# Patient Record
Sex: Female | Born: 1985
Health system: Southern US, Community
[De-identification: ages and names within clinical notes are randomized; demographics above are authoritative.]

## PROBLEM LIST (undated history)

## (undated) DIAGNOSIS — G4700 Insomnia, unspecified: Secondary | ICD-10-CM

## (undated) DIAGNOSIS — K219 Gastro-esophageal reflux disease without esophagitis: Secondary | ICD-10-CM

## (undated) DIAGNOSIS — F909 Attention-deficit hyperactivity disorder, unspecified type: Secondary | ICD-10-CM

## (undated) DIAGNOSIS — R519 Headache, unspecified: Secondary | ICD-10-CM

## (undated) DIAGNOSIS — F32A Depression, unspecified: Secondary | ICD-10-CM

## (undated) DIAGNOSIS — J45909 Unspecified asthma, uncomplicated: Secondary | ICD-10-CM

## (undated) DIAGNOSIS — F419 Anxiety disorder, unspecified: Secondary | ICD-10-CM

## (undated) HISTORY — DX: Attention-deficit hyperactivity disorder, unspecified type: F90.9

## (undated) HISTORY — DX: Anxiety disorder, unspecified: F41.9

## (undated) HISTORY — PX: WISDOM TOOTH EXTRACTION: SHX21

## (undated) HISTORY — DX: Depression, unspecified: F32.A

## (undated) HISTORY — DX: Insomnia, unspecified: G47.00

## (undated) HISTORY — DX: Unspecified asthma, uncomplicated: J45.909

## (undated) HISTORY — PX: TONSILLECTOMY AND ADENOIDECTOMY: SUR1326

## (undated) HISTORY — PX: APPENDECTOMY: SHX54

---

## 2011-10-23 ENCOUNTER — Encounter: Payer: Self-pay | Admitting: *Deleted

## 2011-11-04 ENCOUNTER — Encounter: Payer: Self-pay | Admitting: Obstetrics & Gynecology

## 2011-11-04 ENCOUNTER — Ambulatory Visit (INDEPENDENT_AMBULATORY_CARE_PROVIDER_SITE_OTHER): Payer: 59 | Admitting: Obstetrics & Gynecology

## 2011-11-04 VITALS — BP 107/66 | Temp 98.0°F | Ht 61.0 in | Wt 107.0 lb

## 2011-11-04 DIAGNOSIS — Z113 Encounter for screening for infections with a predominantly sexual mode of transmission: Secondary | ICD-10-CM

## 2011-11-04 DIAGNOSIS — Z3480 Encounter for supervision of other normal pregnancy, unspecified trimester: Secondary | ICD-10-CM

## 2011-11-04 DIAGNOSIS — Z124 Encounter for screening for malignant neoplasm of cervix: Secondary | ICD-10-CM

## 2011-11-04 NOTE — Progress Notes (Signed)
   Subjective:    Annette Cook is a G2P1001 [redacted]w[redacted]d being seen today for her first obstetrical visit.  Her obstetrical history is significant for nothing. Patient does intend to breast feed. Pregnancy history fully reviewed.  Patient reports insomnia which she treats with Unisom and benadryl.[facility]eas[facility] Mons Vitals:   11/04/11 1520 11/04/11 1526  BP: 107/66   Temp: 98 F (36.7 C)   Height:  5\' 1"  (1.549 m)  Weight: 107 lb (48.535 kg)     HISTORY: OB History    Grav Para Term Preterm Abortions TAB SAB Ect Mult Living   2 1 1       1      # Outc Date GA Lbr Len/2nd Wgt Sex Del Anes PTL Lv   1 TRM 5/09 [redacted]w[redacted]d  7lb12oz(3.515kg) M SVD EPI No Yes   2 CUR              Past Medical History  Diagnosis Date  . ADHD (attention deficit hyperactivity disorder)   . Anxiety   . Insomnia    Past Surgical History  Procedure Date  . Appendectomy   . Wisdom tooth extraction   . Tonsillectomy and adenoidectomy    Family History  Problem Relation Age of Onset  . Hypertension Mother      Exam    Uterus:     Pelvic Exam:    Perineum: No Hemorrhoids   Vulva: normal   Vagina:  normal mucosa   pH:    Cervix: anteverted   Adnexa: normal adnexa   Bony Pelvis: android  System: Breast:  normal appearance, no masses or tenderness   Skin: normal coloration and turgor, no rashes    Neurologic: normal   Extremities: normal strength, tone, and muscle mass   HEENT PERRLA   Mouth/Teeth mucous membranes moist, pharynx normal without lesions   Neck supple   Cardiovascular: regular rate and rhythm   Respiratory:  appears well, vitals normal, no respiratory distress, acyanotic, normal RR, ear and throat exam is normal, neck free of mass or lymphadenopathy, chest clear, no wheezing, crepitations, rhonchi, normal symmetric air entry   Abdomen: soft, non-tender; bowel sounds normal; no masses,  no organomegaly   Urinary: urethral meatus normal  Bedside u/s shows a single pregnancy with a FHR 150. CRL  [id number]    Assessment:    Pregnancy: G2P1001 There is no problem list on file for this patient.       Plan:     Initial labs drawn. Prenatal vitamins. Problem list reviewed and updated. Genetic Screening discussed First Screen and Quad Screen: undecided.  Ultrasound discussed; fetal survey: requested.  Follow up in 4 weeks.   Basilio Meadow C. 11/04/2011

## 2011-11-04 NOTE — Progress Notes (Signed)
p-99  This is a 26 yo who is here with her husband.  They are excited about this pregnancy.  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

## 2011-11-05 LAB — OBSTETRIC PANEL
Antibody Screen: NEGATIVE
Basophils Relative: 0 % (ref 0–1)
HCT: 39.4 % (ref 36.0–46.0)
Hemoglobin: 13.4 g/dL (ref 12.0–15.0)
Lymphs Abs: 2.5 10*3/uL (ref 0.7–4.0)
MCH: 31.7 pg (ref 26.0–34.0)
MCHC: 34 g/dL (ref 30.0–36.0)
Monocytes Absolute: 0.6 10*3/uL (ref 0.1–1.0)
Monocytes Relative: 5 % (ref 3–12)
Neutro Abs: 7.6 10*3/uL (ref 1.7–7.7)
Rh Type: NEGATIVE

## 2011-11-06 LAB — URINE CULTURE: Organism ID, Bacteria: NO GROWTH

## 2011-11-09 ENCOUNTER — Encounter: Payer: Self-pay | Admitting: Obstetrics & Gynecology

## 2011-11-09 DIAGNOSIS — Z3480 Encounter for supervision of other normal pregnancy, unspecified trimester: Secondary | ICD-10-CM | POA: Insufficient documentation

## 2011-11-13 ENCOUNTER — Telehealth: Payer: Self-pay | Admitting: *Deleted

## 2011-11-13 DIAGNOSIS — Z3480 Encounter for supervision of other normal pregnancy, unspecified trimester: Secondary | ICD-10-CM

## 2011-11-13 MED ORDER — PRENEXA 30-1.24-265 MG PO CAPS: 1.0000 | ORAL_CAPSULE | Freq: Every day | ORAL | Status: DC

## 2011-11-13 NOTE — Telephone Encounter (Signed)
Pt called requesting a prenatal vitamin that is on the [employer] insurance plan.  RX for [place] sent to [facility].

## 2011-12-02 ENCOUNTER — Encounter: Payer: Self-pay | Admitting: Obstetrics & Gynecology

## 2011-12-02 ENCOUNTER — Ambulatory Visit (INDEPENDENT_AMBULATORY_CARE_PROVIDER_SITE_OTHER): Payer: 59 | Admitting: Obstetrics & Gynecology

## 2011-12-02 ENCOUNTER — Encounter: Payer: 59 | Admitting: Obstetrics & Gynecology

## 2011-12-02 VITALS — BP 114/72 | Temp 98.0°F | Wt 113.0 lb

## 2011-12-02 DIAGNOSIS — B009 Herpesviral infection, unspecified: Secondary | ICD-10-CM | POA: Insufficient documentation

## 2011-12-02 DIAGNOSIS — Z3480 Encounter for supervision of other normal pregnancy, unspecified trimester: Secondary | ICD-10-CM

## 2011-12-02 NOTE — Progress Notes (Signed)
p=101 

## 2011-12-02 NOTE — Progress Notes (Signed)
Routine visit. No problems. We discussed First screen and she declines it. We discussed rec weight gain.

## 2012-01-01 ENCOUNTER — Encounter: Payer: 59 | Admitting: Obstetrics & Gynecology

## 2012-01-06 ENCOUNTER — Encounter: Payer: Self-pay | Admitting: Obstetrics & Gynecology

## 2012-01-06 ENCOUNTER — Ambulatory Visit (INDEPENDENT_AMBULATORY_CARE_PROVIDER_SITE_OTHER): Payer: 59 | Admitting: Obstetrics & Gynecology

## 2012-01-06 VITALS — BP 109/67 | Temp 98.4°F | Wt 119.0 lb

## 2012-01-06 DIAGNOSIS — Z3480 Encounter for supervision of other normal pregnancy, unspecified trimester: Secondary | ICD-10-CM

## 2012-01-06 DIAGNOSIS — Z23 Encounter for immunization: Secondary | ICD-10-CM

## 2012-01-06 DIAGNOSIS — B009 Herpesviral infection, unspecified: Secondary | ICD-10-CM

## 2012-01-06 NOTE — Addendum Note (Signed)
Addended by: Granville Lewis on: 01/06/2012 02:40 PM   Modules accepted: Orders

## 2012-01-06 NOTE — Progress Notes (Signed)
p-81 

## 2012-01-06 NOTE — Progress Notes (Signed)
Routine visit. No OB problems. Flu shot today. She will get her anatomy u/s in 3 weeks and her Quad screen at next visit.

## 2012-01-30 ENCOUNTER — Ambulatory Visit (HOSPITAL_COMMUNITY)
Admission: RE | Admit: 2012-01-30 | Discharge: 2012-01-30 | Disposition: A | Discharge status: Home / Self Care | Payer: 59 | Source: Ambulatory Visit | Attending: Obstetrics & Gynecology | Admitting: Obstetrics & Gynecology

## 2012-01-30 DIAGNOSIS — Z3480 Encounter for supervision of other normal pregnancy, unspecified trimester: Secondary | ICD-10-CM | POA: Insufficient documentation

## 2012-02-03 ENCOUNTER — Ambulatory Visit (INDEPENDENT_AMBULATORY_CARE_PROVIDER_SITE_OTHER): Payer: 59 | Admitting: Obstetrics & Gynecology

## 2012-02-03 ENCOUNTER — Encounter: Payer: Self-pay | Admitting: Obstetrics & Gynecology

## 2012-02-03 VITALS — BP 104/62 | Temp 97.1°F | Wt 124.0 lb

## 2012-02-03 DIAGNOSIS — O360990 Maternal care for other rhesus isoimmunization, unspecified trimester, not applicable or unspecified: Secondary | ICD-10-CM

## 2012-02-03 DIAGNOSIS — Z6791 Unspecified blood type, Rh negative: Secondary | ICD-10-CM | POA: Insufficient documentation

## 2012-02-03 DIAGNOSIS — Z3480 Encounter for supervision of other normal pregnancy, unspecified trimester: Secondary | ICD-10-CM

## 2012-02-03 DIAGNOSIS — O4420 Partial placenta previa NOS or without hemorrhage, unspecified trimester: Secondary | ICD-10-CM | POA: Insufficient documentation

## 2012-02-03 DIAGNOSIS — O4410 Placenta previa with hemorrhage, unspecified trimester: Secondary | ICD-10-CM

## 2012-02-03 DIAGNOSIS — O26899 Other specified pregnancy related conditions, unspecified trimester: Secondary | ICD-10-CM

## 2012-02-03 NOTE — Patient Instructions (Signed)
Placenta Previa Placenta previa is a condition in which the placenta has grown low in the womb (uterus). This is a condition in which the organ which connects the fetus to the mother's uterus (placenta) is low in the opening in the uterus (cervix). It can partially or completely cover the cervix. The cause of this is unknown. It is more common with multiple births or twins. SYMPTOMS  The main symptom or sign of placenta previa is vaginal bleeding. The bleeding can be mild to very heavy. This condition can be very serious for the mother and baby. Often there are no symptoms with placenta previa. Sometimes if the location of the placenta is very low it will become partially detached and cause bleeding. This may be simply a marginal sinus separation of the placenta. This is a separation of the vessels from the wall of the uterus. This may cause no further problems other than mild anxiety. There is an increase risk of intrauterine growth restriction (IUGR) with placenta previa because of the abnormal placement of the placenta. DIAGNOSIS  The diagnosis is usually made by ultrasound exam of the uterus. There may be a careful vaginal exam to see the cervix. The patient will be prepared for a Cesarean section immediately if necessary. TREATMENT  Treatment for placenta previa is usually bed rest in the hospital or at home. You may be given medication to stop contractions. Contractions can increase bleeding. Your doctor may take fluid from the baby's sac (amniocentesis) to see if the baby's lungs are mature enough for a C-section. A blood transfusion may be necessary if you have a low blood count. No further treatment may be needed when placenta previa is present in small degrees. Early placenta previa may resolve on it's own. The placenta moves higher in the birth canal as pregnancy progresses. In this case the placenta no longer is an obstruction to birth. The position of the placenta may need to be reconfirmed  during pregnancy. This can be done with an ultrasound exam of the belly(abdomen). Call your caregiver immediately if blood loss is severe. Immediate fluid or blood replacement may be necessary. With complete placenta previa, the only way to safely deliver the baby is by Cesarean section. HOME CARE INSTRUCTIONS   Follow your caregiver's advice about bed rest.  Take any iron pills or other medications your doctor gives to you.  No bending or lifting.  Do not have sexual intercourse.  Do not put anything in your vagina (tampons or vaginal creams). If you are bleeding, use sanitary pads.  Keep your doctors appointments as scheduled. Not keeping the appointment could result in a chronic or permanent injury, pain, disability and injury or death to you or your unborn baby. If there is any problem keeping the appointment, you must call back to this facility for assistance. SEEK IMMEDIATE MEDICAL CARE IF:   You have increased bleeding.  You have fainting episodes or feel lightheaded.  You develop abdominal pain.  You can no longer feel normal fetal or baby movements.  You develop uterine contractions. Document Released: 03/31/2005 Document Revised: 06/23/2011 Document Reviewed: 11/12/2007 ExitCare Patient Information 2013 [employer].  

## 2012-02-03 NOTE — Progress Notes (Signed)
p=82 

## 2012-02-13 ENCOUNTER — Encounter: Payer: Self-pay | Admitting: Obstetrics & Gynecology

## 2012-02-16 ENCOUNTER — Telehealth: Payer: Self-pay | Admitting: *Deleted

## 2012-02-16 DIAGNOSIS — O4420 Partial placenta previa NOS or without hemorrhage, unspecified trimester: Secondary | ICD-10-CM

## 2012-02-16 NOTE — Telephone Encounter (Signed)
Pt scheduled for f/u prenatal ultrasound to reevaluate marginal previa.  Scheduled appt 03/31/12 @ 2:45 pm

## 2012-03-02 ENCOUNTER — Encounter: Payer: 59 | Admitting: Obstetrics & Gynecology

## 2012-03-05 ENCOUNTER — Ambulatory Visit (INDEPENDENT_AMBULATORY_CARE_PROVIDER_SITE_OTHER): Payer: 59 | Admitting: Advanced Practice Midwife

## 2012-03-05 VITALS — BP 100/62 | Temp 98.0°F | Wt 128.0 lb

## 2012-03-05 DIAGNOSIS — Z3480 Encounter for supervision of other normal pregnancy, unspecified trimester: Secondary | ICD-10-CM

## 2012-03-05 NOTE — Progress Notes (Signed)
p-79 

## 2012-03-06 LAB — ANTIBODY SCREEN: Antibody Screen: NEGATIVE

## 2012-03-06 LAB — CBC
MCV: 94 fL (ref 78.0–100.0)
Platelets: 188 10*3/uL (ref 150–400)
RBC: 3.48 MIL/uL — ABNORMAL LOW (ref 3.87–5.11)
WBC: 7.9 10*3/uL (ref 4.0–10.5)

## 2012-03-22 NOTE — Progress Notes (Signed)
Quad screen neg

## 2012-03-31 ENCOUNTER — Ambulatory Visit (HOSPITAL_COMMUNITY)
Admission: RE | Admit: 2012-03-31 | Discharge: 2012-03-31 | Disposition: A | Discharge status: Home / Self Care | Payer: 59 | Source: Ambulatory Visit | Attending: Obstetrics & Gynecology | Admitting: Obstetrics & Gynecology

## 2012-03-31 DIAGNOSIS — O4400 Placenta previa specified as without hemorrhage, unspecified trimester: Secondary | ICD-10-CM | POA: Insufficient documentation

## 2012-03-31 DIAGNOSIS — O4420 Partial placenta previa NOS or without hemorrhage, unspecified trimester: Secondary | ICD-10-CM

## 2012-04-01 ENCOUNTER — Other Ambulatory Visit: Payer: Self-pay | Admitting: Obstetrics & Gynecology

## 2012-04-01 DIAGNOSIS — O4420 Partial placenta previa NOS or without hemorrhage, unspecified trimester: Secondary | ICD-10-CM

## 2012-04-02 ENCOUNTER — Encounter: Payer: Self-pay | Admitting: Obstetrics & Gynecology

## 2012-04-09 ENCOUNTER — Ambulatory Visit (INDEPENDENT_AMBULATORY_CARE_PROVIDER_SITE_OTHER): Payer: 59 | Admitting: Family

## 2012-04-09 VITALS — BP 117/67 | Wt 133.0 lb

## 2012-04-09 DIAGNOSIS — Z6791 Unspecified blood type, Rh negative: Secondary | ICD-10-CM

## 2012-04-09 DIAGNOSIS — O361190 Maternal care for Anti-A sensitization, unspecified trimester, not applicable or unspecified: Secondary | ICD-10-CM

## 2012-04-09 DIAGNOSIS — O26899 Other specified pregnancy related conditions, unspecified trimester: Secondary | ICD-10-CM

## 2012-04-09 DIAGNOSIS — Z3480 Encounter for supervision of other normal pregnancy, unspecified trimester: Secondary | ICD-10-CM

## 2012-04-09 DIAGNOSIS — O360990 Maternal care for other rhesus isoimmunization, unspecified trimester, not applicable or unspecified: Secondary | ICD-10-CM

## 2012-04-09 MED ORDER — CYCLOBENZAPRINE HCL 10 MG PO TABS: 10.0000 mg | ORAL_TABLET | Freq: Three times a day (TID) | ORAL | Status: DC | PRN

## 2012-04-09 MED ORDER — RHO D IMMUNE GLOBULIN 1500 UNIT/2ML IJ SOLN
300.0000 ug | Freq: Once | INTRAMUSCULAR | Status: AC
  Administered 2012-04-09: 300 ug via INTRAMUSCULAR

## 2012-04-09 MED ORDER — ZOLPIDEM TARTRATE 10 MG PO TABS: 5.0000 mg | ORAL_TABLET | Freq: Every evening | ORAL | Status: DC | PRN

## 2012-04-09 NOTE — Progress Notes (Signed)
p=90 

## 2012-04-09 NOTE — Progress Notes (Signed)
Reviewed ultrasound with pt, posterior low-lying placenta 1.8 cm from os; advised another ultrasound recommended towards end of pregnancy to check position, pt prefers not to due to deductible with insurance; consulted with Dr. Penne Lash, strongly advise follow-up ultrasound at 36 wk with no intercourse until reassessment.  Reports lower back pain > recommended physical therapy, pt requested pharmaceutical agent > flexeril.  Also requested med to assist in insomnia > Ambien 5 mg, do not take both medications at the same time > pt verbalized understanding.  Rhogam today.

## 2012-04-14 NOTE — L&D Delivery Note (Signed)
Delivery Note After a short second stage, at 5:41 PM a viable female was delivered via Vaginal, Spontaneous Delivery (Presentation:ROA;  ).  APGAR: 9, 9; weight pending.   Placenta status: Intact, Spontaneous.  Cord: 3 vessels with the following complications: None.   Anesthesia: Epidural  Episiotomy: None Lacerations: 2nd degree;Vaginal Suture Repair: 2.0 vicryl Est. Blood Loss (mL): 300  Mom to postpartum.  Baby to nursery-stable  Delivery and repair done by Leandro Reasoner, SN[facility], under my supervision.  Annette Cook,Annette Cook 06/20/2012, 5:55 PM

## 2012-05-12 ENCOUNTER — Encounter: Payer: 59 | Admitting: Obstetrics & Gynecology

## 2012-05-20 ENCOUNTER — Ambulatory Visit (INDEPENDENT_AMBULATORY_CARE_PROVIDER_SITE_OTHER): Payer: 59 | Admitting: Obstetrics & Gynecology

## 2012-05-20 ENCOUNTER — Encounter: Payer: Self-pay | Admitting: Obstetrics & Gynecology

## 2012-05-20 VITALS — BP 117/73 | Wt 139.0 lb

## 2012-05-20 DIAGNOSIS — Z3480 Encounter for supervision of other normal pregnancy, unspecified trimester: Secondary | ICD-10-CM

## 2012-05-20 MED ORDER — ZOLPIDEM TARTRATE 5 MG PO TABS: 5.0000 mg | ORAL_TABLET | Freq: Every evening | ORAL | Status: DC | PRN

## 2012-05-20 NOTE — Progress Notes (Signed)
Routine visit. Good FM. No OB problems. She would like a refill of her Ambien. She is already taking her Valtrex daily. She will keep her next visit because she knows we need to do cervical cultures. Back exercises demonstrated/taught.

## 2012-05-20 NOTE — Progress Notes (Signed)
P-95 - Pt states feels like baby has turned and would like a scan to confirm

## 2012-05-20 NOTE — Addendum Note (Signed)
Addended by: Allie Bossier on: 05/20/2012 04:01 PM   Modules accepted: Orders

## 2012-05-24 ENCOUNTER — Other Ambulatory Visit: Payer: Self-pay | Admitting: Family

## 2012-06-04 ENCOUNTER — Ambulatory Visit (INDEPENDENT_AMBULATORY_CARE_PROVIDER_SITE_OTHER): Payer: 59 | Admitting: Family

## 2012-06-04 VITALS — BP 110/71 | Wt 138.0 lb

## 2012-06-04 DIAGNOSIS — Z3480 Encounter for supervision of other normal pregnancy, unspecified trimester: Secondary | ICD-10-CM

## 2012-06-04 DIAGNOSIS — O4420 Partial placenta previa NOS or without hemorrhage, unspecified trimester: Secondary | ICD-10-CM

## 2012-06-04 DIAGNOSIS — O4410 Placenta previa with hemorrhage, unspecified trimester: Secondary | ICD-10-CM

## 2012-06-04 NOTE — Progress Notes (Signed)
P-109 

## 2012-06-04 NOTE — Progress Notes (Signed)
No problems are concerned; discussed low lying placenta (1.8 cm from os) in early pregnancy, did not repeat scan due to insurance.  Consulted with Anyanwu >ok to attempt vaginal delivery.  No report of vaginal bleeding.  GC/CT and GBS collected today.  Pt reports taking valtrex since last visit.

## 2012-06-06 LAB — GC/CHLAMYDIA PROBE AMP: GC Probe RNA: NEGATIVE

## 2012-06-07 LAB — CULTURE, BETA STREP (GROUP B ONLY)

## 2012-06-08 ENCOUNTER — Encounter: Payer: Self-pay | Admitting: Family

## 2012-06-14 ENCOUNTER — Ambulatory Visit (INDEPENDENT_AMBULATORY_CARE_PROVIDER_SITE_OTHER): Payer: 59 | Admitting: Obstetrics and Gynecology

## 2012-06-14 ENCOUNTER — Encounter: Payer: Self-pay | Admitting: Obstetrics and Gynecology

## 2012-06-14 VITALS — BP 122/76 | Wt 141.0 lb

## 2012-06-14 DIAGNOSIS — Z3480 Encounter for supervision of other normal pregnancy, unspecified trimester: Secondary | ICD-10-CM

## 2012-06-14 DIAGNOSIS — R3129 Other microscopic hematuria: Secondary | ICD-10-CM

## 2012-06-14 NOTE — Patient Instructions (Signed)
Pregnancy - Third Trimester  The third trimester of pregnancy (the last 3 months) is a period of the most rapid growth for you and your baby. The baby approaches a length of 20 inches and a weight of 6 to 10 pounds. The baby is adding on fat and getting ready for life outside your body. While inside, babies have periods of sleeping and waking, suck their thumbs, and hiccups. You can often feel small contractions of the uterus. This is false labor. It is also called Braxton-Hicks contractions. This is like a practice for labor. The usual problems in this stage of pregnancy include more difficulty breathing, swelling of the hands and feet from water retention, and having to urinate more often because of the uterus and baby pressing on your bladder.   PRENATAL EXAMS  · Blood work may continue to be done during prenatal exams. These tests are done to check on your health and the probable health of your baby. Blood work is used to follow your blood levels (hemoglobin). Anemia (low hemoglobin) is common during pregnancy. Iron and vitamins are given to help prevent this. You may also continue to be checked for diabetes. Some of the past blood tests may be done again.  · The size of the uterus is measured during each visit. This makes sure your baby is growing properly according to your pregnancy dates.  · Your blood pressure is checked every prenatal visit. This is to make sure you are not getting toxemia.  · Your urine is checked every prenatal visit for infection, diabetes and protein.  · Your weight is checked at each visit. This is done to make sure gains are happening at the suggested rate and that you and your baby are growing normally.  · Sometimes, an ultrasound is performed to confirm the position and the proper growth and development of the baby. This is a test done that bounces harmless sound waves off the baby so your caregiver can more accurately determine due dates.  · Discuss the type of pain medication and  anesthesia you will have during your labor and delivery.  · Discuss the possibility and anesthesia if a Cesarean Section might be necessary.  · Inform your caregiver if there is any mental or physical violence at home.  Sometimes, a specialized non-stress test, contraction stress test and biophysical profile are done to make sure the baby is not having a problem. Checking the amniotic fluid surrounding the baby is called an amniocentesis. The amniotic fluid is removed by sticking a needle into the belly (abdomen). This is sometimes done near the end of pregnancy if an early delivery is required. In this case, it is done to help make sure the baby's lungs are mature enough for the baby to live outside of the womb. If the lungs are not mature and it is unsafe to deliver the baby, an injection of cortisone medication is given to the mother 1 to 2 days before the delivery. This helps the baby's lungs mature and makes it safer to deliver the baby.  CHANGES OCCURING IN THE THIRD TRIMESTER OF PREGNANCY  Your body goes through many changes during pregnancy. They vary from person to person. Talk to your caregiver about changes you notice and are concerned about.  · During the last trimester, you have probably had an increase in your appetite. It is normal to have cravings for certain foods. This varies from person to person and pregnancy to pregnancy.  · You may begin to   get stretch marks on your hips, abdomen, and breasts. These are normal changes in the body during pregnancy. There are no exercises or medications to take which prevent this change.  · Constipation may be treated with a stool softener or adding bulk to your diet. Drinking lots of fluids, fiber in vegetables, fruits, and whole grains are helpful.  · Exercising is also helpful. If you have been very active up until your pregnancy, most of these activities can be continued during your pregnancy. If you have been less active, it is helpful to start an exercise  program such as walking. Consult your caregiver before starting exercise programs.  · Avoid all smoking, alcohol, un-prescribed drugs, herbs and "street drugs" during your pregnancy. These chemicals affect the formation and growth of the baby. Avoid chemicals throughout the pregnancy to ensure the delivery of a healthy infant.  · Backache, varicose veins and hemorrhoids may develop or get worse.  · You will tire more easily in the third trimester, which is normal.  · The baby's movements may be stronger and more often.  · You may become short of breath easily.  · Your belly button may stick out.  · A yellow discharge may leak from your breasts called colostrum.  · You may have a bloody mucus discharge. This usually occurs a few days to a week before labor begins.  HOME CARE INSTRUCTIONS   · Keep your caregiver's appointments. Follow your caregiver's instructions regarding medication use, exercise, and diet.  · During pregnancy, you are providing food for you and your baby. Continue to eat regular, well-balanced meals. Choose foods such as meat, fish, milk and other low fat dairy products, vegetables, fruits, and whole-grain breads and cereals. Your caregiver will tell you of the ideal weight gain.  · A physical sexual relationship may be continued throughout pregnancy if there are no other problems such as early (premature) leaking of amniotic fluid from the membranes, vaginal bleeding, or belly (abdominal) pain.  · Exercise regularly if there are no restrictions. Check with your caregiver if you are unsure of the safety of your exercises. Greater weight gain will occur in the last 2 trimesters of pregnancy. Exercising helps:  · Control your weight.  · Get you in shape for labor and delivery.  · You lose weight after you deliver.  · Rest a lot with legs elevated, or as needed for leg cramps or low back pain.  · Wear a good support or jogging bra for breast tenderness during pregnancy. This may help if worn during  sleep. Pads or tissues may be used in the bra if you are leaking colostrum.  · Do not use hot tubs, steam rooms, or saunas.  · Wear your seat belt when driving. This protects you and your baby if you are in an accident.  · Avoid raw meat, cat litter boxes and soil used by cats. These carry germs that can cause birth defects in the baby.  · It is easier to loose urine during pregnancy. Tightening up and strengthening the pelvic muscles will help with this problem. You can practice stopping your urination while you are going to the bathroom. These are the same muscles you need to strengthen. It is also the muscles you would use if you were trying to stop from passing gas. You can practice tightening these muscles up 10 times a set and repeating this about 3 times per day. Once you know what muscles to tighten up, do not perform these   exercises during urination. It is more likely to cause an infection by backing up the urine.  · Ask for help if you have financial, counseling or nutritional needs during pregnancy. Your caregiver will be able to offer counseling for these needs as well as refer you for other special needs.  · Make a list of emergency phone numbers and have them available.  · Plan on getting help from family or friends when you go home from the hospital.  · Make a trial run to the hospital.  · Take prenatal classes with the father to understand, practice and ask questions about the labor and delivery.  · Prepare the baby's room/nursery.  · Do not travel out of the city unless it is absolutely necessary and with the advice of your caregiver.  · Wear only low or no heal shoes to have better balance and prevent falling.  MEDICATIONS AND DRUG USE IN PREGNANCY  · Take prenatal vitamins as directed. The vitamin should contain 1 milligram of folic acid. Keep all vitamins out of reach of children. Only a couple vitamins or tablets containing iron may be fatal to a baby or young child when ingested.  · Avoid use  of all medications, including herbs, over-the-counter medications, not prescribed or suggested by your caregiver. Only take over-the-counter or prescription medicines for pain, discomfort, or fever as directed by your caregiver. Do not use aspirin, ibuprofen (Motrin®, Advil®, Nuprin®) or naproxen (Aleve®) unless OK'd by your caregiver.  · Let your caregiver also know about herbs you may be using.  · Alcohol is related to a number of birth defects. This includes fetal alcohol syndrome. All alcohol, in any form, should be avoided completely. Smoking will cause low birth rate and premature babies.  · Street/illegal drugs are very harmful to the baby. They are absolutely forbidden. A baby born to an addicted mother will be addicted at birth. The baby will go through the same withdrawal an adult does.  SEEK MEDICAL CARE IF:  You have any concerns or worries during your pregnancy. It is better to call with your questions if you feel they cannot wait, rather than worry about them.  DECISIONS ABOUT CIRCUMCISION  You may or may not know the sex of your baby. If you know your baby is a boy, it may be time to think about circumcision. Circumcision is the removal of the foreskin of the penis. This is the skin that covers the sensitive end of the penis. There is no proven medical need for this. Often this decision is made on what is popular at the time or based upon religious beliefs and social issues. You can discuss these issues with your caregiver or pediatrician.  SEEK IMMEDIATE MEDICAL CARE IF:   · An unexplained oral temperature above 102° F (38.9° C) develops, or as your caregiver suggests.  · You have leaking of fluid from the vagina (birth canal). If leaking membranes are suspected, take your temperature and tell your caregiver of this when you call.  · There is vaginal spotting, bleeding or passing clots. Tell your caregiver of the amount and how many pads are used.  · You develop a bad smelling vaginal discharge with  a change in the color from clear to white.  · You develop vomiting that lasts more than 24 hours.  · You develop chills or fever.  · You develop shortness of breath.  · You develop burning on urination.  · You loose more than 2 pounds of weight   or gain more than 2 pounds of weight or as suggested by your caregiver.  · You notice sudden swelling of your face, hands, and feet or legs.  · You develop belly (abdominal) pain. Round ligament discomfort is a common non-cancerous (benign) cause of abdominal pain in pregnancy. Your caregiver still must evaluate you.  · You develop a severe headache that does not go away.  · You develop visual problems, blurred or double vision.  · If you have not felt your baby move for more than 1 hour. If you think the baby is not moving as much as usual, eat something with sugar in it and lie down on your left side for an hour. The baby should move at least 4 to 5 times per hour. Call right away if your baby moves less than that.  · You fall, are in a car accident or any kind of trauma.  · There is mental or physical violence at home.  Document Released: 03/25/2001 Document Revised: 06/23/2011 Document Reviewed: 09/27/2008  Exit[employer]® Patient Information ©2013 [employer].

## 2012-06-14 NOTE — Progress Notes (Signed)
Doing wel. Cultures neg. Still working FT. No UTI sx. No VB. Urine C&S.

## 2012-06-14 NOTE — Progress Notes (Signed)
p-94  Blood -mod

## 2012-06-15 LAB — URINE CULTURE

## 2012-06-20 ENCOUNTER — Encounter (HOSPITAL_COMMUNITY): Payer: Self-pay | Admitting: *Deleted

## 2012-06-20 ENCOUNTER — Inpatient Hospital Stay (HOSPITAL_COMMUNITY)
Admission: AD | Admit: 2012-06-20 | Discharge: 2012-06-21 | DRG: 774 | Disposition: A | Discharge status: Home / Self Care | Payer: 59 | Source: Ambulatory Visit | Attending: Obstetrics & Gynecology | Admitting: Obstetrics & Gynecology

## 2012-06-20 ENCOUNTER — Encounter (HOSPITAL_COMMUNITY): Payer: Self-pay | Admitting: Anesthesiology

## 2012-06-20 ENCOUNTER — Inpatient Hospital Stay (HOSPITAL_COMMUNITY): Payer: 59 | Admitting: Anesthesiology

## 2012-06-20 DIAGNOSIS — O4290 Premature rupture of membranes, unspecified as to length of time between rupture and onset of labor, unspecified weeks of gestation: Secondary | ICD-10-CM

## 2012-06-20 DIAGNOSIS — O360131 Maternal care for anti-D [Rh] antibodies, third trimester, fetus 1: Secondary | ICD-10-CM

## 2012-06-20 DIAGNOSIS — A6000 Herpesviral infection of urogenital system, unspecified: Secondary | ICD-10-CM | POA: Diagnosis present

## 2012-06-20 DIAGNOSIS — O4410 Placenta previa with hemorrhage, unspecified trimester: Secondary | ICD-10-CM

## 2012-06-20 DIAGNOSIS — O98519 Other viral diseases complicating pregnancy, unspecified trimester: Secondary | ICD-10-CM

## 2012-06-20 LAB — CBC
HCT: 38.5 % (ref 36.0–46.0)
MCV: 95.8 fL (ref 78.0–100.0)
RDW: 13.8 % (ref 11.5–15.5)
WBC: 9.8 10*3/uL (ref 4.0–10.5)

## 2012-06-20 MED ORDER — TERBUTALINE SULFATE 1 MG/ML IJ SOLN: 0.2500 mg | Freq: Once | INTRAMUSCULAR | Status: DC | PRN

## 2012-06-20 MED ORDER — PRENATAL MULTIVITAMIN CH
1.0000 | ORAL_TABLET | Freq: Every day | ORAL | Status: DC
Start: 2012-06-21 — End: 2012-06-21
  Administered 2012-06-21: 1 via ORAL
  Filled 2012-06-20: qty 1

## 2012-06-20 MED ORDER — DIPHENHYDRAMINE HCL 25 MG PO CAPS: 25.0000 mg | ORAL_CAPSULE | Freq: Four times a day (QID) | ORAL | Status: DC | PRN

## 2012-06-20 MED ORDER — TETANUS-DIPHTH-ACELL PERTUSSIS 5-2.5-18.5 LF-MCG/0.5 IM SUSP: 0.5000 mL | Freq: Once | INTRAMUSCULAR | Status: DC

## 2012-06-20 MED ORDER — EPHEDRINE 5 MG/ML INJ
10.0000 mg | INTRAVENOUS | Status: DC | PRN
  Filled 2012-06-20: qty 4

## 2012-06-20 MED ORDER — FENTANYL 2.5 MCG/ML BUPIVACAINE 1/10 % EPIDURAL INFUSION (WH - ANES)
14.0000 mL/h | INTRAMUSCULAR | Status: DC
  Administered 2012-06-20: 14 mL/h via EPIDURAL
  Filled 2012-06-20: qty 125

## 2012-06-20 MED ORDER — OXYTOCIN 40 UNITS IN LACTATED RINGERS INFUSION - SIMPLE MED: 62.5000 mL/h | INTRAVENOUS | Status: DC

## 2012-06-20 MED ORDER — LACTATED RINGERS IV SOLN: 500.0000 mL | INTRAVENOUS | Status: DC | PRN

## 2012-06-20 MED ORDER — IBUPROFEN 600 MG PO TABS
600.0000 mg | ORAL_TABLET | Freq: Four times a day (QID) | ORAL | Status: DC
  Administered 2012-06-20 – 2012-06-21 (×4): 600 mg via ORAL
  Filled 2012-06-20 (×4): qty 1

## 2012-06-20 MED ORDER — IBUPROFEN 600 MG PO TABS: 600.0000 mg | ORAL_TABLET | Freq: Four times a day (QID) | ORAL | Status: DC | PRN

## 2012-06-20 MED ORDER — EPHEDRINE 5 MG/ML INJ: 10.0000 mg | INTRAVENOUS | Status: DC | PRN

## 2012-06-20 MED ORDER — CITRIC ACID-SODIUM CITRATE 334-500 MG/5ML PO SOLN: 30.0000 mL | ORAL | Status: DC | PRN

## 2012-06-20 MED ORDER — OXYCODONE-ACETAMINOPHEN 5-325 MG PO TABS: 1.0000 | ORAL_TABLET | ORAL | Status: DC | PRN

## 2012-06-20 MED ORDER — LACTATED RINGERS IV SOLN: 500.0000 mL | Freq: Once | INTRAVENOUS | Status: DC

## 2012-06-20 MED ORDER — LIDOCAINE HCL (PF) 1 % IJ SOLN
30.0000 mL | INTRAMUSCULAR | Status: DC | PRN
  Filled 2012-06-20: qty 30

## 2012-06-20 MED ORDER — DIBUCAINE 1 % RE OINT
1.0000 "application " | TOPICAL_OINTMENT | RECTAL | Status: DC | PRN
  Filled 2012-06-20: qty 28

## 2012-06-20 MED ORDER — BENZOCAINE-MENTHOL 20-0.5 % EX AERO
1.0000 "application " | INHALATION_SPRAY | CUTANEOUS | Status: DC | PRN
  Administered 2012-06-20: 1 via TOPICAL
  Filled 2012-06-20: qty 56

## 2012-06-20 MED ORDER — LIDOCAINE HCL (PF) 1 % IJ SOLN
INTRAMUSCULAR | Status: DC | PRN
  Administered 2012-06-20 (×2): 5 mL

## 2012-06-20 MED ORDER — SIMETHICONE 80 MG PO CHEW: 80.0000 mg | CHEWABLE_TABLET | ORAL | Status: DC | PRN

## 2012-06-20 MED ORDER — ACETAMINOPHEN 325 MG PO TABS: 650.0000 mg | ORAL_TABLET | ORAL | Status: DC | PRN

## 2012-06-20 MED ORDER — ONDANSETRON HCL 4 MG/2ML IJ SOLN: 4.0000 mg | INTRAMUSCULAR | Status: DC | PRN

## 2012-06-20 MED ORDER — SENNOSIDES-DOCUSATE SODIUM 8.6-50 MG PO TABS
2.0000 | ORAL_TABLET | Freq: Every day | ORAL | Status: DC
  Administered 2012-06-20: 2 via ORAL

## 2012-06-20 MED ORDER — DIPHENHYDRAMINE HCL 50 MG/ML IJ SOLN: 12.5000 mg | INTRAMUSCULAR | Status: DC | PRN

## 2012-06-20 MED ORDER — LANOLIN HYDROUS EX OINT: TOPICAL_OINTMENT | CUTANEOUS | Status: DC | PRN

## 2012-06-20 MED ORDER — PHENYLEPHRINE 40 MCG/ML (10ML) SYRINGE FOR IV PUSH (FOR BLOOD PRESSURE SUPPORT)
80.0000 ug | PREFILLED_SYRINGE | INTRAVENOUS | Status: DC | PRN
  Filled 2012-06-20: qty 5

## 2012-06-20 MED ORDER — PHENYLEPHRINE 40 MCG/ML (10ML) SYRINGE FOR IV PUSH (FOR BLOOD PRESSURE SUPPORT): 80.0000 ug | PREFILLED_SYRINGE | INTRAVENOUS | Status: DC | PRN

## 2012-06-20 MED ORDER — WITCH HAZEL-GLYCERIN EX PADS
1.0000 "application " | MEDICATED_PAD | CUTANEOUS | Status: DC | PRN
  Administered 2012-06-21: 1 via TOPICAL

## 2012-06-20 MED ORDER — ZOLPIDEM TARTRATE 5 MG PO TABS: 5.0000 mg | ORAL_TABLET | Freq: Every evening | ORAL | Status: DC | PRN

## 2012-06-20 MED ORDER — LACTATED RINGERS IV SOLN
INTRAVENOUS | Status: DC
  Administered 2012-06-20 (×3): via INTRAVENOUS

## 2012-06-20 MED ORDER — ONDANSETRON HCL 4 MG/2ML IJ SOLN: 4.0000 mg | Freq: Four times a day (QID) | INTRAMUSCULAR | Status: DC | PRN

## 2012-06-20 MED ORDER — OXYTOCIN 40 UNITS IN LACTATED RINGERS INFUSION - SIMPLE MED
1.0000 m[IU]/min | INTRAVENOUS | Status: DC
  Administered 2012-06-20: 2 m[IU]/min via INTRAVENOUS
  Filled 2012-06-20: qty 1000

## 2012-06-20 MED ORDER — ONDANSETRON HCL 4 MG PO TABS: 4.0000 mg | ORAL_TABLET | ORAL | Status: DC | PRN

## 2012-06-20 MED ORDER — OXYTOCIN BOLUS FROM INFUSION
500.0000 mL | INTRAVENOUS | Status: DC
  Administered 2012-06-20: 500 mL via INTRAVENOUS

## 2012-06-20 NOTE — MAU Note (Signed)
Pt reports water broke at 6 am  Fluid clear with some bloody shoe. Denies having contractions at this time and reports good fetal movement

## 2012-06-20 NOTE — Progress Notes (Signed)
   Javia Dillow is a 27 y.o. G2P1001 at [redacted]w[redacted]d  admitted for PROM  Subjective:  Comfortable with epidural Objective: BP 116/67  Pulse 82  Temp(Src) 98.3 F (36.8 C) (Oral)  Resp 20  Ht 5\' 1"  (1.549 m)  Wt 64.411 kg (142 lb)  BMI 26.84 kg/m2  SpO2 100%  LMP 09/17/2011    FHT:  FHR: 130 bpm, variability: moderate,  accelerations:  Present,  decelerations:  Absent UC:   regular, every 2-3 minutes SVE:   Dilation: 3 Effacement (%): 60 Station: -2 Exam by:: Clearence Cheek RN Pitocin @ 2 mu/min  Labs: Lab Results  Component Value Date   WBC 9.8 06/20/2012   HGB 13.1 06/20/2012   HCT 38.5 06/20/2012   MCV 95.8 06/20/2012   PLT 139* 06/20/2012    Assessment / Plan: Augmentation of labor, progressing well  Labor: Progressing normally Fetal Wellbeing:  Category I Pain Control:  Epidural Anticipated MOD:  NSVD  CRESENZO-DISHMAN,FRANCES 06/20/2012, 12:53 PM

## 2012-06-20 NOTE — Anesthesia Procedure Notes (Signed)
Epidural Patient location during procedure: OB  Staffing Anesthesiologist: CARIGNAN, PETER Performed by: anesthesiologist   Preanesthetic Checklist Completed: patient identified, site marked, surgical consent, pre-op evaluation, timeout performed, IV checked, risks and benefits discussed and monitors and equipment checked  Epidural Patient position: sitting Prep: ChloraPrep Patient monitoring: heart rate, continuous pulse ox and blood pressure Approach: right paramedian Injection technique: LOR saline  Needle:  Needle type: Tuohy  Needle gauge: 17 G Needle length: 9 cm and 9 Catheter type: closed end flexible Catheter size: 20 Guage Test dose: negative  Assessment Events: blood not aspirated, injection not painful, no injection resistance, negative IV test and no paresthesia  Additional Notes   Patient tolerated the insertion well without complications.   

## 2012-06-20 NOTE — MAU Note (Signed)
Pt reports leaking of clear fluid @ 0600 this morning. Contractions started about an hour later.

## 2012-06-20 NOTE — Progress Notes (Signed)
   Annette Cook is a 27 y.o. G2P1001 at [redacted]w[redacted]d  admitted for rupture of membranes  Subjective:  Comfortable; feels some pressure Objective: BP 99/61  Pulse 81  Temp(Src) 97.5 F (36.4 C) (Oral)  Resp 20  Ht 5\' 1"  (1.549 m)  Wt 64.411 kg (142 lb)  BMI 26.84 kg/m2  SpO2 100%  LMP 09/17/2011    FHT:  FHR: 140 bpm, variability: moderate,  accelerations:  Present,  decelerations:  Absent UC:   regular, every 2-3 minutes SVE:   Dilation: 5.5 Effacement (%): 90 Station: -1 Exam by:: Clearence Cheek RN Pitocin @ 2 mu/min  Labs: Lab Results  Component Value Date   WBC 9.8 06/20/2012   HGB 13.1 06/20/2012   HCT 38.5 06/20/2012   MCV 95.8 06/20/2012   PLT 139* 06/20/2012    Assessment / Plan: Augmentation of labor, progressing well  Labor: Progressing normally Fetal Wellbeing:  Category I Pain Control:  Epidural Anticipated MOD:  NSVD  Cook,Annette Hogue 06/20/2012, 2:07 PM

## 2012-06-20 NOTE — H&P (Signed)
Annette Cook is a 27 y.o. female G2P1001 at [redacted]w[redacted]d presenting for leaking fluid since 0600 and mild, irregular contractions. Large amount clear fluid visualized gushing from vagina, swabbed and was fern positive.  History of low lying placenta at 28 weeks, was cleared by Dr Macon Large to attempt vaginal delivery. Is having no vaginal bleeding today.  History of HSV 2, taking Valtrex, no lesions or prodromal symptoms   Maternal Medical History:  Reason for admission: Rupture of membranes and contractions.  Nausea.  Contractions: Onset was 3-5 hours ago.   Frequency: irregular.   Duration is approximately 1 minute.   Perceived severity is mild.    Fetal activity: Perceived fetal activity is normal.   Last perceived fetal movement was within the past hour.    Prenatal complications: Placental abnormality.   No bleeding or pre-eclampsia.   Low lying placenta  Prenatal Complications - Diabetes: none.    OB History   Grav Para Term Preterm Abortions TAB SAB Ect Mult Living   2 1 1       1      Past Medical History  Diagnosis Date  . ADHD (attention deficit hyperactivity disorder)   . Anxiety   . Insomnia    Past Surgical History  Procedure Laterality Date  . Appendectomy    . Wisdom tooth extraction    . Tonsillectomy and adenoidectomy     Family History: family history includes Hypertension in her mother. Social History:  reports that she quit smoking about 7 years ago. Her smoking use included Cigarettes. She has a 7 pack-year smoking history. She does not have any smokeless tobacco history on file. She reports that  drinks alcohol. She reports that she does not use illicit drugs.   Prenatal Transfer Tool  Maternal Diabetes: No Genetic Screening: Normal Maternal Ultrasounds/Referrals: Abnormal:  Findings:   Other: Fetal Ultrasounds or other Referrals:  None Maternal Substance Abuse:  No Significant Maternal Medications:  Meds include: Other:  Significant Maternal Lab  Results:  None Other Comments:  Low lying placenta at 28 weeks. On Valtrex for hx HSV  Review of Systems  Constitutional: Negative for fever and chills.  Eyes: Negative for blurred vision.  Respiratory: Negative for cough.   Gastrointestinal: Negative for nausea and vomiting.  Neurological: Negative for headaches.    Dilation: 1.5 Effacement (%): 70 Station: -2 Exam by:: Rose Heide Blood pressure 117/70, pulse 88, temperature 97.6 F (36.4 C), temperature source Oral, resp. rate 18, height 5\' 1"  (1.549 m), weight 64.683 kg (142 lb 9.6 oz), last menstrual period 09/17/2011. Maternal Exam:  Uterine Assessment: Contraction strength is mild.  Contraction duration is 1 minute. Contraction frequency is irregular.   Abdomen: Fetal presentation: vertex  Introitus: Vulva is negative for lesion.  Cervix: Cervix evaluated by digital exam.     Fetal Exam Fetal Monitor Review: Mode: ultrasound.   Baseline rate: 140.  Variability: moderate (6-25 bpm).   Pattern: accelerations present.    Fetal State Assessment: Category I - tracings are normal.     Physical Exam  Constitutional: She is oriented to person, place, and time. She appears well-developed and well-nourished. No distress.  Cardiovascular: Normal rate and regular rhythm.   Respiratory: Effort normal and breath sounds normal. No respiratory distress.  GI: Soft. There is no tenderness.  Genitourinary: Vagina normal. Vulva exhibits no lesion.  1-2/75/-2  Musculoskeletal: She exhibits no edema.  Neurological: She is alert and oriented to person, place, and time.  Skin: Skin is warm and dry.  Psychiatric: She has a normal mood and affect. Her behavior is normal.    Prenatal labs: ABO, Rh: A/NEG/-- (07/23 1611) Antibody: NEG (11/22 1140) Rubella: 128.3 (07/23 1611) RPR: NON REAC (11/22 1140)  HBsAg: NEGATIVE (07/23 1611)  HIV: NON REACTIVE (11/22 1140)  GBS:     Assessment/Plan: 1. G2P1001 at 39.4 with ruptured  membranes and latent labor 2. GBS negative 3. History of HSV 2 4. Low lying placenta at 28 weeks  -admit to labor and delivery, start Pitocin for labor augmentation. Epidural as desired -taking Valtrex, no lesions or prodromal symptoms present -cleared by Dr Macon Large prenatally to attempt vaginal delivery. Dr Despina Hidden here today and aware of history, and agrees with plan to attempt vaginal delivery   [name] 06/20/2012, 10:17 AM

## 2012-06-21 NOTE — Anesthesia Postprocedure Evaluation (Signed)
  Anesthesia Post-op Note  Patient: Annette Cook  Procedure(s) Performed: * No procedures listed *  Patient Location: PACU and Mother/Baby  Anesthesia Type:Epidural  Level of Consciousness: awake, alert  and oriented  Airway and Oxygen Therapy: Patient Spontanous Breathing  Post-op Pain: mild  Post-op Assessment: Patient's Cardiovascular Status Stable, Respiratory Function Stable, No signs of Nausea or vomiting, Adequate PO intake, Pain level controlled, No headache, No backache, No residual numbness and No residual motor weakness  Post-op Vital Signs: stable  Complications: No apparent anesthesia complications

## 2012-06-21 NOTE — Progress Notes (Signed)
Patient was referred for history of depression/anxiety.  * Referral screened out by Clinical Social Worker because none of the following criteria appear to apply:  ~ History of anxiety/depression during this pregnancy, or of post-partum depression.  ~ Diagnosis of anxiety and/or depression within last 3 years  ~ History of depression due to pregnancy loss/loss of child  OR  * Patient's symptoms currently being treated with medication and/or therapy.  Please contact the Clinical Social Worker if needs arise, or by the patient's request.  Pt anxiety symptoms are situational (work related). She is appropriate & doing well with the infant, as per RN.       

## 2012-06-21 NOTE — Discharge Summary (Signed)
Annette Cook is a 27 year old G2P2002 s/p SVD on 06/20/12 at 1741 who desires discharge today on PPD#1 Obstetric Discharge Summary Reason for Admission: onset of labor and rupture of membranes Prenatal Procedures: ultrasound Intrapartum Procedures: spontaneous vaginal delivery Postpartum Procedures: Rho(D) Ig Complications-Operative and Postpartum: 2nd degree perineal laceration Hemoglobin  Date Value Range Status  06/20/2012 13.1  12.0 - 15.0 g/dL Final     HCT  Date Value Range Status  06/20/2012 38.5  36.0 - 46.0 % Final    Physical Exam:  General: alert, cooperative and no distress Lochia: appropriate Uterine Fundus: firm DVT Evaluation: No evidence of DVT seen on physical exam. Negative Homan's sign. No cords or calf tenderness. No significant calf/ankle edema.  Discharge Diagnoses: Term Pregnancy-delivered  Discharge Information: Date: 06/21/2012 Activity: pelvic rest Diet: routine Medications: Ibuprofen Condition: stable Instructions: refer to practice specific booklet Discharge to: home   Newborn Data: Live born female  Birth Weight: 7 lb 7.2 oz (3380 g) APGAR: 9, 9  Home with husband.  [name] 06/21/2012, 7:17 AM  I have seen and examined this patient and agree the above assessment. CRESENZO-DISHMAN,Zeinab Rodwell 06/21/2012 7:47 AM

## 2012-06-22 ENCOUNTER — Encounter: Payer: 59 | Admitting: Obstetrics & Gynecology

## 2012-06-23 NOTE — Anesthesia Preprocedure Evaluation (Signed)

## 2012-06-30 ENCOUNTER — Telehealth (HOSPITAL_COMMUNITY): Payer: Self-pay | Admitting: *Deleted

## 2012-06-30 NOTE — Telephone Encounter (Signed)
Resolve episode 

## 2012-07-27 ENCOUNTER — Telehealth: Payer: Self-pay | Admitting: *Deleted

## 2012-07-27 NOTE — Telephone Encounter (Signed)
Called pt to adv FMLA papers are ready to be picked up at front desk - [facility]

## 2012-08-02 ENCOUNTER — Ambulatory Visit: Payer: 59 | Admitting: Advanced Practice Midwife

## 2012-08-11 ENCOUNTER — Ambulatory Visit (INDEPENDENT_AMBULATORY_CARE_PROVIDER_SITE_OTHER): Payer: 59 | Admitting: Advanced Practice Midwife

## 2012-08-11 ENCOUNTER — Encounter: Payer: Self-pay | Admitting: Advanced Practice Midwife

## 2012-08-11 DIAGNOSIS — G4700 Insomnia, unspecified: Secondary | ICD-10-CM

## 2012-08-11 MED ORDER — ZOLPIDEM TARTRATE 10 MG PO TABS: 10.0000 mg | ORAL_TABLET | Freq: Every evening | ORAL | Status: DC | PRN

## 2012-08-11 NOTE — Progress Notes (Signed)
Patient ID: Annette Cook, female   DOB: 23-Apr-1985, 27 y.o.   MRN: [id number] Subjective:     Annette Cook is a 27 y.o. female who presents for a postpartum visit. She is 7 weeks postpartum following a spontaneous vaginal delivery. I have fully reviewed the prenatal and intrapartum course. The delivery was at term. Outcome: spontaneous vaginal delivery.  Postpartum course has been uncomplicated. Baby's course has been normal. Baby is feeding by breast. Bleeding no bleeding. Bowel function is normal. Bladder function is normal. Patient is sexually active. Contraception method is coitus interruptus. Postpartum depression screening: negative.  She reports some ongoing difficulty sleeping, using benadryl and melatonin.   The following portions of the patient's history were reviewed and updated as appropriate: allergies, current medications, past family history, past medical history, past social history, past surgical history and problem list.  Review of Systems A comprehensive review of systems was negative.   Objective:    BP 111/71  Resp 14  Ht 5\' 1"  (1.549 m)  Wt 121 lb (54.885 kg)  BMI 22.87 kg/m2  Breastfeeding? Yes  General:  alert and no distress     Lungs: normal rate and effort  Heart:  normal rate  Abdomen: soft, non-tender; bowel sounds normal; no masses,  no organomegaly   Vulva:  normal  Vagina: normal vagina and 2nd degree lac well healed     Corpus: normal              Assessment:     7 weekpostpartum exam. Pap smear not done at today's visit.   Plan:    1. Contraception: coitus interruptus, discussed other methods safe with breastfeeding, pt declines at this time 2. Insomnia - rx ambien - rev'd on lactmed, safe for breastfeeding 3. Follow up in: 1 year for annual or as needed. Return if another form of contraceptive is desired.

## 2012-08-11 NOTE — Patient Instructions (Signed)
Contraception Choices  Contraception (birth control) is the use of any methods or devices to prevent pregnancy. Below are some methods to help avoid pregnancy.  HORMONAL METHODS   · Contraceptive implant. This is a thin, plastic tube containing progesterone hormone. It does not contain estrogen hormone. Your caregiver inserts the tube in the inner part of the upper arm. The tube can remain in place for up to 3 years. After 3 years, the implant must be removed. The implant prevents the ovaries from releasing an egg (ovulation), thickens the cervical mucus which prevents sperm from entering the uterus, and thins the lining of the inside of the uterus.  · Progesterone-only injections. These injections are given every 3 months by your caregiver to prevent pregnancy. This synthetic progesterone hormone stops the ovaries from releasing eggs. It also thickens cervical mucus and changes the uterine lining. This makes it harder for sperm to survive in the uterus.  · Birth control pills. These pills contain estrogen and progesterone hormone. They work by stopping the egg from forming in the ovary (ovulation). Birth control pills are prescribed by a caregiver. Birth control pills can also be used to treat heavy periods.  · Minipill. This type of birth control pill contains only the progesterone hormone. They are taken every day of each month and must be prescribed by your caregiver.  · Birth control patch. The patch contains hormones similar to those in birth control pills. It must be changed once a week and is prescribed by a caregiver.  · Vaginal ring. The ring contains hormones similar to those in birth control pills. It is left in the vagina for 3 weeks, removed for 1 week, and then a new one is put back in place. The patient must be comfortable inserting and removing the ring from the vagina. A caregiver's prescription is necessary.  · Emergency contraception. Emergency contraceptives prevent pregnancy after unprotected  sexual intercourse. This pill can be taken right after sex or up to 5 days after unprotected sex. It is most effective the sooner you take the pills after having sexual intercourse. Emergency contraceptive pills are available without a prescription. Check with your pharmacist. Do not use emergency contraception as your only form of birth control.  BARRIER METHODS   · Female condom. This is a thin sheath (latex or rubber) that is worn over the penis during sexual intercourse. It can be used with spermicide to increase effectiveness.  · Female condom. This is a soft, loose-fitting sheath that is put into the vagina before sexual intercourse.  · Diaphragm. This is a soft, latex, dome-shaped barrier that must be fitted by a caregiver. It is inserted into the vagina, along with a spermicidal jelly. It is inserted before intercourse. The diaphragm should be left in the vagina for 6 to 8 hours after intercourse.  · Cervical cap. This is a round, soft, latex or plastic cup that fits over the cervix and must be fitted by a caregiver. The cap can be left in place for up to 48 hours after intercourse.  · Sponge. This is a soft, circular piece of polyurethane foam. The sponge has spermicide in it. It is inserted into the vagina after wetting it and before sexual intercourse.  · Spermicides. These are chemicals that kill or block sperm from entering the cervix and uterus. They come in the form of creams, jellies, suppositories, foam, or tablets. They do not require a prescription. They are inserted into the vagina with an applicator before having sexual intercourse.   The process must be repeated every time you have sexual intercourse.  INTRAUTERINE CONTRACEPTION  · Intrauterine device (IUD). This is a T-shaped device that is put in a woman's uterus during a menstrual period to prevent pregnancy. There are 2 types:  · Copper IUD. This type of IUD is wrapped in copper wire and is placed inside the uterus. Copper makes the uterus and  fallopian tubes produce a fluid that kills sperm. It can stay in place for 10 years.  · Hormone IUD. This type of IUD contains the hormone progestin (synthetic progesterone). The hormone thickens the cervical mucus and prevents sperm from entering the uterus, and it also thins the uterine lining to prevent implantation of a fertilized egg. The hormone can weaken or kill the sperm that get into the uterus. It can stay in place for 5 years.  PERMANENT METHODS OF CONTRACEPTION  · Female tubal ligation. This is when the woman's fallopian tubes are surgically sealed, tied, or blocked to prevent the egg from traveling to the uterus.  · Female sterilization. This is when the female has the tubes that carry sperm tied off (vasectomy). This blocks sperm from entering the vagina during sexual intercourse. After the procedure, the man can still ejaculate fluid (semen).  NATURAL PLANNING METHODS  · Natural family planning. This is not having sexual intercourse or using a barrier method (condom, diaphragm, cervical cap) on days the woman could become pregnant.  · Calendar method. This is keeping track of the length of each menstrual cycle and identifying when you are fertile.  · Ovulation method. This is avoiding sexual intercourse during ovulation.  · Symptothermal method. This is avoiding sexual intercourse during ovulation, using a thermometer and ovulation symptoms.  · Post-ovulation method. This is timing sexual intercourse after you have ovulated.  Regardless of which type or method of contraception you choose, it is important that you use condoms to protect against the transmission of sexually transmitted diseases (STDs). Talk with your caregiver about which form of contraception is most appropriate for you.  Document Released: 03/31/2005 Document Revised: 06/23/2011 Document Reviewed: 08/07/2010  [employer]® Patient Information ©2013 [employer].

## 2012-10-08 ENCOUNTER — Ambulatory Visit: Payer: 59 | Admitting: Family Medicine

## 2012-10-20 ENCOUNTER — Ambulatory Visit (INDEPENDENT_AMBULATORY_CARE_PROVIDER_SITE_OTHER): Payer: 59 | Admitting: Physician Assistant

## 2012-10-20 ENCOUNTER — Encounter: Payer: Self-pay | Admitting: Physician Assistant

## 2012-10-20 VITALS — BP 121/74 | HR 97 | Wt 123.0 lb

## 2012-10-20 DIAGNOSIS — F909 Attention-deficit hyperactivity disorder, unspecified type: Secondary | ICD-10-CM

## 2012-10-20 DIAGNOSIS — G4700 Insomnia, unspecified: Secondary | ICD-10-CM

## 2012-10-20 DIAGNOSIS — F411 Generalized anxiety disorder: Secondary | ICD-10-CM

## 2012-10-20 DIAGNOSIS — F410 Panic disorder [episodic paroxysmal anxiety] without agoraphobia: Secondary | ICD-10-CM

## 2012-10-20 MED ORDER — CLONAZEPAM 0.5 MG PO TABS: 0.5000 mg | ORAL_TABLET | Freq: Two times a day (BID) | ORAL | Status: DC | PRN

## 2012-10-20 MED ORDER — ZOLPIDEM TARTRATE 10 MG PO TABS: 10.0000 mg | ORAL_TABLET | Freq: Every day | ORAL | Status: DC

## 2012-10-20 NOTE — Patient Instructions (Addendum)
Call with Celexa or Wellbutrin choice.

## 2012-10-22 ENCOUNTER — Encounter: Payer: Self-pay | Admitting: Physician Assistant

## 2012-10-22 DIAGNOSIS — G4700 Insomnia, unspecified: Secondary | ICD-10-CM | POA: Insufficient documentation

## 2012-10-22 DIAGNOSIS — F410 Panic disorder [episodic paroxysmal anxiety] without agoraphobia: Secondary | ICD-10-CM | POA: Insufficient documentation

## 2012-10-22 DIAGNOSIS — F411 Generalized anxiety disorder: Secondary | ICD-10-CM | POA: Insufficient documentation

## 2012-10-22 DIAGNOSIS — F909 Attention-deficit hyperactivity disorder, unspecified type: Secondary | ICD-10-CM | POA: Insufficient documentation

## 2012-10-22 NOTE — Progress Notes (Signed)
  Subjective:    Patient ID: Annette Cook, female    DOB: 05-05-1985, 27 y.o.   MRN: [id number]  HPI Patient is a 27 year old female who presents to the clinic to establish care. She does have a past medical history for herpes, insomnia, anxiety. She's currently breast-feeding. vaccines are up to date and she works for [employer] in [place]. Last complete physical was 2012 were fasting labs were done. She has had an appendectomy in 1994. She does have a family history of depression, high blood pressure. She is a former smoker.  Patient is under a lot of stress at work and having a lot of difficulty sleeping at night. She has been taking Ambien since being pregnant and has worked great but recently even Ambien has not completely helped her get to sleep. She admits that her brain keeps going and going about the days events when she tries to go to sleep. She would like a refill today on Ambien. Patient is under a lot of constant stress at work. In the past she is taken klonapin and has helped her get through the morning. She feels like she is having mild panic at work where she gets really sweaty and almost feels SOB. She denies any depression.   Review of Systems  All other systems reviewed and are negative.       Objective:   Physical Exam  Constitutional: She is oriented to person, place, and time. She appears well-developed and well-nourished.  HENT:  Head: Normocephalic and atraumatic.  Cardiovascular: Normal rate, regular rhythm and normal heart sounds.   Pulmonary/Chest: Effort normal and breath sounds normal. She has no wheezes.  Neurological: She is alert and oriented to person, place, and time.  Skin: Skin is warm and dry.  Psychiatric: She has a normal mood and affect. Her behavior is normal.          Assessment & Plan:  Anxiety/panic attacks/insomnia- I think that an anti anxiety medication might help patient with sleep and not needed klonapin as much for panic  attacks. She wants to consult her lactating specialist and will get back to me. I suggest celexa or wellbutrin. She will get back to me. Ambien and klonapin were refilled. Follow up in 6 weeks after starting medication. Encouraged counseling and stress management.   Needs CPE in near future.    Spent 30 minutes with patient and greater than 50 percent of visit was spent counseling patient regarding anxiety/panic attacks and medications.

## 2012-11-01 ENCOUNTER — Other Ambulatory Visit: Payer: Self-pay | Admitting: Physician Assistant

## 2012-11-01 MED ORDER — CITALOPRAM HYDROBROMIDE 10 MG PO TABS: 10.0000 mg | ORAL_TABLET | Freq: Every day | ORAL | Status: DC

## 2012-11-01 MED ORDER — VALACYCLOVIR HCL 500 MG PO TABS: 500.0000 mg | ORAL_TABLET | Freq: Every day | ORAL | Status: DC

## 2012-11-01 NOTE — Progress Notes (Signed)
Pt called and let me know she wanted to try celexa.

## 2012-11-25 ENCOUNTER — Telehealth: Payer: Self-pay | Admitting: *Deleted

## 2012-11-25 NOTE — Telephone Encounter (Signed)
Pt calls today asking for a refill on her clonazepam.  She has a f/u with you already scheduled.  Are you ok with refilling it? Please advise

## 2012-11-26 ENCOUNTER — Other Ambulatory Visit: Payer: Self-pay | Admitting: *Deleted

## 2012-11-26 MED ORDER — CLONAZEPAM 0.5 MG PO TABS: 0.5000 mg | ORAL_TABLET | Freq: Two times a day (BID) | ORAL | Status: DC | PRN

## 2012-11-26 NOTE — Telephone Encounter (Signed)
Yes ok to refill for this month.

## 2012-11-26 NOTE — Telephone Encounter (Signed)
rx in your basket

## 2012-12-02 ENCOUNTER — Other Ambulatory Visit: Payer: Self-pay | Admitting: Obstetrics & Gynecology

## 2012-12-07 ENCOUNTER — Other Ambulatory Visit: Payer: Self-pay | Admitting: *Deleted

## 2012-12-07 DIAGNOSIS — Z7189 Other specified counseling: Secondary | ICD-10-CM

## 2012-12-07 MED ORDER — PRENATAL MULTIVITAMIN CH: 1.0000 | ORAL_TABLET | Freq: Every day | ORAL | Status: DC

## 2012-12-07 NOTE — Telephone Encounter (Signed)
RF authorization for PNV since pt is still breastfeeding.

## 2012-12-08 ENCOUNTER — Encounter: Payer: Self-pay | Admitting: Physician Assistant

## 2012-12-08 ENCOUNTER — Telehealth: Payer: Self-pay | Admitting: Physician Assistant

## 2012-12-08 ENCOUNTER — Ambulatory Visit (INDEPENDENT_AMBULATORY_CARE_PROVIDER_SITE_OTHER): Payer: 59 | Admitting: Physician Assistant

## 2012-12-08 VITALS — BP 118/75 | HR 81 | Wt 121.0 lb

## 2012-12-08 DIAGNOSIS — Z23 Encounter for immunization: Secondary | ICD-10-CM

## 2012-12-08 DIAGNOSIS — F411 Generalized anxiety disorder: Secondary | ICD-10-CM

## 2012-12-08 DIAGNOSIS — F410 Panic disorder [episodic paroxysmal anxiety] without agoraphobia: Secondary | ICD-10-CM

## 2012-12-08 MED ORDER — CITALOPRAM HYDROBROMIDE 10 MG PO TABS: ORAL_TABLET | ORAL | Status: DC

## 2012-12-08 MED ORDER — CLONAZEPAM 1 MG PO TABS: 1.0000 mg | ORAL_TABLET | Freq: Two times a day (BID) | ORAL | Status: DC | PRN

## 2012-12-08 NOTE — Telephone Encounter (Signed)
Flu shot was not logged for pt. I can't close until logged.

## 2012-12-08 NOTE — Progress Notes (Signed)
  Subjective:    Patient ID: Annette Cook, female    DOB: [birth year], 27 y.o.   MRN: [id number]  HPI Patient is a 27 year old female who presents to the clinic to followup on generalized anxiety and panic attacks. 6 weeks ago she was started on Celexa daily as well as Klonopin as needed. She has been taking the Celexa faithfully. She does report some dry mouth, difficulty to climax, and more regular headaches. Headaches respond well to Tylenol or NSAIDs. She has been on average taking Klonopin 2 tabs 3 times a week. She reports that the 0.5 mg tablets do not work. She does feel less stressed at work. There has also been some help added to her work environment. She denies any exercise. Patient is unsure if Celexa is actually benefiting her.   Review of Systems     Objective:   Physical Exam  Constitutional: She is oriented to person, place, and time. She appears well-developed and well-nourished.  HENT:  Head: Normocephalic and atraumatic.  Cardiovascular: Normal rate, regular rhythm and normal heart sounds.   Pulmonary/Chest: Effort normal and breath sounds normal.  Neurological: She is alert and oriented to person, place, and time.  Skin: Skin is warm and dry.  Psychiatric: She has a normal mood and affect. Her behavior is normal.          Assessment & Plan:  Generalized anxiety disorder/panic attack-GAD-7 was 5. I don't see that a GAD was done at last visit but this is registering in mild anxiety range. I do think this is an improvement from last visit. I think patient is less aware of maybe how good she actually is feeling. I encouraged patient to try the Celexa at 20 mg and see if there is any extra benefit. We discussed side effects and how he potentially may stay the same or get better or worsen. If they worsen we can consider changing the medication at that time. Right now side effects are not so bad that she would like to change medication. I did refill Klonopin and change to 1 mg  tabs to take as needed for anxiety. I did encourage patient to use this only as needed. Followup in 3 months.  Patient was given a flu shot today with no complications.

## 2012-12-09 NOTE — Telephone Encounter (Signed)
Sorry its in now.Annette Cook

## 2012-12-16 ENCOUNTER — Telehealth: Payer: Self-pay | Admitting: Physician Assistant

## 2012-12-16 MED ORDER — PROMETHAZINE HCL 12.5 MG PO TABS: 12.5000 mg | ORAL_TABLET | Freq: Four times a day (QID) | ORAL | Status: DC | PRN

## 2012-12-16 NOTE — Telephone Encounter (Signed)
Pt called and reported nausea at same time of day after increasing dose of celexa. She went back down to previous dose and nausea did not go away. Negative pregnancy test. She is breastfeeding. Discussed it could be hormonal changes with BF. Lets taper off celexa and see if improves. Sent phenergan to pharmacy.   Discussed possible milk transmission. Told to take after pumping or feeding. Watch for any sedation in baby. Only take as needed.

## 2013-01-05 ENCOUNTER — Encounter: Payer: Self-pay | Admitting: Physician Assistant

## 2013-01-05 ENCOUNTER — Ambulatory Visit (INDEPENDENT_AMBULATORY_CARE_PROVIDER_SITE_OTHER): Payer: 59 | Admitting: Physician Assistant

## 2013-01-05 VITALS — BP 108/72 | HR 88 | Wt 125.0 lb

## 2013-01-05 DIAGNOSIS — F411 Generalized anxiety disorder: Secondary | ICD-10-CM

## 2013-01-05 DIAGNOSIS — Z13220 Encounter for screening for lipoid disorders: Secondary | ICD-10-CM

## 2013-01-05 DIAGNOSIS — F909 Attention-deficit hyperactivity disorder, unspecified type: Secondary | ICD-10-CM

## 2013-01-05 DIAGNOSIS — G4700 Insomnia, unspecified: Secondary | ICD-10-CM

## 2013-01-05 DIAGNOSIS — Z131 Encounter for screening for diabetes mellitus: Secondary | ICD-10-CM

## 2013-01-05 DIAGNOSIS — F410 Panic disorder [episodic paroxysmal anxiety] without agoraphobia: Secondary | ICD-10-CM

## 2013-01-05 MED ORDER — CLONAZEPAM 1 MG PO TABS: 1.0000 mg | ORAL_TABLET | Freq: Two times a day (BID) | ORAL | Status: DC | PRN

## 2013-01-05 MED ORDER — ONDANSETRON HCL 4 MG PO TABS: 4.0000 mg | ORAL_TABLET | Freq: Three times a day (TID) | ORAL | Status: DC | PRN

## 2013-01-05 NOTE — Patient Instructions (Addendum)
straterra to consider for ADHD.   Beta glucan 1 capsule daily.

## 2013-01-05 NOTE — Progress Notes (Signed)
  Subjective:    Patient ID: Annette Cook, female    DOB: 08-Jun-1985, 27 y.o.   MRN: [id number]  HPI Patient is a 27 year old female who presents to the clinic to followup on GAD, panic attacks, insomnia, and ADHD.  GAD/panic attacks-patient has been taking Celexa. She slowly tapered off. She is having a lot of dry mouth, sexual side effects and nausea. She does not notice any difference being on or off medication. She continues to use Klonopin as needed. She does admit to using it at least once a day but at different times.  Insomnia-patient has been controlled on Ambien for over a year. One night last week she took Ambien at 8:00 it was not able to go to sleep until after 11. She's concerned that is not working as well. She's tried the extended release Ambien 6.25 and she woke up many times during the night. She would like to know if there's anything she can use for sleep.  ADHD-patient has not been able to take stimulants while breast-feeding. Patient intends on breast-feeding until child is over 29 years old. Patient thinks that not treating her ADHD symptoms are causing her to have most of her symptoms of feeling overwhelmed and stressed out.    Review of Systems     Objective:   Physical Exam  Constitutional: She is oriented to person, place, and time. She appears well-developed and well-nourished.  HENT:  Head: Normocephalic and atraumatic.  Eyes: Conjunctivae are normal. Right eye exhibits no discharge. Left eye exhibits no discharge.  Neck: Normal range of motion. Neck supple.  Cardiovascular: Normal rate, regular rhythm and normal heart sounds.   Pulmonary/Chest: Effort normal and breath sounds normal.  Lymphadenopathy:    She has no cervical adenopathy.  Neurological: She is alert and oriented to person, place, and time.  Skin: Skin is warm and dry.  Psychiatric: She has a normal mood and affect. Her behavior is normal.          Assessment & Plan:  ADHD-I mentioned  starting Strattera a non-stimulant to see if help with symptoms. Patient declined today. Patient will consider and contact us. Patient aware she can have the first month worth of samples if she calls and wants to start. Advised patient to ask lactation specialist to make sure breast-feeding improved.  GAD/panic attack- discontinued Celexa from medication list. Refilled Klonopin today. Discussed with patient only to use as needed. I do think if ADHD symptoms are treated she might feel less overwhelmed and stressed out. When patient gets overwhelmed she does also get nauseated. Send Zofran to use as needed for nausea. Patient has historically use Phenergan but makes her too sleepy. Would like to check TSH and CBC.   Insomnia- Discussed I don't think changing ambien after one night of not sleeping is the best thing to do. I did suggest her trying melatonin a combination with Ambien. Patient aware she can use up to 10 mg of melatonin. Pt encouraged to work on bedtime routine as well as relaxation techniques.   Screening labs ordered today.   Spent 30 minutes with patient in greater than 50% of encounter spent counseling patient regarding ADHD, GAD, insomnia treatment.

## 2013-01-06 LAB — COMPLETE METABOLIC PANEL WITHOUT GFR
ALT: 13 U/L (ref 0–35)
AST: 18 U/L (ref 0–37)
Albumin: 4.4 g/dL (ref 3.5–5.2)
Alkaline Phosphatase: 56 U/L (ref 39–117)
BUN: 12 mg/dL (ref 6–23)
CO2: 31 meq/L (ref 19–32)
Calcium: 9.1 mg/dL (ref 8.4–10.5)
Chloride: 103 meq/L (ref 96–112)
Creat: 0.68 mg/dL (ref 0.50–1.10)
GFR, Est African American: >89 mL/min
GFR, Est Non African American: >89 mL/min
Glucose, Bld: 87 mg/dL (ref 70–99)
Potassium: 3.9 meq/L (ref 3.5–5.3)
Sodium: 139 meq/L (ref 135–145)
Total Bilirubin: 0.5 mg/dL (ref 0.3–1.2)
Total Protein: 6.6 g/dL (ref 6.0–8.3)

## 2013-01-06 LAB — LIPID PANEL
Cholesterol: 241 mg/dL — ABNORMAL HIGH (ref 0–200)
HDL: 97 mg/dL (ref >39)
LDL Cholesterol: 132 mg/dL — ABNORMAL HIGH (ref 0–99)
Total CHOL/HDL Ratio: 2.5 ratio
Triglycerides: 60 mg/dL (ref <150)
VLDL: 12 mg/dL (ref 0–40)

## 2013-01-06 LAB — CBC
MCH: 32.1 pg (ref 26.0–34.0)
MCV: 92.6 fL (ref 78.0–100.0)
Platelets: 219 10*3/uL (ref 150–400)
RBC: 4.58 MIL/uL (ref 3.87–5.11)
WBC: 7.6 10*3/uL (ref 4.0–10.5)

## 2013-01-06 LAB — T4, FREE: Free T4: 0.92 ng/dL (ref 0.80–1.80)

## 2013-01-26 ENCOUNTER — Telehealth: Payer: Self-pay | Admitting: *Deleted

## 2013-01-26 ENCOUNTER — Other Ambulatory Visit: Payer: Self-pay | Admitting: Physician Assistant

## 2013-01-26 ENCOUNTER — Other Ambulatory Visit (INDEPENDENT_AMBULATORY_CARE_PROVIDER_SITE_OTHER): Payer: 59 | Admitting: *Deleted

## 2013-01-26 DIAGNOSIS — J029 Acute pharyngitis, unspecified: Secondary | ICD-10-CM

## 2013-01-26 MED ORDER — AMOXICILLIN 875 MG PO TABS: 875.0000 mg | ORAL_TABLET | Freq: Two times a day (BID) | ORAL | Status: DC

## 2013-01-26 NOTE — Telephone Encounter (Signed)
Pt notified of abx. 

## 2013-01-26 NOTE — Telephone Encounter (Signed)
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  She's asking if you can send her in an abx to the [facility].  I asked her if there was anyway that the office there could put the results in her chart so there is documentation.  She is going to find out & call me back.  She is ok with having to come here so we can swab her again if need be.

## 2013-01-26 NOTE — Telephone Encounter (Signed)
She can just fax [place] results if needed and we can scan. Will send abx. She doesn't need to work today. Does she need note?

## 2013-02-09 ENCOUNTER — Other Ambulatory Visit (HOSPITAL_COMMUNITY)
Admission: RE | Admit: 2013-02-09 | Discharge: 2013-02-09 | Disposition: A | Discharge status: Home / Self Care | Payer: 59 | Source: Ambulatory Visit | Attending: Family Medicine | Admitting: Family Medicine

## 2013-02-09 ENCOUNTER — Encounter: Payer: Self-pay | Admitting: Physician Assistant

## 2013-02-09 ENCOUNTER — Ambulatory Visit (INDEPENDENT_AMBULATORY_CARE_PROVIDER_SITE_OTHER): Payer: 59 | Admitting: Physician Assistant

## 2013-02-09 VITALS — BP 106/55 | HR 91 | Wt 126.0 lb

## 2013-02-09 DIAGNOSIS — Z01419 Encounter for gynecological examination (general) (routine) without abnormal findings: Secondary | ICD-10-CM | POA: Insufficient documentation

## 2013-02-09 DIAGNOSIS — N912 Amenorrhea, unspecified: Secondary | ICD-10-CM

## 2013-02-09 DIAGNOSIS — Z0000 Encounter for general adult medical examination without abnormal findings: Secondary | ICD-10-CM

## 2013-02-09 DIAGNOSIS — R0982 Postnasal drip: Secondary | ICD-10-CM

## 2013-02-09 LAB — POCT URINALYSIS DIPSTICK
Glucose, UA: NEGATIVE
Ketones, UA: NEGATIVE
Leukocytes, UA: NEGATIVE
Nitrite, UA: NEGATIVE
Protein, UA: NEGATIVE
Spec Grav, UA: 1.01
Urobilinogen, UA: 0.2

## 2013-02-09 LAB — POCT URINE PREGNANCY: Preg Test, Ur: NEGATIVE

## 2013-02-09 MED ORDER — BECLOMETHASONE DIPROPIONATE 80 MCG/ACT NA AERS: 2.0000 | INHALATION_SPRAY | Freq: Every day | NASAL | Status: DC

## 2013-02-09 MED ORDER — HYDROCODONE-HOMATROPINE 5-1.5 MG/5ML PO SYRP: 5.0000 mL | ORAL_SOLUTION | Freq: Every evening | ORAL | Status: DC | PRN

## 2013-02-09 NOTE — Patient Instructions (Addendum)
Keeping You Healthy  Sore throat- honey/qnasal/beta glucan/regular anti-histamine.   Get These Tests 1. Blood Pressure- Have your blood pressure checked once a year by your health care provider.  Normal blood pressure is 120/80. 2. Weight- Have your body mass index (BMI) calculated to screen for obesity.  BMI is measure of body fat based on height and weight.  You can also calculate your own BMI at https://www.west-esparza.com/. 3. Cholesterol- Have your cholesterol checked every 5 years starting at age 54 then yearly starting at age 36. 4. Chlamydia, HIV, and other sexually transmitted diseases- Get screened every year until age 65, then within three months of each new sexual provider. 5. Pap Smear- Every 1-3 years; discuss with your health care provider. 6. Mammogram- Every year starting at age 72  Take these medicines  Calcium with Vitamin D-Your body needs 1200 mg of Calcium each day and [phone] IU of Vitamin D daily.  Your body can only absorb 500 mg of Calcium at a time so Calcium must be taken in 2 or 3 divided doses throughout the day.  Multivitamin with folic acid- Once daily if it is possible for you to become pregnant.  Get these Immunizations  Gardasil-Series of three doses; prevents HPV related illness such as genital warts and cervical cancer.  Menactra-Single dose; prevents meningitis.  Tetanus shot- Every 10 years.  Flu shot-Every year.  Take these steps 1. Do not smoke-Your healthcare provider can help you quit.  For tips on how to quit go to www.smokefree.gov or call 1-800 QUITNOW. 2. Be physically active- Exercise 5 days a week for at least 30 minutes.  If you are not already physically active, start slow and gradually work up to 30 minutes of moderate physical activity.  Examples of moderate activity include walking briskly, dancing, swimming, bicycling, etc. 3. Breast Cancer- A self breast exam every month is important for early detection of breast cancer.  For more  information and instruction on self breast exams, ask your healthcare provider or [email or web address].[email or web address]. 4. Eat a healthy diet- Eat a variety of healthy foods such as fruits, vegetables, whole grains, low fat milk, low fat cheeses, yogurt, lean meats, poultry and fish, beans, nuts, tofu, etc.  For more information go to www. Thenutritionsource.org 5. Drink alcohol in moderation- Limit alcohol intake to one drink or less per day. Never drink and drive. 6. Depression- Your emotional health is as important as your physical health.  If you're feeling down or losing interest in things you normally enjoy please talk to your healthcare provider about being screened for depression. 7. Dental visit- Brush and floss your teeth twice daily; visit your dentist twice a year. 8. Eye doctor- Get an eye exam at least every 2 years. 9. Helmet use- Always wear a helmet when riding a bicycle, motorcycle, rollerblading or skateboarding. 10. Safe sex- If you may be exposed to sexually transmitted infections, use a condom. 11. Seat belts- Seat belts can save your live; always wear one. 12. Smoke/Carbon Monoxide detectors- These detectors need to be installed on the appropriate level of your home. Replace batteries at least once a year. 13. Skin cancer- When out in the sun please cover up and use sunscreen 15 SPF or higher. 14. Violence- If anyone is threatening or hurting you, please tell your healthcare provider.

## 2013-02-11 ENCOUNTER — Telehealth: Payer: Self-pay | Admitting: Physician Assistant

## 2013-02-11 NOTE — Telephone Encounter (Signed)
Call pt: I was thinking about her and thought we could try singulair with zyrtec for allergies/cough/PND. Has she ever tried? Might help to target 2 different pathways.

## 2013-02-11 NOTE — Progress Notes (Signed)
Subjective:     Annette Cook is a 27 y.o. female and is here for a comprehensive physical exam. The patient reports problems - she has not had a peroid since having her baby 9 months ago and not using any birth control and wants to make sure not pregnant. she does have ongoing PND that causes cough that keeps her up at night. .  History   Social History  . Marital Status: Married    Spouse Name: N/A    Number of Children: N/A  . Years of Education: N/A   Occupational History  . front desk coordinator    Social History Main Topics  . Smoking status: Former Smoker -- 1.00 packs/day for 7 years    Types: Cigarettes    Quit date: 10/22/2004  . Smokeless tobacco: Not on file  . Alcohol Use: Yes     Comment: socially  . Drug Use: No  . Sexual Activity: Yes    Partners: Male   Other Topics Concern  . Not on file   Social History Narrative  . No narrative on file   Health Maintenance  Topic Date Due  . Tetanus/tdap  07/05/2002  . Influenza Vaccine  11/12/2013  . Pap Smear  02/10/2016    The following portions of the patient's history were reviewed and updated as appropriate: allergies, current medications, past family history, past medical history, past social history, past surgical history and problem list.  Review of Systems A comprehensive review of systems was negative.   Objective:    BP 106/55  Pulse 91  Wt 126 lb (57.153 kg)  BMI 23.82 kg/m2  Breastfeeding? Yes General appearance: alert, cooperative and appears stated age Head: Normocephalic, without obvious abnormality, atraumatic Eyes: conjunctivae/corneas clear. PERRL, EOM's intact. Fundi benign. Ears: normal TM's and external ear canals both ears Nose: Nares normal. Septum midline. Mucosa normal. No drainage or sinus tenderness. Throat: lips, mucosa, and tongue normal; teeth and gums normal Neck: no adenopathy, no carotid bruit, no JVD, supple, symmetrical, trachea midline and thyroid not enlarged,  symmetric, no tenderness/mass/nodules Back: symmetric, no curvature. ROM normal. No CVA tenderness. Lungs: clear to auscultation bilaterally Breasts: normal appearance, no masses or tenderness Heart: regular rate and rhythm, S1, S2 normal, no murmur, click, rub or gallop Abdomen: soft, non-tender; bowel sounds normal; no masses,  no organomegaly Pelvic: cervix normal in appearance, external genitalia normal, no adnexal masses or tenderness, no cervical motion tenderness, uterus normal size, shape, and consistency and vagina normal without discharge Extremities: extremities normal, atraumatic, no cyanosis or edema Pulses: 2+ and symmetric Skin: Skin color, texture, turgor normal. No rashes or lesions Lymph nodes: Cervical, supraclavicular, and axillary nodes normal. Neurologic: Grossly normal    Assessment:    Healthy female exam.      Plan:    CPE/pap- Pap done today. Tdap we do not have record of. Pt is going to find out last one and send to [place]. Discussed calcium and vitamin-d 1200mg  and 800mg . Gave HO on health maintence to consider. Discussed exercising regularly. Flu shot up to date. Fasting labs up to date and reviewed. Screening for depression negative.PHq-9 was 1. Anxiety is still ongoing with her work situation but at home she is fine.   Amenorrhea- discussed that while BF might not have period. UPT negative.   Cough/PND- it does sound like cough is coming from PND. Pt already on protonix. Using flonase, zyrtec regularly. Discussed beta glucan. Discussed adding decongestant to anti-histamine. It may decrease milk production  but she can try if she would like. Switch nasal spray to qnasl daily. Home remedies such as honey were discussed. If allergies not improving with that could consider allergist.  See After Visit Summary for Counseling Recommendations

## 2013-02-11 NOTE — Telephone Encounter (Signed)
LMOM for pt to return my call.

## 2013-02-25 ENCOUNTER — Ambulatory Visit (INDEPENDENT_AMBULATORY_CARE_PROVIDER_SITE_OTHER): Payer: 59 | Admitting: Family Medicine

## 2013-02-25 ENCOUNTER — Encounter: Payer: Self-pay | Admitting: Family Medicine

## 2013-02-25 VITALS — BP 101/69 | HR 81 | Resp 16 | Wt 125.0 lb

## 2013-02-25 DIAGNOSIS — R070 Pain in throat: Secondary | ICD-10-CM

## 2013-02-25 LAB — POCT RAPID STREP A (OFFICE): Rapid Strep A Screen: NEGATIVE

## 2013-02-25 NOTE — Progress Notes (Signed)
  Subjective:    Patient ID: Annette Cook, female    DOB: [birth year], 27 y.o.   MRN: [id number]  Annette Cook is here today complaining of a sore throat.    Sore Throat  This is a new problem. The current episode started today. The problem has been gradually worsening. There has been no fever. The pain is at a severity of 3/10. The pain is mild. Associated symptoms include ear pain, headaches, neck pain and swollen glands. She has tried NSAIDs for the symptoms. The treatment provided no relief.     Review of Systems  Constitutional: Negative.  Negative for fever and chills.       Generalized body aches  HENT: Positive for ear pain and sore throat.   Eyes: Negative.   Respiratory: Negative.   Cardiovascular: Negative.   Gastrointestinal: Negative.   Endocrine: Negative.   Genitourinary: Negative.   Musculoskeletal: Positive for neck pain.  Skin: Negative.   Allergic/Immunologic: Negative.   Neurological: Positive for headaches.  Hematological: Negative.   Psychiatric/Behavioral: Negative.      Past Medical History  Diagnosis Date  . ADHD (attention deficit hyperactivity disorder)   . Anxiety   . Insomnia      Family History  Problem Relation Age of Onset  . Hypertension Mother      History   Social History Narrative   Marital Status: Married Programmer, systems)    Children:  Son Marine scientist) Daughter (Remi)    Pets: None    Living Situation: Lives with husband, step-daughter & children.    Occupation: Therapist, [employer])   Education: High School Graduate    Tobacco Use/Exposure:  Quit smoking in 2004 after having smoked 1/2 ppd for 5 years.      Alcohol Use:  None   Drug Use:  None   Diet:  Regular   Exercise:  None   Hobbies: Fishing ([place])              Objective:   Physical Exam  Vitals reviewed. Constitutional: She is oriented to person, place, and time. She appears well-developed and well-nourished.  Cardiovascular: Normal rate and regular rhythm.    Pulmonary/Chest: Effort normal and breath sounds normal.  Neurological: She is alert and oriented to person, place, and time.  Skin: Skin is warm and dry.  Psychiatric: She has a normal mood and affect.      Assessment & Plan:    Annette Cook was seen today for sore throat.  Diagnoses and associated orders for this visit:  Pain in throat - POCT rapid strep A (Negative)  - Upper Respiratory Culture (Normal Respiratory Flora)

## 2013-02-28 LAB — CULTURE, UPPER RESPIRATORY: Organism ID, Bacteria: NORMAL

## 2013-03-01 ENCOUNTER — Other Ambulatory Visit: Payer: Self-pay | Admitting: *Deleted

## 2013-03-01 MED ORDER — HYDROCODONE-HOMATROPINE 5-1.5 MG/5ML PO SYRP: 5.0000 mL | ORAL_SOLUTION | Freq: Every evening | ORAL | Status: DC | PRN

## 2013-03-09 ENCOUNTER — Encounter: Payer: Self-pay | Admitting: Physician Assistant

## 2013-03-09 ENCOUNTER — Ambulatory Visit (INDEPENDENT_AMBULATORY_CARE_PROVIDER_SITE_OTHER): Payer: 59 | Admitting: Physician Assistant

## 2013-03-09 VITALS — BP 108/61 | HR 74 | Temp 97.8°F | Wt 125.0 lb

## 2013-03-09 DIAGNOSIS — J3489 Other specified disorders of nose and nasal sinuses: Secondary | ICD-10-CM

## 2013-03-09 DIAGNOSIS — R05 Cough: Secondary | ICD-10-CM

## 2013-03-09 DIAGNOSIS — J029 Acute pharyngitis, unspecified: Secondary | ICD-10-CM

## 2013-03-09 DIAGNOSIS — H9203 Otalgia, bilateral: Secondary | ICD-10-CM

## 2013-03-09 DIAGNOSIS — R059 Cough, unspecified: Secondary | ICD-10-CM

## 2013-03-09 DIAGNOSIS — H9209 Otalgia, unspecified ear: Secondary | ICD-10-CM

## 2013-03-09 MED ORDER — FLUCONAZOLE 150 MG PO TABS: 150.0000 mg | ORAL_TABLET | Freq: Every day | ORAL | Status: DC

## 2013-03-09 MED ORDER — HYDROCODONE-HOMATROPINE 5-1.5 MG/5ML PO SYRP: 5.0000 mL | ORAL_SOLUTION | Freq: Every evening | ORAL | Status: DC | PRN

## 2013-03-09 MED ORDER — AZITHROMYCIN 250 MG PO TABS: ORAL_TABLET | ORAL | Status: DC

## 2013-03-09 MED ORDER — AZELASTINE HCL 0.1 % NA SOLN: 2.0000 | Freq: Two times a day (BID) | NASAL | Status: DC

## 2013-03-09 MED ORDER — METHYLPREDNISOLONE (PAK) 4 MG PO TABS: ORAL_TABLET | ORAL | Status: DC

## 2013-03-09 NOTE — Progress Notes (Signed)
   Subjective:    Patient ID: Annette Cook, female    DOB: 07-28-1985, 27 y.o.   MRN: [id number]  HPI Patient is a 27 yo female who presents to the clinic with over 2 weeks of ST, cough, nasal congestion, and sinus pressure. Pt was seen by Dr. Alberteen Sam, primary care MD, about a week ago and strep was negative and was given OTC symptomatic care for URI. Pt has tried Norel, mucinex DM, cough suppressants, zyrtec, and NSAIDs. She still has no relief. She feels more and more pressure in sinuses. She constantly feels like her throat is draining. She denies any fever, chills. She has been nauseated but no vomiting. Cough is not productive but its keeps her up  She has ear pain off and on. Her kids have been sick for past week also.   Review of Systems     Objective:   Physical Exam  Constitutional: She is oriented to person, place, and time. She appears well-developed and well-nourished.  HENT:  Head: Normocephalic and atraumatic.  Right Ear: External ear normal.  Left Ear: External ear normal.  Left TM slightly retracted.   Oropharynx erythematous. Tonsils not swollen.   No maxillary or frontal tenderness on todays exam.   Eyes: Conjunctivae are normal.  Neck: Normal range of motion. Neck supple.  Bilateral anterior cervical adenopathy. Non-tender.   Cardiovascular: Normal rate, regular rhythm and normal heart sounds.   Pulmonary/Chest: Effort normal and breath sounds normal. She has no wheezes.  Neurological: She is alert and oriented to person, place, and time.  Skin: Skin is warm and dry.  Dark circles under eyes.   Psychiatric: She has a normal mood and affect. Her behavior is normal.          Assessment & Plan:  URI/sinusitis/allergies- I did go ahead and treat with zpak, steroid pack. I switched nasal spray to astelin. Pt has history of yeast infections with abx. Diflucan was sent. Encouraged patient to stay on zyrtec daily. Did give hycodan for cough. Call if not improving.

## 2013-04-13 ENCOUNTER — Telehealth: Payer: Self-pay | Admitting: *Deleted

## 2013-04-13 NOTE — Telephone Encounter (Signed)
Pt called in and stated that she has not been able to sleep well and wanted to know if Lesly Rubenstein would recommend her taking a clonopin at bedtime to help her to sleep. She also takes Ambien and this has not been working for her. Please advise.Loralee Pacas [place]

## 2013-04-13 NOTE — Telephone Encounter (Signed)
She can try klonapin at bedtime to help sleep but not to take with ambien. She could take melatonin 10mg  with klonapin.

## 2013-04-15 NOTE — Telephone Encounter (Signed)
lvm informing pt of Jades recommendations.Audelia Hives [employer]

## 2013-04-18 DIAGNOSIS — R070 Pain in throat: Secondary | ICD-10-CM | POA: Insufficient documentation

## 2013-04-27 ENCOUNTER — Other Ambulatory Visit: Payer: Self-pay | Admitting: *Deleted

## 2013-04-27 ENCOUNTER — Telehealth: Payer: Self-pay | Admitting: *Deleted

## 2013-04-27 MED ORDER — CLONAZEPAM 1 MG PO TABS: 1.0000 mg | ORAL_TABLET | Freq: Two times a day (BID) | ORAL | Status: DC | PRN

## 2013-04-27 MED ORDER — OSELTAMIVIR PHOSPHATE 75 MG PO CAPS: 75.0000 mg | ORAL_CAPSULE | Freq: Every day | ORAL | Status: DC

## 2013-04-27 NOTE — Telephone Encounter (Signed)
Pt calls stating that her husband has the flu & she was wondering if she needed tamiflu now or if she should wait until she show sx? Also she wants to know if you will give #60 of her clonazepam since she is able to take up to 2 a day? Please advise ([facility])

## 2013-04-27 NOTE — Telephone Encounter (Signed)
rx's sent.  Pt notified.

## 2013-04-27 NOTE — Telephone Encounter (Signed)
Ok to send tamiflu preventative 75mg  once a day for 10 days. quanity 10. [place] for #60 klonapin for BID usage.

## 2013-04-29 ENCOUNTER — Other Ambulatory Visit: Payer: Self-pay | Admitting: Physician Assistant

## 2013-04-29 NOTE — Telephone Encounter (Signed)
Are you ok filling this?  It is Jades pt

## 2013-04-29 NOTE — Telephone Encounter (Signed)
Rx has been sent to [facility]

## 2013-05-04 ENCOUNTER — Other Ambulatory Visit: Payer: Self-pay | Admitting: *Deleted

## 2013-05-04 MED ORDER — ZOLPIDEM TARTRATE 10 MG PO TABS: 10.0000 mg | ORAL_TABLET | Freq: Every day | ORAL | Status: DC

## 2013-05-30 ENCOUNTER — Telehealth: Payer: Self-pay | Admitting: *Deleted

## 2013-05-30 ENCOUNTER — Other Ambulatory Visit: Payer: Self-pay | Admitting: Physician Assistant

## 2013-05-30 MED ORDER — AMOXICILLIN 500 MG PO CAPS: 500.0000 mg | ORAL_CAPSULE | Freq: Two times a day (BID) | ORAL | Status: DC

## 2013-05-30 NOTE — Telephone Encounter (Signed)
Any flu exposures? Tried anything?

## 2013-05-30 NOTE — Telephone Encounter (Signed)
Was already on Tamiflu prophalactically due to her husband having it.

## 2013-05-30 NOTE — Telephone Encounter (Signed)
Pt calls & states that she is running a fever, coughing, scratchy throat, "scratchy" ears, painful teeth.  Sx started sat.  Neg rapid strep at work.

## 2013-06-03 ENCOUNTER — Telehealth: Payer: Self-pay | Admitting: *Deleted

## 2013-06-03 MED ORDER — METHYLPREDNISOLONE 4 MG PO KIT: PACK | ORAL | Status: DC

## 2013-06-03 NOTE — Telephone Encounter (Signed)
Yes can we make appt to come.

## 2013-06-03 NOTE — Telephone Encounter (Signed)
Ok for medrol dose pak. If not improving needs to be seen in office.

## 2013-06-03 NOTE — Telephone Encounter (Signed)
She can't come in now. They still have a pt there & she has no coverage to leave.

## 2013-06-03 NOTE — Telephone Encounter (Signed)
Pt left message stating that she's been on the abx since Monday but is getting worse.  She wants to know if she would benefit from the prednisone dose pack.  If you need to see her, she said she could come on her lunch (1-2pm).

## 2013-06-03 NOTE — Telephone Encounter (Signed)
Pt notified of rx. 

## 2013-06-08 ENCOUNTER — Other Ambulatory Visit: Payer: Self-pay | Admitting: Family Medicine

## 2013-06-08 ENCOUNTER — Other Ambulatory Visit: Payer: Self-pay | Admitting: *Deleted

## 2013-06-08 MED ORDER — PANTOPRAZOLE SODIUM 20 MG PO TBEC
20.0000 mg | DELAYED_RELEASE_TABLET | Freq: Every day | ORAL | Status: DC
Start: 2013-06-08 — End: 2014-03-17

## 2013-06-08 NOTE — Telephone Encounter (Signed)
Amber will you send protonix 40mg  90 day supply one refill.

## 2013-07-04 ENCOUNTER — Ambulatory Visit: Payer: 59 | Admitting: Physician Assistant

## 2013-07-06 ENCOUNTER — Ambulatory Visit: Payer: 59 | Admitting: Physician Assistant

## 2013-07-06 ENCOUNTER — Ambulatory Visit (INDEPENDENT_AMBULATORY_CARE_PROVIDER_SITE_OTHER): Payer: 59 | Admitting: Physician Assistant

## 2013-07-06 ENCOUNTER — Encounter: Payer: Self-pay | Admitting: Physician Assistant

## 2013-07-06 VITALS — BP 125/71 | HR 84 | Ht 61.0 in | Wt 126.0 lb

## 2013-07-06 DIAGNOSIS — N898 Other specified noninflammatory disorders of vagina: Secondary | ICD-10-CM

## 2013-07-06 DIAGNOSIS — R110 Nausea: Secondary | ICD-10-CM

## 2013-07-06 DIAGNOSIS — R109 Unspecified abdominal pain: Secondary | ICD-10-CM

## 2013-07-06 DIAGNOSIS — F430 Acute stress reaction: Secondary | ICD-10-CM

## 2013-07-06 DIAGNOSIS — R197 Diarrhea, unspecified: Secondary | ICD-10-CM

## 2013-07-06 DIAGNOSIS — N949 Unspecified condition associated with female genital organs and menstrual cycle: Secondary | ICD-10-CM

## 2013-07-06 LAB — POCT URINALYSIS DIPSTICK
BILIRUBIN UA: NEGATIVE
GLUCOSE UA: NEGATIVE
KETONES UA: NEGATIVE
Leukocytes, UA: NEGATIVE
Nitrite, UA: NEGATIVE
Protein, UA: NEGATIVE
RBC UA: NEGATIVE
SPEC GRAV UA: 1.01
Urobilinogen, UA: 0.2
pH, UA: 7

## 2013-07-06 LAB — WET PREP FOR TRICH, YEAST, CLUE
Clue Cells Wet Prep HPF POC: NONE SEEN
TRICH WET PREP: NONE SEEN
WBC WET PREP: NONE SEEN
YEAST WET PREP: NONE SEEN

## 2013-07-06 LAB — POCT URINE PREGNANCY: Preg Test, Ur: NEGATIVE

## 2013-07-06 NOTE — Progress Notes (Signed)
   Subjective:    Patient ID: Annette Cook, female    DOB: 04-25-1985, 28 y.o.   MRN: [id number]  HPI Patient comes into the office with diarrhea, nausea, abdominal pain and feels very overwhelmed.  Patient is going through a lot at her workplace. 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 They have not to the nicest messages to her. She is very upset about this. She does not want to have to quit her job and lose vacation time. She finds herself having to use Klonopin more often because she felt so overwhelmed. On Monday her stools became loose and she felt a lot of stomach cramping and not. She's continued to have decreased appetite and nausea. The last 3 days she has had diarrhea 5-6 times. She denies any blood in stools. She denies any recent antibiotic usage or recent travel. No one else in her family is sick.  She is also having some vaginal discharge and odor. This started a month or so ago. She has not had a period for over a year and does not use tampons.  Review of Systems     Objective:   Physical Exam  Constitutional: She is oriented to person, place, and time. She appears well-developed and well-nourished.  HENT:  Head: Normocephalic and atraumatic.  Cardiovascular: Normal rate, regular rhythm and normal heart sounds.   Pulmonary/Chest: Effort normal and breath sounds normal. She has no wheezes.  Abdominal: Soft. Bowel sounds are normal. She exhibits no distension and no mass. There is no tenderness. There is no rebound and no guarding.  Neurological: She is alert and oriented to person, place, and time.  Skin: Skin is dry.  Psychiatric:  Patient is visibly upset and trembles only talk about her work situation.          Assessment & Plan:  Abdominal pain/nausea/diarrhea- we'll get labs to evaluate abdominal pain and diarrhea. We'll go ahead and order stool culture and and parasites. Discussed with patient it seems like anxiety  could be playing a role in her GI symptoms. She could even have some viral illness. Consider BRAT diet. If continuing over next couple of days ok for imodium.   Vaginal discharge/odor-will get wet prep today. Discuss apple cider vinegar for odor.   Acute stress reaction- GAD-7 was 20.I feel like stress could be causing some of her abdominal pain and diarrhea. Patient has not had good response to SSRIs in the past. Patient is not wanting to start medication at this time. Did discuss with patient short-term increasing Klonopin to one half tab 3 times a day. Patient is in agreement. Hopefully once the situation is resolved patient will be able to go back down to Klonopin as needed. Discussed options of talking with counselor. She does not want to do this at this time.   Spent 30 minutes with patient greater than 50% of visit spent counseling patient guarding anxiety with work.

## 2013-07-06 NOTE — Patient Instructions (Signed)
Diet for Diarrhea, Adult  Frequent, runny stools (diarrhea) may be caused or worsened by food or drink. Diarrhea may be relieved by changing your diet. Since diarrhea can last up to 7 days, it is easy for you to lose too much fluid from the body and become dehydrated. Fluids that are lost need to be replaced. Along with a modified diet, make sure you drink enough fluids to keep your urine clear or pale yellow.  DIET INSTRUCTIONS  · Ensure adequate fluid intake (hydration): have 1 cup (8 oz) of fluid for each diarrhea episode. Avoid fluids that contain simple sugars or sports drinks, fruit juices, whole milk products, and sodas. Your urine should be clear or pale yellow if you are drinking enough fluids. Hydrate with an oral rehydration solution that you can purchase at pharmacies, retail stores, and online. You can prepare an oral rehydration solution at home by mixing the following ingredients together:  ·   tsp table salt.  · ¾ tsp baking soda.  ·  tsp salt substitute containing potassium chloride.  · 1  tablespoons sugar.  · 1 L (34 oz) of water.  · Certain foods and beverages may increase the speed at which food moves through the gastrointestinal (GI) tract. These foods and beverages should be avoided and include:  · Caffeinated and alcoholic beverages.  · High-fiber foods, such as raw fruits and vegetables, nuts, seeds, and whole grain breads and cereals.  · Foods and beverages sweetened with sugar alcohols, such as xylitol, sorbitol, and mannitol.  · Some foods may be well tolerated and may help thicken stool including:  · Starchy foods, such as rice, toast, pasta, low-sugar cereal, oatmeal, grits, baked potatoes, crackers, and bagels.    · Bananas.    · Applesauce.  · Add probiotic-rich foods to help increase healthy bacteria in the GI tract, such as yogurt and fermented milk products.  RECOMMENDED FOODS AND BEVERAGES  Starches  Choose foods with less than 2 g of fiber per serving.  · Recommended:  White,  French, and pita breads, plain rolls, buns, bagels. Plain muffins, matzo. Soda, saltine, or graham crackers. Pretzels, melba toast, zwieback. Cooked cereals made with water: cornmeal, farina, cream cereals. Dry cereals: refined corn, wheat, rice. Potatoes prepared any way without skins, refined macaroni, spaghetti, noodles, refined rice.  · Avoid:  Bread, rolls, or crackers made with whole wheat, multi-grains, rye, bran seeds, nuts, or coconut. Corn tortillas or taco shells. Cereals containing whole grains, multi-grains, bran, coconut, nuts, raisins. Cooked or dry oatmeal. Coarse wheat cereals, granola. Cereals advertised as "high-fiber." Potato skins. Whole grain pasta, wild or brown rice. Popcorn. Sweet potatoes, yams. Sweet rolls, doughnuts, waffles, pancakes, sweet breads.  Vegetables  · Recommended: Strained tomato and vegetable juices. Most well-cooked and canned vegetables without seeds. Fresh: Tender lettuce, cucumber without the skin, cabbage, spinach, bean sprouts.  · Avoid: Fresh, cooked, or canned: Artichokes, baked beans, beet greens, broccoli, Brussels sprouts, corn, kale, legumes, peas, sweet potatoes. Cooked: Green or red cabbage, spinach. Avoid large servings of any vegetables because vegetables shrink when cooked, and they contain more fiber per serving than fresh vegetables.  Fruit  · Recommended: Cooked or canned: Apricots, applesauce, cantaloupe, cherries, fruit cocktail, grapefruit, grapes, kiwi, mandarin oranges, peaches, pears, plums, watermelon. Fresh: Apples without skin, ripe banana, grapes, cantaloupe, cherries, grapefruit, peaches, oranges, plums. Keep servings limited to ½ cup or 1 piece.  · Avoid: Fresh: Apples with skin, apricots, mangoes, pears, raspberries, strawberries. Prune juice, stewed or dried prunes. Dried   fruits, raisins, dates. Large servings of all fresh fruits.  Protein  · Recommended: Ground or well-cooked tender beef, ham, veal, lamb, pork, or poultry. Eggs. Fish,  oysters, shrimp, lobster, other seafoods. Liver, organ meats.  · Avoid: Tough, fibrous meats with gristle. Peanut butter, smooth or chunky. Cheese, nuts, seeds, legumes, dried peas, beans, lentils.  Dairy  · Recommended: Yogurt, lactose-free milk, kefir, drinkable yogurt, buttermilk, soy milk, or plain hard cheese.  · Avoid: Milk, chocolate milk, beverages made with milk, such as milkshakes.  Soups  · Recommended: Bouillon, broth, or soups made from allowed foods. Any strained soup.  · Avoid: Soups made from vegetables that are not allowed, cream or milk-based soups.  Desserts and Sweets  · Recommended: Sugar-free gelatin, sugar-free frozen ice pops made without sugar alcohol.  · Avoid: Plain cakes and cookies, pie made with fruit, pudding, custard, cream pie. Gelatin, fruit, ice, sherbet, frozen ice pops. Ice cream, ice milk without nuts. Plain hard candy, honey, jelly, molasses, syrup, sugar, chocolate syrup, gumdrops, marshmallows.  Fats and Oils  · Recommended: Limit fats to less than 8 tsp per day.  · Avoid: Seeds, nuts, olives, avocados. Margarine, butter, cream, mayonnaise, salad oils, plain salad dressings. Plain gravy, crisp bacon without rind.  Beverages  · Recommended: Water, decaffeinated teas, oral rehydration solutions, sugar-free beverages not sweetened with sugar alcohols.  · Avoid: Fruit juices, caffeinated beverages (coffee, tea, soda), alcohol, sports drinks, or lemon-lime soda.  Condiments  · Recommended: Ketchup, mustard, horseradish, vinegar, cocoa powder. Spices in moderation: allspice, basil, bay leaves, celery powder or leaves, cinnamon, cumin powder, curry powder, ginger, mace, marjoram, onion or garlic powder, oregano, paprika, parsley flakes, ground pepper, rosemary, sage, savory, tarragon, thyme, turmeric.  · Avoid: Coconut, honey.  Document Released: 06/21/2003 Document Revised: 12/24/2011 Document Reviewed: 08/15/2011  ExitCare® Patient Information ©2014 [employer].

## 2013-07-07 ENCOUNTER — Other Ambulatory Visit: Payer: Self-pay | Admitting: Family Medicine

## 2013-07-07 LAB — CBC WITH DIFFERENTIAL/PLATELET
Basophils Absolute: 0 10*3/uL (ref 0.0–0.1)
Basophils Relative: 0 % (ref 0–1)
EOS ABS: 0.1 10*3/uL (ref 0.0–0.7)
Eosinophils Relative: 1 % (ref 0–5)
HCT: 41.4 % (ref 36.0–46.0)
HEMOGLOBIN: 14.2 g/dL (ref 12.0–15.0)
Lymphocytes Relative: 18 % (ref 12–46)
Lymphs Abs: 1.5 10*3/uL (ref 0.7–4.0)
MCH: 31.1 pg (ref 26.0–34.0)
MCHC: 34.3 g/dL (ref 30.0–36.0)
MCV: 90.8 fL (ref 78.0–100.0)
MONOS PCT: 9 % (ref 3–12)
Monocytes Absolute: 0.7 10*3/uL (ref 0.1–1.0)
NEUTROS ABS: 6 10*3/uL (ref 1.7–7.7)
NEUTROS PCT: 72 % (ref 43–77)
Platelets: 276 10*3/uL (ref 150–400)
RBC: 4.56 MIL/uL (ref 3.87–5.11)
RDW: 13 % (ref 11.5–15.5)
WBC: 8.3 10*3/uL (ref 4.0–10.5)

## 2013-07-07 LAB — COMPLETE METABOLIC PANEL WITH GFR
ALT: 13 U/L (ref 0–35)
AST: 16 U/L (ref 0–37)
Albumin: 4.8 g/dL (ref 3.5–5.2)
Alkaline Phosphatase: 57 U/L (ref 39–117)
BILIRUBIN TOTAL: 0.7 mg/dL (ref 0.2–1.2)
BUN: 8 mg/dL (ref 6–23)
CO2: 23 meq/L (ref 19–32)
Calcium: 9.2 mg/dL (ref 8.4–10.5)
Chloride: 100 mEq/L (ref 96–112)
Creat: 0.52 mg/dL (ref 0.50–1.10)
Glucose, Bld: 87 mg/dL (ref 70–99)
Potassium: 3.9 mEq/L (ref 3.5–5.3)
SODIUM: 137 meq/L (ref 135–145)
Total Protein: 7.2 g/dL (ref 6.0–8.3)

## 2013-07-08 DIAGNOSIS — F430 Acute stress reaction: Secondary | ICD-10-CM | POA: Insufficient documentation

## 2013-07-08 LAB — OVA AND PARASITE EXAMINATION: OP: NONE SEEN

## 2013-07-11 LAB — STOOL CULTURE

## 2013-07-25 ENCOUNTER — Encounter: Payer: Self-pay | Admitting: Physician Assistant

## 2013-08-01 ENCOUNTER — Telehealth: Payer: Self-pay | Admitting: *Deleted

## 2013-08-01 DIAGNOSIS — Z79899 Other long term (current) drug therapy: Secondary | ICD-10-CM

## 2013-08-01 MED ORDER — CONCEPT DHA 53.5-38-1 MG PO CAPS: 1.0000 | ORAL_CAPSULE | Freq: Every day | ORAL | Status: DC

## 2013-08-01 NOTE — Telephone Encounter (Signed)
Rcvd fax from pharm stating current prenatal vitamin is unavailable and would like to switch to something they have in stock. Pt currently on Natalvirt DHA and will switch to Concept DHA  - Spoke to [employer] at [facility] about script. Pt needed Concept DHA due to insurance premium as well as needing substitution due to [facility] being out of stock.

## 2013-08-03 ENCOUNTER — Other Ambulatory Visit: Payer: Self-pay | Admitting: Physician Assistant

## 2013-08-03 MED ORDER — HYDROCODONE-HOMATROPINE 5-1.5 MG/5ML PO SYRP: 5.0000 mL | ORAL_SOLUTION | Freq: Every evening | ORAL | Status: DC | PRN

## 2013-08-05 ENCOUNTER — Other Ambulatory Visit: Payer: Self-pay | Admitting: *Deleted

## 2013-08-05 MED ORDER — CLONAZEPAM 1 MG PO TABS: 1.0000 mg | ORAL_TABLET | Freq: Two times a day (BID) | ORAL | Status: DC | PRN

## 2013-08-05 MED ORDER — ZOLPIDEM TARTRATE 10 MG PO TABS: 10.0000 mg | ORAL_TABLET | Freq: Every day | ORAL | Status: DC

## 2013-11-10 ENCOUNTER — Telehealth: Payer: Self-pay | Admitting: *Deleted

## 2013-11-10 ENCOUNTER — Other Ambulatory Visit: Payer: Self-pay | Admitting: *Deleted

## 2013-11-10 MED ORDER — PROMETHAZINE HCL 25 MG PO TABS: ORAL_TABLET | ORAL | Status: DC

## 2013-11-10 NOTE — Telephone Encounter (Signed)
Phenergan 25mg  #90 sent to [facility] [place].  Pt notified.

## 2013-11-10 NOTE — Telephone Encounter (Signed)
Sorry no given her 71

## 2013-11-10 NOTE — Telephone Encounter (Signed)
Pt called & asked if you would be willing to send in a 90 day supply of phenergan in for her.  She found out she's pregnant & she said she's extremely nauseated.  She [facility] to [facility] in [place].

## 2013-11-10 NOTE — Telephone Encounter (Signed)
Sig? Is #30 enough for a 90 day?

## 2013-11-10 NOTE — Telephone Encounter (Signed)
zofran is a class safer in pregnancy is she want zofran give her that. If not and has used phenergan before go ahead and given 30 phenergan.  congrats!

## 2013-12-07 ENCOUNTER — Ambulatory Visit (INDEPENDENT_AMBULATORY_CARE_PROVIDER_SITE_OTHER): Payer: Commercial Managed Care - PPO | Admitting: Obstetrics & Gynecology

## 2013-12-07 ENCOUNTER — Other Ambulatory Visit: Payer: Self-pay | Admitting: Obstetrics & Gynecology

## 2013-12-07 ENCOUNTER — Encounter: Payer: Self-pay | Admitting: Obstetrics & Gynecology

## 2013-12-07 VITALS — BP 101/56 | HR 98 | Wt 124.0 lb

## 2013-12-07 DIAGNOSIS — Z3481 Encounter for supervision of other normal pregnancy, first trimester: Secondary | ICD-10-CM

## 2013-12-07 DIAGNOSIS — Z3480 Encounter for supervision of other normal pregnancy, unspecified trimester: Secondary | ICD-10-CM

## 2013-12-07 DIAGNOSIS — Z23 Encounter for immunization: Secondary | ICD-10-CM

## 2013-12-07 MED ORDER — VALACYCLOVIR HCL 500 MG PO TABS: 1000.0000 mg | ORAL_TABLET | Freq: Every day | ORAL | Status: DC

## 2013-12-07 NOTE — Progress Notes (Signed)
   Subjective:    Annette Cook is a [id number] [redacted]w[redacted]d being seen today for her first obstetrical visit.  Her obstetrical history is significant for none except short interval between pregnancies. Patient does intend to breast feed. She is still breastfeeding her daughter.  Pregnancy history fully reviewed.  Patient reports nausea which is improving and not often.  Filed Vitals:   12/07/13 1012  BP: 101/56  Pulse: 98  Weight: 124 lb (56.246 kg)    HISTORY: OB History  Gravida Para Term Preterm AB SAB TAB Ectopic Multiple Living  3 2 2       2     # Outcome Date GA Lbr Len/2nd Weight Sex Delivery Anes PTL Lv  3 CUR           2 TRM 06/20/12 [redacted]w[redacted]d 04:11 / 02:30 7 lb 7.2 oz (3.38 kg) F SVD EPI  Y     Comments: na  1 TRM 08/21/07 [redacted]w[redacted]d  7 lb 12 oz (3.515 kg) M SVD EPI N Y     Past Medical History  Diagnosis Date  . ADHD (attention deficit hyperactivity disorder)   . Anxiety   . Insomnia    Past Surgical History  Procedure Laterality Date  . Appendectomy    . Wisdom tooth extraction    . Tonsillectomy and adenoidectomy     Family History  Problem Relation Age of Onset  . Hypertension Mother   . Stroke Mother      Exam    Uterus:     Pelvic Exam:    Perineum: No Hemorrhoids   Vulva: normal   Vagina:  normal mucosa   pH:    Cervix: anteverted   Adnexa: normal adnexa   Bony Pelvis: android  System: Breast:  normal appearance, no masses or tenderness   Skin: normal coloration and turgor, no rashes    Neurologic: oriented   Extremities: normal strength, tone, and muscle mass   HEENT PERRLA   Mouth/Teeth mucous membranes moist, pharynx normal without lesions   Neck supple   Cardiovascular: regular rate and rhythm   Respiratory:  appears well, vitals normal, no respiratory distress, acyanotic, normal RR, ear and throat exam is normal, neck free of mass or lymphadenopathy, chest clear, no wheezing, crepitations, rhonchi, normal symmetric air entry   Abdomen: soft,  non-tender; bowel sounds normal; no masses,  no organomegaly, uterus at U-2   Urinary: urethral meatus normal      Assessment:    Pregnancy: [id number] Patient Active Problem List   Diagnosis Date Noted  . Encounter for supervision of other normal pregnancy in first trimester 12/07/2013  . Acute stress reaction 07/08/2013  . Pain in throat 04/18/2013  . Generalized anxiety disorder 10/22/2012  . Insomnia 10/22/2012  . Panic attack 10/22/2012  . ADHD (attention deficit hyperactivity disorder) 10/22/2012  . Herpes 12/02/2011        Plan:     Initial labs drawn. Prenatal vitamins. Problem list reviewed and updated. Genetic Screening discussed Quad Screen: requested. It will be done at her next visit.  Ultrasound discussed; fetal survey: ordered for 2 weeks from now  Follow up in 3 weeks.  Hadli Vandemark C. 12/07/2013

## 2013-12-08 LAB — GC/CHLAMYDIA PROBE AMP
CT PROBE, AMP APTIMA: NEGATIVE
GC PROBE AMP APTIMA: NEGATIVE

## 2013-12-08 LAB — OBSTETRIC PANEL
Antibody Screen: NEGATIVE
BASOS ABS: 0 10*3/uL (ref 0.0–0.1)
BASOS PCT: 0 % (ref 0–1)
Eosinophils Absolute: 0.1 10*3/uL (ref 0.0–0.7)
Eosinophils Relative: 1 % (ref 0–5)
HCT: 36.7 % (ref 36.0–46.0)
Hemoglobin: 12.6 g/dL (ref 12.0–15.0)
Hepatitis B Surface Ag: NEGATIVE
Lymphocytes Relative: 22 % (ref 12–46)
Lymphs Abs: 1.8 10*3/uL (ref 0.7–4.0)
MCH: 31.3 pg (ref 26.0–34.0)
MCHC: 34.3 g/dL (ref 30.0–36.0)
MCV: 91.1 fL (ref 78.0–100.0)
Monocytes Absolute: 0.4 10*3/uL (ref 0.1–1.0)
Monocytes Relative: 5 % (ref 3–12)
NEUTROS ABS: 5.9 10*3/uL (ref 1.7–7.7)
NEUTROS PCT: 72 % (ref 43–77)
PLATELETS: 222 10*3/uL (ref 150–400)
RBC: 4.03 MIL/uL (ref 3.87–5.11)
RDW: 13.3 % (ref 11.5–15.5)
RUBELLA: 1.92 {index} — AB (ref <0.90)
Rh Type: NEGATIVE
WBC: 8.2 10*3/uL (ref 4.0–10.5)

## 2013-12-08 LAB — CULTURE, URINE COMPREHENSIVE
Colony Count: NO GROWTH
ORGANISM ID, BACTERIA: NO GROWTH

## 2013-12-08 LAB — HIV ANTIBODY (ROUTINE TESTING W REFLEX): HIV 1&2 Ab, 4th Generation: NONREACTIVE

## 2013-12-22 ENCOUNTER — Ambulatory Visit (INDEPENDENT_AMBULATORY_CARE_PROVIDER_SITE_OTHER): Payer: Commercial Managed Care - PPO | Admitting: Obstetrics & Gynecology

## 2013-12-22 ENCOUNTER — Encounter: Payer: Self-pay | Admitting: Obstetrics & Gynecology

## 2013-12-22 VITALS — BP 101/55 | HR 87 | Wt 126.0 lb

## 2013-12-22 DIAGNOSIS — Z3480 Encounter for supervision of other normal pregnancy, unspecified trimester: Secondary | ICD-10-CM

## 2013-12-22 NOTE — Progress Notes (Signed)
Quad screen today. Anatomy US close to 20 weeks.  Will use this to date.

## 2013-12-26 ENCOUNTER — Encounter: Payer: Self-pay | Admitting: Obstetrics & Gynecology

## 2013-12-26 LAB — AFP, QUAD SCREEN
AFP: 36.9 ng/mL
Age Alone: 1:668 {titer}
Curr Gest Age: 18.1 wks.days
Down Syndrome Scr Risk Est: 1:22600 {titer}
HCG, Total: 15.39 IU/mL
INH: 76.9 pg/mL
Interpretation-AFP: NEGATIVE
MOM FOR HCG: 0.52
MoM for AFP: 0.75
MoM for INH: 0.39
OPEN SPINA BIFIDA: NEGATIVE
Osb Risk: <1:27300 {titer}
TRI 18 SCR RISK EST: NEGATIVE
Trisomy 18 (Edward) Syndrome Interp.: 1:2610 {titer}
UE3 VALUE: 0.9 ng/mL
uE3 Mom: 0.69

## 2013-12-30 ENCOUNTER — Other Ambulatory Visit: Payer: Self-pay | Admitting: Physician Assistant

## 2013-12-30 MED ORDER — PNV-DHA 27-0.6-0.4-300 MG PO CAPS: ORAL_CAPSULE | ORAL | Status: DC

## 2014-01-05 ENCOUNTER — Other Ambulatory Visit: Payer: Self-pay | Admitting: Obstetrics & Gynecology

## 2014-01-05 ENCOUNTER — Ambulatory Visit (HOSPITAL_COMMUNITY)
Admission: RE | Admit: 2014-01-05 | Discharge: 2014-01-05 | Disposition: A | Discharge status: Home / Self Care | Payer: Commercial Managed Care - PPO | Source: Ambulatory Visit | Attending: Obstetrics & Gynecology | Admitting: Obstetrics & Gynecology

## 2014-01-05 DIAGNOSIS — Z3689 Encounter for other specified antenatal screening: Secondary | ICD-10-CM

## 2014-01-05 DIAGNOSIS — Z3481 Encounter for supervision of other normal pregnancy, first trimester: Secondary | ICD-10-CM

## 2014-01-13 ENCOUNTER — Encounter: Payer: Self-pay | Admitting: Advanced Practice Midwife

## 2014-01-13 ENCOUNTER — Ambulatory Visit (INDEPENDENT_AMBULATORY_CARE_PROVIDER_SITE_OTHER): Payer: Commercial Managed Care - PPO | Admitting: Advanced Practice Midwife

## 2014-01-13 VITALS — BP 95/56 | HR 99 | Temp 96.9°F | Wt 128.0 lb

## 2014-01-13 DIAGNOSIS — F419 Anxiety disorder, unspecified: Secondary | ICD-10-CM | POA: Insufficient documentation

## 2014-01-13 DIAGNOSIS — O99342 Other mental disorders complicating pregnancy, second trimester: Secondary | ICD-10-CM

## 2014-01-13 MED ORDER — ZOLPIDEM TARTRATE 10 MG PO TABS: 5.0000 mg | ORAL_TABLET | Freq: Every evening | ORAL | Status: DC | PRN

## 2014-01-13 MED ORDER — HYDROXYZINE PAMOATE 50 MG PO CAPS: 50.0000 mg | ORAL_CAPSULE | Freq: Three times a day (TID) | ORAL | Status: DC | PRN

## 2014-01-13 NOTE — Progress Notes (Signed)
Doing well.  Good fetal movement, denies vaginal bleeding, LOF, cramping.  Reports recent panic attacks, has hx of these, took Klonopin Rx in the past for ADHD.  Recently took some Klonopin during panic attack, but knows she cannot continue to do this.  Wants to know safe medications for anxiety in pregnancy.  Reviewed recommendation for SSRI daily but pt reports she has not done well on these in the past.  She is also not sleeping, and is taking Bendadryl AND Tylenol PM nightly.  Rx for Ambien, stop taking Benadryl, add Vistaril PRN for anxiety.  Will let us know if other anxiety treatment needed.

## 2014-01-13 NOTE — Progress Notes (Signed)
Pt here with c/o anxiety

## 2014-01-13 NOTE — Addendum Note (Signed)
Addended by: Fatima Blank A on: 01/13/2014 10:23 AM   Modules accepted: Level of Service

## 2014-01-16 ENCOUNTER — Encounter: Payer: Commercial Managed Care - PPO | Admitting: Obstetrics & Gynecology

## 2014-01-17 ENCOUNTER — Encounter: Payer: Self-pay | Admitting: Obstetrics & Gynecology

## 2014-02-10 ENCOUNTER — Encounter: Payer: Self-pay | Admitting: Advanced Practice Midwife

## 2014-02-10 ENCOUNTER — Ambulatory Visit (INDEPENDENT_AMBULATORY_CARE_PROVIDER_SITE_OTHER): Payer: Commercial Managed Care - PPO | Admitting: Advanced Practice Midwife

## 2014-02-10 VITALS — BP 113/68 | HR 101 | Wt 130.0 lb

## 2014-02-10 DIAGNOSIS — Z3492 Encounter for supervision of normal pregnancy, unspecified, second trimester: Secondary | ICD-10-CM

## 2014-02-10 NOTE — Patient Instructions (Addendum)
Second Trimester of Pregnancy °The second trimester is from week 13 through week 28, months 4 through 6. The second trimester is often a time when you feel your best. Your body has also adjusted to being pregnant, and you begin to feel better physically. Usually, morning sickness has lessened or quit completely, you may have more energy, and you may have an increase in appetite. The second trimester is also a time when the fetus is growing rapidly. At the end of the sixth month, the fetus is about 9 inches long and weighs about 1½ pounds. You will likely begin to feel the baby move (quickening) between 18 and 20 weeks of the pregnancy. °BODY CHANGES °Your body goes through many changes during pregnancy. The changes vary from woman to woman.  °· Your weight will continue to increase. You will notice your lower abdomen bulging out. °· You may begin to get stretch marks on your hips, abdomen, and breasts. °· You may develop headaches that can be relieved by medicines approved by your health care provider. °· You may urinate more often because the fetus is pressing on your bladder. °· You may develop or continue to have heartburn as a result of your pregnancy. °· You may develop constipation because certain hormones are causing the muscles that push waste through your intestines to slow down. °· You may develop hemorrhoids or swollen, bulging veins (varicose veins). °· You may have back pain because of the weight gain and pregnancy hormones relaxing your joints between the bones in your pelvis and as a result of a shift in weight and the muscles that support your balance. °· Your breasts will continue to grow and be tender. °· Your gums may bleed and may be sensitive to brushing and flossing. °· Dark spots or blotches (chloasma, mask of pregnancy) may develop on your face. This will likely fade after the baby is born. °· A dark line from your belly button to the pubic area (linea nigra) may appear. This will likely fade  after the baby is born. °· You may have changes in your hair. These can include thickening of your hair, rapid growth, and changes in texture. Some women also have hair loss during or after pregnancy, or hair that feels dry or thin. Your hair will most likely return to normal after your baby is born. °WHAT TO EXPECT AT YOUR PRENATAL VISITS °During a routine prenatal visit: °· You will be weighed to make sure you and the fetus are growing normally. °· Your blood pressure will be taken. °· Your abdomen will be measured to track your baby's growth. °· The fetal heartbeat will be listened to. °· Any test results from the previous visit will be discussed. °Your health care provider may ask you: °· How you are feeling. °· If you are feeling the baby move. °· If you have had any abnormal symptoms, such as leaking fluid, bleeding, severe headaches, or abdominal cramping. °· If you have any questions. °Other tests that may be performed during your second trimester include: °· Blood tests that check for: °¨ Low iron levels (anemia). °¨ Gestational diabetes (between 24 and 28 weeks). °¨ Rh antibodies. °· Urine tests to check for infections, diabetes, or protein in the urine. °· An ultrasound to confirm the proper growth and development of the baby. °· An amniocentesis to check for possible genetic problems. °· Fetal screens for spina bifida and Down syndrome. °HOME CARE INSTRUCTIONS  °· Avoid all smoking, herbs, alcohol, and unprescribed   drugs. These chemicals affect the formation and growth of the baby. °· Follow your health care provider's instructions regarding medicine use. There are medicines that are either safe or unsafe to take during pregnancy. °· Exercise only as directed by your health care provider. Experiencing uterine cramps is a good sign to stop exercising. °· Continue to eat regular, healthy meals. °· Wear a good support bra for breast tenderness. °· Do not use hot tubs, steam rooms, or saunas. °· Wear your  seat belt at all times when driving. °· Avoid raw meat, uncooked cheese, cat litter boxes, and soil used by cats. These carry germs that can cause birth defects in the baby. °· Take your prenatal vitamins. °· Try taking a stool softener (if your health care provider approves) if you develop constipation. Eat more high-fiber foods, such as fresh vegetables or fruit and whole grains. Drink plenty of fluids to keep your urine clear or pale yellow. °· Take warm sitz baths to soothe any pain or discomfort caused by hemorrhoids. Use hemorrhoid cream if your health care provider approves. °· If you develop varicose veins, wear support hose. Elevate your feet for 15 minutes, 3-4 times a day. Limit salt in your diet. °· Avoid heavy lifting, wear low heel shoes, and practice good posture. °· Rest with your legs elevated if you have leg cramps or low back pain. °· Visit your dentist if you have not gone yet during your pregnancy. Use a soft toothbrush to brush your teeth and be gentle when you floss. °· A sexual relationship may be continued unless your health care provider directs you otherwise. °· Continue to go to all your prenatal visits as directed by your health care provider. °SEEK MEDICAL CARE IF:  °· You have dizziness. °· You have mild pelvic cramps, pelvic pressure, or nagging pain in the abdominal area. °· You have persistent nausea, vomiting, or diarrhea. °· You have a bad smelling vaginal discharge. °· You have pain with urination. °SEEK IMMEDIATE MEDICAL CARE IF:  °· You have a fever. °· You are leaking fluid from your vagina. °· You have spotting or bleeding from your vagina. °· You have severe abdominal cramping or pain. °· You have rapid weight gain or loss. °· You have shortness of breath with chest pain. °· You notice sudden or extreme swelling of your face, hands, ankles, feet, or legs. °· You have not felt your baby move in over an hour. °· You have severe headaches that do not go away with  medicine. °· You have vision changes. °Document Released: 03/25/2001 Document Revised: 04/05/2013 Document Reviewed: 06/01/2012 °ExitCare® Patient Information ©2015 [employer]. This information is not intended to replace advice given to you by your health care provider. Make sure you discuss any questions you have with your health care provider. °Stress °Stress-related medical problems are becoming increasingly common. °The body has a built-in physical response to stressful situations. Faced with pressure, challenge or danger, we need to react quickly. Our bodies release hormones such as cortisol and adrenaline to help do this. These hormones are part of the "fight or flight" response and affect the metabolic rate, heart rate and blood pressure, resulting in a heightened, stressed state that prepares the body for optimum performance in dealing with a stressful situation. °It is likely that early man required these mechanisms to stay alive, but usually modern stresses do not call for this, and the same hormones released in today's world can damage health and reduce coping ability. °CAUSES °·   Pressure to perform at work, at school or in sports. °· Threats of physical violence. °· Money worries. °· Arguments. °· Family conflicts. °· Divorce or separation from significant other. °· Bereavement. °· New job or unemployment. °· Changes in location. °· Alcohol or drug abuse. °SOMETIMES, THERE IS NO PARTICULAR REASON FOR DEVELOPING STRESS. °Almost all people are at risk of being stressed at some time in their lives. It is important to know that some stress is temporary and some is long term. °· Temporary stress will go away when a situation is resolved. Most people can cope with short periods of stress, and it can often be relieved by relaxing, taking a walk or getting any type of exercise, chatting through issues with friends, or having a good night's sleep. °· Chronic (long-term, continuous) stress is much harder to  deal with. It can be psychologically and emotionally damaging. It can be harmful both for an individual and for friends and family. °SYMPTOMS °Everyone reacts to stress differently. There are some common effects that help us recognize it. In times of extreme stress, people may: °· Shake uncontrollably. °· Breathe faster and deeper than normal (hyperventilate). °· Vomit. °· For people with asthma, stress can trigger an attack. °· For some people, stress may trigger migraine headaches, ulcers, and body pain. °PHYSICAL EFFECTS OF STRESS MAY INCLUDE: °· Loss of energy. °· Skin problems. °· Aches and pains resulting from tense muscles, including neck ache, backache and tension headaches. °· Increased pain from arthritis and other conditions. °· Irregular heart beat (palpitations). °· Periods of irritability or anger. °· Apathy or depression. °· Anxiety (feeling uptight or worrying). °· Unusual behavior. °· Loss of appetite. °· Comfort eating. °· Lack of concentration. °· Loss of, or decreased, sex-drive. °· Increased smoking, drinking, or recreational drug use. °· For women, missed periods. °· Ulcers, joint pain, and muscle pain. °Post-traumatic stress is the stress caused by any serious accident, strong emotional damage, or extremely difficult or violent experience such as rape or war. °Post-traumatic stress victims can experience mixtures of emotions such as fear, shame, depression, guilt or anger. It may include recurrent memories or images that may be haunting. These feelings can last for weeks, months or even years after the traumatic event that triggered them. Specialized treatment, possibly with medicines and psychological therapies, is available. °If stress is causing physical symptoms, severe distress or making it difficult for you to function as normal, it is worth seeing your caregiver. It is important to remember that although stress is a usual part of life, extreme or prolonged stress can lead to other  illnesses that will need treatment. It is better to visit a doctor sooner rather than later. Stress has been linked to the development of high blood pressure and heart disease, as well as insomnia and depression. °There is no diagnostic test for stress since everyone reacts to it differently. But a caregiver will be able to spot the physical symptoms, such as: °· Headaches. °· Shingles. °· Ulcers. °Emotional distress such as intense worry, low mood or irritability should be detected when the doctor asks pertinent questions to identify any underlying problems that might be the cause. In case there are physical reasons for the symptoms, the doctor may also want to do some tests to exclude certain conditions. °If you feel that you are suffering from stress, try to identify the aspects of your life that are causing it. Sometimes you may not be able to change or avoid them, but even a   small change can have a positive ripple effect. A simple lifestyle change can make all the difference. °STRATEGIES THAT CAN HELP DEAL WITH STRESS: °· Delegating or sharing responsibilities. °· Avoiding confrontations. °· Learning to be more assertive. °· Regular exercise. °· Avoid using alcohol or street drugs to cope. °· Eating a healthy, balanced diet, rich in fruit and vegetables and proteins. °· Finding humor or absurdity in stressful situations. °· Never taking on more than you know you can handle comfortably. °· Organizing your time better to get as much done as possible. °· Talking to friends or family and sharing your thoughts and fears. °· Listening to music or relaxation tapes. °· Relaxation techniques like deep breathing, meditation, and yoga. °· Tensing and then relaxing your muscles, starting at the toes and working up to the head and neck. °If you think that you would benefit from help, either in identifying the things that are causing your stress or in learning techniques to help you relax, see a caregiver who is capable of  helping you with this. Rather than relying on medications, it is usually better to try and identify the things in your life that are causing stress and try to deal with them. °There are many techniques of managing stress including counseling, psychotherapy, aromatherapy, yoga, and exercise. Your caregiver can help you determine what is best for you. °Document Released: 06/21/2002 Document Revised: 04/05/2013 Document Reviewed: 05/18/2007 °ExitCare® Patient Information ©2015 [employer]. This information is not intended to replace advice given to you by your health care provider. Make sure you discuss any questions you have with your health care provider. ° °

## 2014-02-10 NOTE — Progress Notes (Signed)
Dealing with anxiety due to husband's ex.  Wants ambien increased to 10mg . Using vistaril for anxiety but not working well. Encouraged to find mental health provider. Discussed with the level of stress, medication is less useful (will not make stress go away) and she needs to find coping strategies until stress is lessened.

## 2014-02-13 ENCOUNTER — Encounter: Payer: Commercial Managed Care - PPO | Admitting: Obstetrics & Gynecology

## 2014-02-13 ENCOUNTER — Encounter: Payer: Self-pay | Admitting: Advanced Practice Midwife

## 2014-03-06 ENCOUNTER — Other Ambulatory Visit: Payer: Self-pay | Admitting: *Deleted

## 2014-03-06 DIAGNOSIS — O219 Vomiting of pregnancy, unspecified: Secondary | ICD-10-CM

## 2014-03-06 DIAGNOSIS — Z72820 Sleep deprivation: Secondary | ICD-10-CM

## 2014-03-06 MED ORDER — HYDROXYZINE PAMOATE 50 MG PO CAPS: 50.0000 mg | ORAL_CAPSULE | Freq: Three times a day (TID) | ORAL | Status: DC | PRN

## 2014-03-06 MED ORDER — ONDANSETRON HCL 4 MG PO TABS
4.0000 mg | ORAL_TABLET | Freq: Three times a day (TID) | ORAL | Status: DC | PRN
Start: 2014-03-06 — End: 2014-07-14

## 2014-03-06 NOTE — Telephone Encounter (Signed)
Pt called requesting that her Vistaril be # 90 since the instructions say TID prn and Rf on Zofran 4 mg.

## 2014-03-13 ENCOUNTER — Encounter: Payer: Commercial Managed Care - PPO | Admitting: Obstetrics & Gynecology

## 2014-03-17 ENCOUNTER — Ambulatory Visit (INDEPENDENT_AMBULATORY_CARE_PROVIDER_SITE_OTHER): Payer: Commercial Managed Care - PPO | Admitting: Family

## 2014-03-17 VITALS — BP 109/67 | HR 100 | Wt 133.0 lb

## 2014-03-17 DIAGNOSIS — Z3481 Encounter for supervision of other normal pregnancy, first trimester: Secondary | ICD-10-CM

## 2014-03-17 DIAGNOSIS — Z23 Encounter for immunization: Secondary | ICD-10-CM

## 2014-03-17 DIAGNOSIS — Z3493 Encounter for supervision of normal pregnancy, unspecified, third trimester: Secondary | ICD-10-CM

## 2014-03-17 LAB — CBC
HEMATOCRIT: 33.9 % — AB (ref 36.0–46.0)
HEMOGLOBIN: 11.7 g/dL — AB (ref 12.0–15.0)
MCH: 31.8 pg (ref 26.0–34.0)
MCHC: 34.5 g/dL (ref 30.0–36.0)
MCV: 92.1 fL (ref 78.0–100.0)
MPV: 10 fL (ref 9.4–12.4)
Platelets: 201 10*3/uL (ref 150–400)
RBC: 3.68 MIL/uL — ABNORMAL LOW (ref 3.87–5.11)
RDW: 12.6 % (ref 11.5–15.5)
WBC: 9.7 10*3/uL (ref 4.0–10.5)

## 2014-03-17 MED ORDER — PANTOPRAZOLE SODIUM 20 MG PO TBEC: 20.0000 mg | DELAYED_RELEASE_TABLET | Freq: Two times a day (BID) | ORAL | Status: DC

## 2014-03-17 NOTE — Progress Notes (Signed)
Doing well.  Desires to increase protonix to BID.  RX changed.  Third trimester labs today.

## 2014-03-18 LAB — HIV ANTIBODY (ROUTINE TESTING W REFLEX): HIV: NONREACTIVE

## 2014-03-18 LAB — RPR

## 2014-03-18 LAB — GLUCOSE TOLERANCE, 1 HOUR (50G) W/O FASTING: Glucose, 1 Hour GTT: 114 mg/dL (ref 70–140)

## 2014-03-31 ENCOUNTER — Encounter: Payer: Commercial Managed Care - PPO | Admitting: Family

## 2014-04-03 ENCOUNTER — Ambulatory Visit (INDEPENDENT_AMBULATORY_CARE_PROVIDER_SITE_OTHER): Payer: Commercial Managed Care - PPO | Admitting: Obstetrics & Gynecology

## 2014-04-03 ENCOUNTER — Encounter: Payer: Self-pay | Admitting: Obstetrics & Gynecology

## 2014-04-03 VITALS — BP 107/63 | HR 94 | Wt 135.0 lb

## 2014-04-03 DIAGNOSIS — Z3481 Encounter for supervision of other normal pregnancy, first trimester: Secondary | ICD-10-CM

## 2014-04-03 NOTE — Progress Notes (Signed)
Routine visit. Good FM. No problems.

## 2014-04-14 NOTE — L&D Delivery Note (Signed)
Delivery Note At 1:23 PM a viable and healthy female was delivered via Vaginal, Spontaneous Delivery complicated by 60 second shoulder dystocia.  Presentation: vertex; Position: Right,, Occiput,, Anterior  Delivery of the head: 1:22 PM First maneuver: 06/01/2014  1:23 PM, McRoberts Second maneuver: 06/01/2014  1:23 PM, Sherral Hammers screw (fetal shoulder rotation) Third maneuver: 06/01/2014  1:23 PM, Posterior arm delivery attempted Fourth maneuver: 06/01/2014  1:23 PM, McRoberts, Suprapubic pressure Fifth maneuver: 06/01/2014  1:23 PM, Posterior arm delivered  APGAR: 7, 9; weight 8 lb 14.2 oz (4031 g). Baby moving both arms well. Skin-to-skin with mom.  Placenta status: Intact, Manual removal.   Cord: 3 vessels with the following complications: None.  Cord pH: NA  Anesthesia: Epidural  Episiotomy: None Lacerations: 1st degree perineal, hemostatic Suture Repair: None Est. Blood Loss (mL): 500  Mom to postpartum.  Baby to Couplet care / Skin to Skin. Placenta to: BS Feeding: Breast Circ: NA  Contraception: Carolann Littler, Thersa Salt 06/01/2014, 5:15 PM

## 2014-04-19 ENCOUNTER — Other Ambulatory Visit: Payer: Self-pay | Admitting: *Deleted

## 2014-04-19 DIAGNOSIS — B009 Herpesviral infection, unspecified: Secondary | ICD-10-CM

## 2014-04-19 MED ORDER — VALACYCLOVIR HCL 500 MG PO TABS: 1000.0000 mg | ORAL_TABLET | Freq: Every day | ORAL | Status: DC

## 2014-04-19 NOTE — Telephone Encounter (Signed)
Rf authoriation for Valtrex sent to [facility] [place].

## 2014-05-01 ENCOUNTER — Ambulatory Visit (INDEPENDENT_AMBULATORY_CARE_PROVIDER_SITE_OTHER): Payer: Commercial Managed Care - PPO | Admitting: Advanced Practice Midwife

## 2014-05-01 DIAGNOSIS — J069 Acute upper respiratory infection, unspecified: Secondary | ICD-10-CM

## 2014-05-01 DIAGNOSIS — R319 Hematuria, unspecified: Secondary | ICD-10-CM

## 2014-05-01 DIAGNOSIS — Z3483 Encounter for supervision of other normal pregnancy, third trimester: Secondary | ICD-10-CM

## 2014-05-01 LAB — POCT URINALYSIS DIPSTICK
Bilirubin, UA: NEGATIVE
Glucose, UA: NEGATIVE
KETONES UA: NEGATIVE
Leukocytes, UA: NEGATIVE
Nitrite, UA: NEGATIVE
PH UA: 6.5
Protein, UA: NEGATIVE
Spec Grav, UA: 1.01
Urobilinogen, UA: 0.2

## 2014-05-01 MED ORDER — GUAIFENESIN ER 600 MG PO TB12
600.0000 mg | ORAL_TABLET | Freq: Two times a day (BID) | ORAL | Status: DC
Start: 2014-05-01 — End: 2014-07-14

## 2014-05-01 NOTE — Progress Notes (Signed)
Doing well. More UCs this week.  Starting to get a cold. Rx Mucinex. Was confused as Rx for Valtrex said BID.[name] Informed her preventative dose is one per day

## 2014-05-01 NOTE — Patient Instructions (Signed)
Upper Respiratory Infection, Adult An upper respiratory infection (URI) is also sometimes known as the common cold. The upper respiratory tract includes the nose, sinuses, throat, trachea, and bronchi. Bronchi are the airways leading to the lungs. Most people improve within 1 week, but symptoms can last up to 2 weeks. A residual cough may last even longer.  CAUSES Many different viruses can infect the tissues lining the upper respiratory tract. The tissues become irritated and inflamed and often become very moist. Mucus production is also common. A cold is contagious. You can easily spread the virus to others by oral contact. This includes kissing, sharing a glass, coughing, or sneezing. Touching your mouth or nose and then touching a surface, which is then touched by another person, can also spread the virus. SYMPTOMS  Symptoms typically develop 1 to 3 days after you come in contact with a cold virus. Symptoms vary from person to person. They may include:  Runny nose.  Sneezing.  Nasal congestion.  Sinus irritation.  Sore throat.  Loss of voice (laryngitis).  Cough.  Fatigue.  Muscle aches.  Loss of appetite.  Headache.  Low-grade fever. DIAGNOSIS  You might diagnose your own cold based on familiar symptoms, since most people get a cold 2 to 3 times a year. Your caregiver can confirm this based on your exam. Most importantly, your caregiver can check that your symptoms are not due to another disease such as strep throat, sinusitis, pneumonia, asthma, or epiglottitis. Blood tests, throat tests, and X-rays are not necessary to diagnose a common cold, but they may sometimes be helpful in excluding other more serious diseases. Your caregiver will decide if any further tests are required. RISKS AND COMPLICATIONS  You may be at risk for a more severe case of the common cold if you smoke cigarettes, have chronic heart disease (such as heart failure) or lung disease (such as asthma), or if  you have a weakened immune system. The very young and very old are also at risk for more serious infections. Bacterial sinusitis, middle ear infections, and bacterial pneumonia can complicate the common cold. The common cold can worsen asthma and chronic obstructive pulmonary disease (COPD). Sometimes, these complications can require emergency medical care and may be life-threatening. PREVENTION  The best way to protect against getting a cold is to practice good hygiene. Avoid oral or hand contact with people with cold symptoms. Wash your hands often if contact occurs. There is no clear evidence that vitamin C, vitamin E, echinacea, or exercise reduces the chance of developing a cold. However, it is always recommended to get plenty of rest and practice good nutrition. TREATMENT  Treatment is directed at relieving symptoms. There is no cure. Antibiotics are not effective, because the infection is caused by a virus, not by bacteria. Treatment may include:  Increased fluid intake. Sports drinks offer valuable electrolytes, sugars, and fluids.  Breathing heated mist or steam (vaporizer or shower).  Eating chicken soup or other clear broths, and maintaining good nutrition.  Getting plenty of rest.  Using gargles or lozenges for comfort.  Controlling fevers with ibuprofen or acetaminophen as directed by your caregiver.  Increasing usage of your inhaler if you have asthma. Zinc gel and zinc lozenges, taken in the first 24 hours of the common cold, can shorten the duration and lessen the severity of symptoms. Pain medicines may help with fever, muscle aches, and throat pain. A variety of non-prescription medicines are available to treat congestion and runny nose. Your caregiver   can make recommendations and may suggest nasal or lung inhalers for other symptoms.  HOME CARE INSTRUCTIONS   Only take over-the-counter or prescription medicines for pain, discomfort, or fever as directed by your  caregiver.  Use a warm mist humidifier or inhale steam from a shower to increase air moisture. This may keep secretions moist and make it easier to breathe.  Drink enough water and fluids to keep your urine clear or pale yellow.  Rest as needed.  Return to work when your temperature has returned to normal or as your caregiver advises. You may need to stay home longer to avoid infecting others. You can also use a face mask and careful hand washing to prevent spread of the virus. SEEK MEDICAL CARE IF:   After the first few days, you feel you are getting worse rather than better.  You need your caregiver's advice about medicines to control symptoms.  You develop chills, worsening shortness of breath, or brown or red sputum. These may be signs of pneumonia.  You develop yellow or brown nasal discharge or pain in the face, especially when you bend forward. These may be signs of sinusitis.  You develop a fever, swollen neck glands, pain with swallowing, or white areas in the back of your throat. These may be signs of strep throat. SEEK IMMEDIATE MEDICAL CARE IF:   You have a fever.  You develop severe or persistent headache, ear pain, sinus pain, or chest pain.  You develop wheezing, a prolonged cough, cough up blood, or have a change in your usual mucus (if you have chronic lung disease).  You develop sore muscles or a stiff neck. Document Released: 09/24/2000 Document Revised: 06/23/2011 Document Reviewed: 07/06/2013 Exit[employer] Patient Information 2015 [employer]. This information is not intended to replace advice given to you by your health care provider. Make sure you discuss any questions you have with your health care provider.  

## 2014-05-03 LAB — CULTURE, URINE COMPREHENSIVE: Colony Count: 40000

## 2014-05-15 ENCOUNTER — Other Ambulatory Visit: Payer: Self-pay | Admitting: Advanced Practice Midwife

## 2014-05-15 ENCOUNTER — Other Ambulatory Visit: Payer: Self-pay | Admitting: Physician Assistant

## 2014-05-15 ENCOUNTER — Ambulatory Visit (INDEPENDENT_AMBULATORY_CARE_PROVIDER_SITE_OTHER): Payer: Commercial Managed Care - PPO | Admitting: Advanced Practice Midwife

## 2014-05-15 ENCOUNTER — Telehealth: Payer: Self-pay | Admitting: Physician Assistant

## 2014-05-15 VITALS — BP 114/69 | HR 108 | Wt 140.0 lb

## 2014-05-15 DIAGNOSIS — Z36 Encounter for antenatal screening of mother: Secondary | ICD-10-CM

## 2014-05-15 DIAGNOSIS — M791 Myalgia: Secondary | ICD-10-CM

## 2014-05-15 DIAGNOSIS — Z3481 Encounter for supervision of other normal pregnancy, first trimester: Secondary | ICD-10-CM

## 2014-05-15 DIAGNOSIS — G4700 Insomnia, unspecified: Secondary | ICD-10-CM

## 2014-05-15 DIAGNOSIS — Z3493 Encounter for supervision of normal pregnancy, unspecified, third trimester: Secondary | ICD-10-CM

## 2014-05-15 DIAGNOSIS — M7918 Myalgia, other site: Secondary | ICD-10-CM

## 2014-05-15 LAB — OB RESULTS CONSOLE GC/CHLAMYDIA
Chlamydia: NEGATIVE
Gonorrhea: NEGATIVE

## 2014-05-15 MED ORDER — COMPLETE NATAL DHA 29-1-200 & 250 MG PO MISC: 1.0000 | Freq: Every day | ORAL | Status: DC

## 2014-05-15 MED ORDER — CYCLOBENZAPRINE HCL 10 MG PO TABS: 5.0000 mg | ORAL_TABLET | Freq: Three times a day (TID) | ORAL | Status: DC | PRN

## 2014-05-15 MED ORDER — COMPLETE NATAL DHA 29-1-200 & 250 MG PO MISC: ORAL | Status: DC

## 2014-05-15 MED ORDER — ZOLPIDEM TARTRATE 10 MG PO TABS: 10.0000 mg | ORAL_TABLET | Freq: Every evening | ORAL | Status: DC | PRN

## 2014-05-15 MED ORDER — CONCEPT DHA 53.5-38-1 MG PO CAPS: 1.0000 | ORAL_CAPSULE | Freq: Every day | ORAL | Status: DC

## 2014-05-15 NOTE — Progress Notes (Signed)
GBS done. C/O significant back pain not associated w/ UC's. Comfort measures, Flexeril sparingly.

## 2014-05-15 NOTE — Patient Instructions (Signed)
Braxton Hicks Contractions °Contractions of the uterus can occur throughout pregnancy. Contractions are not always a sign that you are in labor.  °WHAT ARE BRAXTON HICKS CONTRACTIONS?  °Contractions that occur before labor are called Braxton Hicks contractions, or false labor. Toward the end of pregnancy (32-34 weeks), these contractions can develop more often and may become more forceful. This is not true labor because these contractions do not result in opening (dilatation) and thinning of the cervix. They are sometimes difficult to tell apart from true labor because these contractions can be forceful and people have different pain tolerances. You should not feel embarrassed if you go to the hospital with false labor. Sometimes, the only way to tell if you are in true labor is for your health care provider to look for changes in the cervix. °If there are no prenatal problems or other health problems associated with the pregnancy, it is completely safe to be sent home with false labor and await the onset of true labor. °HOW CAN YOU TELL THE DIFFERENCE BETWEEN TRUE AND FALSE LABOR? °False Labor °· The contractions of false labor are usually shorter and not as hard as those of true labor.   °· The contractions are usually irregular.   °· The contractions are often felt in the front of the lower abdomen and in the groin.   °· The contractions may go away when you walk around or change positions while lying down.   °· The contractions get weaker and are shorter lasting as time goes on.   °· The contractions do not usually become progressively stronger, regular, and closer together as with true labor.   °True Labor °1. Contractions in true labor last 30-70 seconds, become very regular, usually become more intense, and increase in frequency.   °2. The contractions do not go away with walking.   °3. The discomfort is usually felt in the top of the uterus and spreads to the lower abdomen and low back.   °4. True labor can  be determined by your health care provider with an exam. This will show that the cervix is dilating and getting thinner.   °WHAT TO REMEMBER °· Keep up with your usual exercises and follow other instructions given by your health care provider.   °· Take medicines as directed by your health care provider.   °· Keep your regular prenatal appointments.   °· Eat and drink lightly if you think you are going into labor.   °· If Braxton Hicks contractions are making you uncomfortable:   °· Change your position from lying down or resting to walking, or from walking to resting.   °· Sit and rest in a tub of warm water.   °· Drink 2-3 glasses of water. Dehydration may cause these contractions.   °· Do slow and deep breathing several times an hour.   °WHEN SHOULD I SEEK IMMEDIATE MEDICAL CARE? °Seek immediate medical care if: °· Your contractions become stronger, more regular, and closer together.   °· You have fluid leaking or gushing from your vagina.   °· You have a fever.   °· You pass blood-tinged mucus.   °· You have vaginal bleeding.   °· You have continuous abdominal pain.   °· You have low back pain that you never had before.   °· You feel your baby's head pushing down and causing pelvic pressure.   °· Your baby is not moving as much as it used to.   °Document Released: 03/31/2005 Document Revised: 04/05/2013 Document Reviewed: 01/10/2013 °[employer]® Patient Information ©2015 [employer]. This information is not intended to replace advice given to you by your health care   provider. Make sure you discuss any questions you have with your health care provider. ° °Fetal Movement Counts °Patient Name: __________________________________________________ Patient Due Date: ____________________ °Performing a fetal movement count is highly recommended in high-risk pregnancies, but it is good for every pregnant woman to do. Your health care provider may ask you to start counting fetal movements at 28 weeks of the pregnancy. Fetal  movements often increase: °· After eating a full meal. °· After physical activity. °· After eating or drinking something sweet or cold. °· At rest. °Pay attention to when you feel the baby is most active. This will help you notice a pattern of your baby's sleep and wake cycles and what factors contribute to an increase in fetal movement. It is important to perform a fetal movement count at the same time each day when your baby is normally most active.  °HOW TO COUNT FETAL MOVEMENTS °5. Find a quiet and comfortable area to sit or lie down on your left side. Lying on your left side provides the best blood and oxygen circulation to your baby. °6. Write down the day and time on a sheet of paper or in a journal. °7. Start counting kicks, flutters, swishes, rolls, or jabs in a 2-hour period. You should feel at least 10 movements within 2 hours. °8. If you do not feel 10 movements in 2 hours, wait 2-3 hours and count again. Look for a change in the pattern or not enough counts in 2 hours. °SEEK MEDICAL CARE IF: °· You feel less than 10 counts in 2 hours, tried twice. °· There is no movement in over an hour. °· The pattern is changing or taking longer each day to reach 10 counts in 2 hours. °· You feel the baby is not moving as he or she usually does. °Date: ____________ Movements: ____________ Start time: ____________ Finish time: ____________  °Date: ____________ Movements: ____________ Start time: ____________ Finish time: ____________ °Date: ____________ Movements: ____________ Start time: ____________ Finish time: ____________ °Date: ____________ Movements: ____________ Start time: ____________ Finish time: ____________ °Date: ____________ Movements: ____________ Start time: ____________ Finish time: ____________ °Date: ____________ Movements: ____________ Start time: ____________ Finish time: ____________ °Date: ____________ Movements: ____________ Start time: ____________ Finish time: ____________ °Date: ____________  Movements: ____________ Start time: ____________ Finish time: ____________  °Date: ____________ Movements: ____________ Start time: ____________ Finish time: ____________ °Date: ____________ Movements: ____________ Start time: ____________ Finish time: ____________ °Date: ____________ Movements: ____________ Start time: ____________ Finish time: ____________ °Date: ____________ Movements: ____________ Start time: ____________ Finish time: ____________ °Date: ____________ Movements: ____________ Start time: ____________ Finish time: ____________ °Date: ____________ Movements: ____________ Start time: ____________ Finish time: ____________ °Date: ____________ Movements: ____________ Start time: ____________ Finish time: ____________  °Date: ____________ Movements: ____________ Start time: ____________ Finish time: ____________ °Date: ____________ Movements: ____________ Start time: ____________ Finish time: ____________ °Date: ____________ Movements: ____________ Start time: ____________ Finish time: ____________ °Date: ____________ Movements: ____________ Start time: ____________ Finish time: ____________ °Date: ____________ Movements: ____________ Start time: ____________ Finish time: ____________ °Date: ____________ Movements: ____________ Start time: ____________ Finish time: ____________ °Date: ____________ Movements: ____________ Start time: ____________ Finish time: ____________  °Date: ____________ Movements: ____________ Start time: ____________ Finish time: ____________ °Date: ____________ Movements: ____________ Start time: ____________ Finish time: ____________ °Date: ____________ Movements: ____________ Start time: ____________ Finish time: ____________ °Date: ____________ Movements: ____________ Start time: ____________ Finish time: ____________ °Date: ____________ Movements: ____________ Start time: ____________ Finish time: ____________ °Date: ____________ Movements: ____________ Start time:  ____________ Finish time: ____________ °Date: ____________ Movements:   ____________ Start time: ____________ Finish time: ____________  °Date: ____________ Movements: ____________ Start time: ____________ Finish time: ____________ °Date: ____________ Movements: ____________ Start time: ____________ Finish time: ____________ °Date: ____________ Movements: ____________ Start time: ____________ Finish time: ____________ °Date: ____________ Movements: ____________ Start time: ____________ Finish time: ____________ °Date: ____________ Movements: ____________ Start time: ____________ Finish time: ____________ °Date: ____________ Movements: ____________ Start time: ____________ Finish time: ____________ °Date: ____________ Movements: ____________ Start time: ____________ Finish time: ____________  °Date: ____________ Movements: ____________ Start time: ____________ Finish time: ____________ °Date: ____________ Movements: ____________ Start time: ____________ Finish time: ____________ °Date: ____________ Movements: ____________ Start time: ____________ Finish time: ____________ °Date: ____________ Movements: ____________ Start time: ____________ Finish time: ____________ °Date: ____________ Movements: ____________ Start time: ____________ Finish time: ____________ °Date: ____________ Movements: ____________ Start time: ____________ Finish time: ____________ °Date: ____________ Movements: ____________ Start time: ____________ Finish time: ____________  °Date: ____________ Movements: ____________ Start time: ____________ Finish time: ____________ °Date: ____________ Movements: ____________ Start time: ____________ Finish time: ____________ °Date: ____________ Movements: ____________ Start time: ____________ Finish time: ____________ °Date: ____________ Movements: ____________ Start time: ____________ Finish time: ____________ °Date: ____________ Movements: ____________ Start time: ____________ Finish time: ____________ °Date:  ____________ Movements: ____________ Start time: ____________ Finish time: ____________ °Date: ____________ Movements: ____________ Start time: ____________ Finish time: ____________  °Date: ____________ Movements: ____________ Start time: ____________ Finish time: ____________ °Date: ____________ Movements: ____________ Start time: ____________ Finish time: ____________ °Date: ____________ Movements: ____________ Start time: ____________ Finish time: ____________ °Date: ____________ Movements: ____________ Start time: ____________ Finish time: ____________ °Date: ____________ Movements: ____________ Start time: ____________ Finish time: ____________ °Date: ____________ Movements: ____________ Start time: ____________ Finish time: ____________ °Document Released: 04/30/2006 Document Revised: 08/15/2013 Document Reviewed: 01/26/2012 °[employer]® Patient Information ©2015 [employer]. This information is not intended to replace advice given to you by your health care provider. Make sure you discuss any questions you have with your health care provider. ° °

## 2014-05-16 ENCOUNTER — Encounter (HOSPITAL_COMMUNITY): Payer: Self-pay

## 2014-05-16 LAB — GC/CHLAMYDIA PROBE AMP
CT Probe RNA: NEGATIVE
GC PROBE AMP APTIMA: NEGATIVE

## 2014-05-17 LAB — CULTURE, BETA STREP (GROUP B ONLY)

## 2014-05-22 ENCOUNTER — Ambulatory Visit (INDEPENDENT_AMBULATORY_CARE_PROVIDER_SITE_OTHER): Payer: Commercial Managed Care - PPO | Admitting: Obstetrics & Gynecology

## 2014-05-22 VITALS — BP 108/67 | HR 90 | Wt 142.0 lb

## 2014-05-22 DIAGNOSIS — R319 Hematuria, unspecified: Secondary | ICD-10-CM

## 2014-05-22 DIAGNOSIS — Z3481 Encounter for supervision of other normal pregnancy, first trimester: Secondary | ICD-10-CM

## 2014-05-22 LAB — POCT URINALYSIS DIPSTICK
BILIRUBIN UA: NEGATIVE
Glucose, UA: NEGATIVE
Ketones, UA: NEGATIVE
Leukocytes, UA: NEGATIVE
Nitrite, UA: NEGATIVE
Protein, UA: NEGATIVE
Spec Grav, UA: 1.01
Urobilinogen, UA: 0.2
pH, UA: 6

## 2014-05-22 NOTE — Progress Notes (Signed)
Routine visit. Good FM. No problems. Labor precautions reviewed.

## 2014-05-22 NOTE — Progress Notes (Signed)
Pt states she thinks she has UTI and noticed hematuria

## 2014-05-24 ENCOUNTER — Telehealth: Payer: Self-pay

## 2014-05-24 LAB — CULTURE, URINE COMPREHENSIVE
Colony Count: NO GROWTH
Organism ID, Bacteria: NO GROWTH

## 2014-05-24 NOTE — Telephone Encounter (Signed)
Pt calling for urine culture test results- test show no growth. Test was normal.

## 2014-05-29 ENCOUNTER — Telehealth: Payer: Self-pay | Admitting: *Deleted

## 2014-05-29 DIAGNOSIS — B009 Herpesviral infection, unspecified: Secondary | ICD-10-CM

## 2014-05-29 MED ORDER — VALACYCLOVIR HCL 500 MG PO TABS: 1000.0000 mg | ORAL_TABLET | Freq: Every day | ORAL | Status: DC

## 2014-05-29 NOTE — Telephone Encounter (Signed)
Pt called reuesting that her Valtrex be called in for 60 tablets instead of 30 since she takes 2 daily.  This was corrected and [facility] to [facility] [place].

## 2014-05-31 ENCOUNTER — Inpatient Hospital Stay (HOSPITAL_COMMUNITY)
Admission: AD | Admit: 2014-05-31 | Discharge: 2014-05-31 | Disposition: A | Discharge status: Home / Self Care | Payer: Commercial Managed Care - PPO | Source: Ambulatory Visit | Attending: Obstetrics & Gynecology | Admitting: Obstetrics & Gynecology

## 2014-05-31 ENCOUNTER — Encounter (HOSPITAL_COMMUNITY): Payer: Self-pay | Admitting: *Deleted

## 2014-05-31 ENCOUNTER — Encounter: Payer: Commercial Managed Care - PPO | Admitting: Obstetrics & Gynecology

## 2014-05-31 DIAGNOSIS — Z3A39 39 weeks gestation of pregnancy: Secondary | ICD-10-CM

## 2014-05-31 DIAGNOSIS — O471 False labor at or after 37 completed weeks of gestation: Secondary | ICD-10-CM | POA: Diagnosis not present

## 2014-05-31 DIAGNOSIS — Z87891 Personal history of nicotine dependence: Secondary | ICD-10-CM | POA: Diagnosis not present

## 2014-05-31 DIAGNOSIS — O9989 Other specified diseases and conditions complicating pregnancy, childbirth and the puerperium: Secondary | ICD-10-CM | POA: Diagnosis present

## 2014-05-31 HISTORY — DX: Gastro-esophageal reflux disease without esophagitis: K21.9

## 2014-05-31 LAB — URINALYSIS, ROUTINE W REFLEX MICROSCOPIC
Bilirubin Urine: NEGATIVE
Glucose, UA: NEGATIVE mg/dL
HGB URINE DIPSTICK: NEGATIVE
Ketones, ur: NEGATIVE mg/dL
LEUKOCYTES UA: NEGATIVE
Nitrite: NEGATIVE
PROTEIN: NEGATIVE mg/dL
Specific Gravity, Urine: 1.015 (ref 1.005–1.030)
UROBILINOGEN UA: 0.2 mg/dL (ref 0.0–1.0)
pH: 7 (ref 5.0–8.0)

## 2014-05-31 LAB — POCT FERN TEST: POCT Fern Test: NEGATIVE

## 2014-05-31 NOTE — MAU Provider Note (Signed)
History     CSN: [id number]  Arrival date and time: 05/31/14 8250   None     Chief Complaint  Patient presents with  . Rupture of Membranes   HPI 39week 3days OB History    Gravida Para Term Preterm AB TAB SAB Ectopic Multiple Living   3 2 2       2      Clinic  [facility]  Dating  18 week ultrasound  Genetic Screen  Quad:   nml                Anatomic [place]  normal  GTT  Third trimester: 114  TDaP vaccine  03/17/14  Flu vaccine  12/05/13  GBS  Neg  Contraception  Discussing  Baby Food  Breast  Circumcision  Female   Pediatrician  [facility] - [facility] Jule Ser)  Support Person  Husband - Lawerance Cruel today to MAU with reported leakage of fluid at 4:30am. States she woke up and noted her clothes were wet, however no fluid was noted on the sheets. Denies further leakage of fluid. Notes irregular contractions. Continues to note fetal movement. Denies vaginal bleeding. Reports similar history with previous births, however she was noted to be further dilated at those presentations. Lives in [place] about [place] away and nervous to go home since it is so far from hospital.  Past Medical History  Diagnosis Date  . ADHD (attention deficit hyperactivity disorder)   . Anxiety   . Insomnia   . GERD (gastroesophageal reflux disease)     Past Surgical History  Procedure Laterality Date  . Appendectomy    . Wisdom tooth extraction    . Tonsillectomy and adenoidectomy      Family History  Problem Relation Age of Onset  . Hypertension Mother   . Stroke Mother     History  Substance Use Topics  . Smoking status: Former Smoker -- 1.00 packs/day for 7 years    Types: Cigarettes    Quit date: 10/22/2004  . Smokeless tobacco: Never Used  . Alcohol Use: Yes     Comment: socially    Allergies: No Known Allergies  Prescriptions prior to admission  Medication Sig Dispense Refill Last Dose  . hydrOXYzine (VISTARIL) 50 MG capsule Take 1 capsule (50 mg total) by  mouth 3 (three) times daily as needed. 90 capsule 2 Past Week at Unknown time  . pantoprazole (PROTONIX) 20 MG tablet Take 1 tablet (20 mg total) by mouth 2 (two) times daily. 60 tablet 1 05/30/2014 at Unknown time  . Prenat w/o A-FE-Methfol-FA-DHA (PNV-DHA) 27-0.6-0.4-300 MG CAPS Take one capsule daily. 30 capsule 11 05/31/2014 at Unknown time  . Prenat-FeBis-FePro-FA-CA-Omega (COMPLETE NATAL DHA) 29-1-200 & 250 MG MISC Take one tablet daily. 30 each 11 05/31/2014 at Unknown time  . valACYclovir (VALTREX) 500 MG tablet Take 2 tablets (1,000 mg total) by mouth daily. 60 tablet 11 05/31/2014 at Unknown time  . zolpidem (AMBIEN) 10 MG tablet Take 1 tablet (10 mg total) by mouth at bedtime as needed for sleep. 60 tablet 1 05/30/2014 at Unknown time  . cyclobenzaprine (FLEXERIL) 10 MG tablet Take 0.5-1 tablets (5-10 mg total) by mouth every 8 (eight) hours as needed for muscle spasms. 40 tablet 0 prn  . guaiFENesin (MUCINEX) 600 MG 12 hr tablet Take 1 tablet (600 mg total) by mouth 2 (two) times daily. (Patient not taking: Reported on 05/31/2014) 30 tablet 1 Taking  . ondansetron (ZOFRAN) 4 MG tablet Take 1 tablet (  4 mg total) by mouth every 8 (eight) hours as needed for nausea. 60 tablet 0 prn  . Prenat-FeFum-FePo-FA-Omega 3 (CONCEPT DHA) 53.5-38-1 MG CAPS Take 1 tablet by mouth daily. (Patient not taking: Reported on 05/31/2014) 90 capsule 4 Taking  . promethazine (PHENERGAN) 25 MG tablet TAKE 1 TABLET (12.5 MG TOTAL) BY MOUTH EVERY 6 (SIX) HOURS AS NEEDED FOR NAUSEA. 90 tablet 0 prn    Review of Systems  All other systems reviewed and are negative.  Physical Exam   Blood pressure 117/68, pulse 101, temperature 97.8 F (36.6 C), temperature source Oral, resp. rate 16, height 5\' 1"  (1.549 m), weight 142 lb (64.411 kg), last menstrual period 09/21/2013, currently breastfeeding.  Physical Exam  Sterile Speculum Exam: no pooling of fluid noted. Dilation: 2.5 Effacement (%): 80 Station: -2 Presentation:  Vertex Exam by:: D. Theophil Thivierge CNM  MAU Course  Procedures  MDM Sterile Speculum exam with no pooling of fluid noted Ferning negative US shows adequate fluid surrounding baby  Assessment and Plan  - Gave Mrs. Bump option to go home and wait for labor to progress or to stay for observation and be re-evaluated at 1200. Will stay for observation. Will repeat exam at 1200 - Monitor for further leakage of fluid  Update: - Exam at 1200 unchanged. Will discharge with instructions to return if contractions occur regularly. Will return for re-evaluation if other concerns arise.  Annette Cook 05/31/2014, 10:38 AM   Attending: Harolyn Rutherford  Evaluation and management procedures were performed by Resident physician under my supervision/collaboration. Chart reviewed, patient examined by me and I agree with management and plan. I did SVEs: membranes palpated. And bedside US: subjectively  Normal AFV  ASSESSMENT: 1. False labor after 37 completed weeks of gestation    PLAN: Discharge with labor precautions   Medication List    STOP taking these medications        CONCEPT DHA 53.5-38-1 MG Caps      TAKE these medications        COMPLETE NATAL DHA 29-1-200 & 250 MG Misc  Take one tablet daily.     cyclobenzaprine 10 MG tablet  Commonly known as:  FLEXERIL  Take 0.5-1 tablets (5-10 mg total) by mouth every 8 (eight) hours as needed for muscle spasms.     guaiFENesin 600 MG 12 hr tablet  Commonly known as:  MUCINEX  Take 1 tablet (600 mg total) by mouth 2 (two) times daily.     hydrOXYzine 50 MG capsule  Commonly known as:  VISTARIL  Take 1 capsule (50 mg total) by mouth 3 (three) times daily as needed.     ondansetron 4 MG tablet  Commonly known as:  ZOFRAN  Take 1 tablet (4 mg total) by mouth every 8 (eight) hours as needed for nausea.     pantoprazole 20 MG tablet  Commonly known as:  PROTONIX  Take 1 tablet (20 mg total) by mouth 2 (two) times daily.     PNV-DHA  27-0.6-0.4-300 MG Caps  Take one capsule daily.     promethazine 25 MG tablet  Commonly known as:  PHENERGAN  TAKE 1 TABLET (12.5 MG TOTAL) BY MOUTH EVERY 6 (SIX) HOURS AS NEEDED FOR NAUSEA.     valACYclovir 500 MG tablet  Commonly known as:  VALTREX  Take 2 tablets (1,000 mg total) by mouth daily.     zolpidem 10 MG tablet  Commonly known as:  AMBIEN  Take 1 tablet (10 mg total) by mouth  at bedtime as needed for sleep.       Follow-up Information    Follow up with [facility] [place].   Specialty:  Obstetrics and Gynecology   Why:  Keep your scheduled prenatal appointment   Contact information:   [place], [place], CNM 05/31/2014 1:46 PM

## 2014-05-31 NOTE — MAU Note (Signed)
C/o SROM @ 0430 nthis AM

## 2014-05-31 NOTE — Discharge Instructions (Signed)
Braxton Hicks Contractions °Contractions of the uterus can occur throughout pregnancy. Contractions are not always a sign that you are in labor.  °WHAT ARE BRAXTON HICKS CONTRACTIONS?  °Contractions that occur before labor are called Braxton Hicks contractions, or false labor. Toward the end of pregnancy (32-34 weeks), these contractions can develop more often and may become more forceful. This is not true labor because these contractions do not result in opening (dilatation) and thinning of the cervix. They are sometimes difficult to tell apart from true labor because these contractions can be forceful and people have different pain tolerances. You should not feel embarrassed if you go to the hospital with false labor. Sometimes, the only way to tell if you are in true labor is for your health care provider to look for changes in the cervix. °If there are no prenatal problems or other health problems associated with the pregnancy, it is completely safe to be sent home with false labor and await the onset of true labor. °HOW CAN YOU TELL THE DIFFERENCE BETWEEN TRUE AND FALSE LABOR? °False Labor °· The contractions of false labor are usually shorter and not as hard as those of true labor.   °· The contractions are usually irregular.   °· The contractions are often felt in the front of the lower abdomen and in the groin.   °· The contractions may go away when you walk around or change positions while lying down.   °· The contractions get weaker and are shorter lasting as time goes on.   °· The contractions do not usually become progressively stronger, regular, and closer together as with true labor.   °True Labor °· Contractions in true labor last 30-70 seconds, become very regular, usually become more intense, and increase in frequency.   °· The contractions do not go away with walking.   °· The discomfort is usually felt in the top of the uterus and spreads to the lower abdomen and low back.   °· True labor can be  determined by your health care provider with an exam. This will show that the cervix is dilating and getting thinner.   °WHAT TO REMEMBER °· Keep up with your usual exercises and follow other instructions given by your health care provider.   °· Take medicines as directed by your health care provider.   °· Keep your regular prenatal appointments.   °· Eat and drink lightly if you think you are going into labor.   °· If Braxton Hicks contractions are making you uncomfortable:   °¨ Change your position from lying down or resting to walking, or from walking to resting.   °¨ Sit and rest in a tub of warm water.   °¨ Drink 2-3 glasses of water. Dehydration may cause these contractions.   °¨ Do slow and deep breathing several times an hour.   °WHEN SHOULD I SEEK IMMEDIATE MEDICAL CARE? °Seek immediate medical care if: °· Your contractions become stronger, more regular, and closer together.   °· You have fluid leaking or gushing from your vagina.   °· You have a fever.   °· You pass blood-tinged mucus.   °· You have vaginal bleeding.   °· You have continuous abdominal pain.   °· You have low back pain that you never had before.   °· You feel your baby's head pushing down and causing pelvic pressure.   °· Your baby is not moving as much as it used to.   °Document Released: 03/31/2005 Document Revised: 04/05/2013 Document Reviewed: 01/10/2013 °ExitCare® Patient Information ©2015 [employer]. This information is not intended to replace advice given to you by your health care   provider. Make sure you discuss any questions you have with your health care provider. ° °

## 2014-06-01 ENCOUNTER — Inpatient Hospital Stay (HOSPITAL_COMMUNITY)
Admission: AD | Admit: 2014-06-01 | Discharge: 2014-06-02 | DRG: 775 | Disposition: A | Discharge status: Home / Self Care | Payer: Commercial Managed Care - PPO | Source: Ambulatory Visit | Attending: Obstetrics & Gynecology | Admitting: Obstetrics & Gynecology

## 2014-06-01 ENCOUNTER — Inpatient Hospital Stay (HOSPITAL_COMMUNITY): Payer: Commercial Managed Care - PPO | Admitting: Anesthesiology

## 2014-06-01 ENCOUNTER — Encounter (HOSPITAL_COMMUNITY): Payer: Self-pay | Admitting: Anesthesiology

## 2014-06-01 DIAGNOSIS — Z3A39 39 weeks gestation of pregnancy: Secondary | ICD-10-CM

## 2014-06-01 DIAGNOSIS — Z87891 Personal history of nicotine dependence: Secondary | ICD-10-CM

## 2014-06-01 DIAGNOSIS — Z823 Family history of stroke: Secondary | ICD-10-CM

## 2014-06-01 DIAGNOSIS — Z8249 Family history of ischemic heart disease and other diseases of the circulatory system: Secondary | ICD-10-CM

## 2014-06-01 DIAGNOSIS — O471 False labor at or after 37 completed weeks of gestation: Secondary | ICD-10-CM | POA: Diagnosis present

## 2014-06-01 DIAGNOSIS — O9962 Diseases of the digestive system complicating childbirth: Secondary | ICD-10-CM | POA: Diagnosis present

## 2014-06-01 DIAGNOSIS — IMO0001 Reserved for inherently not codable concepts without codable children: Secondary | ICD-10-CM

## 2014-06-01 DIAGNOSIS — Z3A40 40 weeks gestation of pregnancy: Secondary | ICD-10-CM | POA: Diagnosis present

## 2014-06-01 DIAGNOSIS — K219 Gastro-esophageal reflux disease without esophagitis: Secondary | ICD-10-CM | POA: Diagnosis present

## 2014-06-01 LAB — CBC
HEMATOCRIT: 40 % (ref 36.0–46.0)
Hemoglobin: 13.4 g/dL (ref 12.0–15.0)
MCH: 31.2 pg (ref 26.0–34.0)
MCHC: 33.5 g/dL (ref 30.0–36.0)
MCV: 93.2 fL (ref 78.0–100.0)
Platelets: 161 10*3/uL (ref 150–400)
RBC: 4.29 MIL/uL (ref 3.87–5.11)
RDW: 13.8 % (ref 11.5–15.5)
WBC: 12.2 10*3/uL — ABNORMAL HIGH (ref 4.0–10.5)

## 2014-06-01 MED ORDER — OXYTOCIN BOLUS FROM INFUSION
500.0000 mL | INTRAVENOUS | Status: DC
Start: 2014-06-01 — End: 2014-06-01

## 2014-06-01 MED ORDER — OXYCODONE-ACETAMINOPHEN 5-325 MG PO TABS: 2.0000 | ORAL_TABLET | ORAL | Status: DC | PRN

## 2014-06-01 MED ORDER — PHENYLEPHRINE 40 MCG/ML (10ML) SYRINGE FOR IV PUSH (FOR BLOOD PRESSURE SUPPORT)
80.0000 ug | PREFILLED_SYRINGE | INTRAVENOUS | Status: DC | PRN
  Filled 2014-06-01: qty 2

## 2014-06-01 MED ORDER — DIBUCAINE 1 % RE OINT
1.0000 "application " | TOPICAL_OINTMENT | RECTAL | Status: DC | PRN
  Administered 2014-06-01: 1 via RECTAL
  Filled 2014-06-01: qty 28

## 2014-06-01 MED ORDER — ONDANSETRON HCL 4 MG/2ML IJ SOLN: 4.0000 mg | Freq: Four times a day (QID) | INTRAMUSCULAR | Status: DC | PRN

## 2014-06-01 MED ORDER — EPHEDRINE 5 MG/ML INJ
10.0000 mg | INTRAVENOUS | Status: DC | PRN
  Filled 2014-06-01: qty 2

## 2014-06-01 MED ORDER — OXYTOCIN 40 UNITS IN LACTATED RINGERS INFUSION - SIMPLE MED: 62.5000 mL/h | INTRAVENOUS | Status: DC

## 2014-06-01 MED ORDER — IBUPROFEN 600 MG PO TABS
600.0000 mg | ORAL_TABLET | Freq: Four times a day (QID) | ORAL | Status: DC
  Administered 2014-06-01 – 2014-06-02 (×4): 600 mg via ORAL
  Filled 2014-06-01 (×4): qty 1

## 2014-06-01 MED ORDER — PHENYLEPHRINE 40 MCG/ML (10ML) SYRINGE FOR IV PUSH (FOR BLOOD PRESSURE SUPPORT)
80.0000 ug | PREFILLED_SYRINGE | INTRAVENOUS | Status: DC | PRN
  Filled 2014-06-01: qty 20
  Filled 2014-06-01: qty 2

## 2014-06-01 MED ORDER — LACTATED RINGERS IV SOLN: 500.0000 mL | INTRAVENOUS | Status: DC | PRN

## 2014-06-01 MED ORDER — LIDOCAINE HCL (PF) 1 % IJ SOLN
30.0000 mL | INTRAMUSCULAR | Status: DC | PRN
  Filled 2014-06-01: qty 30

## 2014-06-01 MED ORDER — OXYTOCIN 40 UNITS IN LACTATED RINGERS INFUSION - SIMPLE MED
62.5000 mL/h | INTRAVENOUS | Status: DC
  Administered 2014-06-01: 62.5 mL/h via INTRAVENOUS
  Filled 2014-06-01: qty 1000

## 2014-06-01 MED ORDER — FLEET ENEMA 7-19 GM/118ML RE ENEM: 1.0000 | ENEMA | RECTAL | Status: DC | PRN

## 2014-06-01 MED ORDER — ACETAMINOPHEN 325 MG PO TABS: 650.0000 mg | ORAL_TABLET | ORAL | Status: DC | PRN

## 2014-06-01 MED ORDER — LIDOCAINE HCL (PF) 1 % IJ SOLN
INTRAMUSCULAR | Status: DC | PRN
  Administered 2014-06-01: 4 mL
  Administered 2014-06-01: 3 mL

## 2014-06-01 MED ORDER — OXYCODONE-ACETAMINOPHEN 5-325 MG PO TABS
1.0000 | ORAL_TABLET | ORAL | Status: DC | PRN
  Administered 2014-06-02: 1 via ORAL
  Filled 2014-06-01: qty 1

## 2014-06-01 MED ORDER — OXYTOCIN BOLUS FROM INFUSION: 500.0000 mL | INTRAVENOUS | Status: DC

## 2014-06-01 MED ORDER — FENTANYL 2.5 MCG/ML BUPIVACAINE 1/10 % EPIDURAL INFUSION (WH - ANES)
14.0000 mL/h | INTRAMUSCULAR | Status: DC | PRN
  Administered 2014-06-01: 14 mL/h via EPIDURAL
  Filled 2014-06-01: qty 125

## 2014-06-01 MED ORDER — FENTANYL CITRATE 0.05 MG/ML IJ SOLN: 100.0000 ug | INTRAMUSCULAR | Status: DC | PRN

## 2014-06-01 MED ORDER — PRENATAL MULTIVITAMIN CH
1.0000 | ORAL_TABLET | Freq: Every day | ORAL | Status: DC
  Administered 2014-06-02: 1 via ORAL
  Filled 2014-06-01: qty 1

## 2014-06-01 MED ORDER — ONDANSETRON HCL 4 MG PO TABS: 4.0000 mg | ORAL_TABLET | ORAL | Status: DC | PRN

## 2014-06-01 MED ORDER — SIMETHICONE 80 MG PO CHEW: 80.0000 mg | CHEWABLE_TABLET | ORAL | Status: DC | PRN

## 2014-06-01 MED ORDER — MISOPROSTOL 200 MCG PO TABS
800.0000 ug | ORAL_TABLET | Freq: Once | ORAL | Status: AC
  Administered 2014-06-01: 800 ug via RECTAL

## 2014-06-01 MED ORDER — OXYCODONE-ACETAMINOPHEN 5-325 MG PO TABS: 1.0000 | ORAL_TABLET | ORAL | Status: DC | PRN

## 2014-06-01 MED ORDER — DIPHENHYDRAMINE HCL 25 MG PO CAPS: 25.0000 mg | ORAL_CAPSULE | Freq: Four times a day (QID) | ORAL | Status: DC | PRN

## 2014-06-01 MED ORDER — SENNOSIDES-DOCUSATE SODIUM 8.6-50 MG PO TABS
2.0000 | ORAL_TABLET | ORAL | Status: DC
  Administered 2014-06-02: 2 via ORAL
  Filled 2014-06-01: qty 2

## 2014-06-01 MED ORDER — FENTANYL 2.5 MCG/ML BUPIVACAINE 1/10 % EPIDURAL INFUSION (WH - ANES)
INTRAMUSCULAR | Status: DC | PRN
  Administered 2014-06-01: 13 mL/h via EPIDURAL

## 2014-06-01 MED ORDER — ONDANSETRON HCL 4 MG/2ML IJ SOLN: 4.0000 mg | INTRAMUSCULAR | Status: DC | PRN

## 2014-06-01 MED ORDER — LACTATED RINGERS IV SOLN: INTRAVENOUS | Status: DC

## 2014-06-01 MED ORDER — BENZOCAINE-MENTHOL 20-0.5 % EX AERO
1.0000 "application " | INHALATION_SPRAY | CUTANEOUS | Status: DC | PRN
  Administered 2014-06-01: 1 via TOPICAL
  Filled 2014-06-01: qty 56

## 2014-06-01 MED ORDER — WITCH HAZEL-GLYCERIN EX PADS
1.0000 "application " | MEDICATED_PAD | CUTANEOUS | Status: DC | PRN
  Administered 2014-06-01: 1 via TOPICAL

## 2014-06-01 MED ORDER — ZOLPIDEM TARTRATE 5 MG PO TABS: 5.0000 mg | ORAL_TABLET | Freq: Every evening | ORAL | Status: DC | PRN

## 2014-06-01 MED ORDER — DIPHENHYDRAMINE HCL 50 MG/ML IJ SOLN: 12.5000 mg | INTRAMUSCULAR | Status: DC | PRN

## 2014-06-01 MED ORDER — MISOPROSTOL 200 MCG PO TABS
ORAL_TABLET | ORAL | Status: AC
  Filled 2014-06-01: qty 4

## 2014-06-01 MED ORDER — CITRIC ACID-SODIUM CITRATE 334-500 MG/5ML PO SOLN: 30.0000 mL | ORAL | Status: DC | PRN

## 2014-06-01 MED ORDER — LACTATED RINGERS IV SOLN: 500.0000 mL | Freq: Once | INTRAVENOUS | Status: DC

## 2014-06-01 NOTE — Progress Notes (Signed)
Patient ID: EMONI WHITWORTH, female   DOB: 07-13-1985, 29 y.o.   MRN: [id number] CAELIN ROSEN is a 29 y.o. G3P2002 at [redacted]w[redacted]d.  Subjective: Mild pelvic pressure, otherwise comfortable w/ epidural/   Objective: BP 113/77 mmHg  Pulse 72  Resp 16  Ht 5\' 1"  (1.549 m)  Wt 64.411 kg (142 lb)  BMI 26.84 kg/m2  SpO2 99%  LMP 09/21/2013   FHT:  FHR: 150 bpm, variability: mod,  accelerations:  15x15,  decelerations:  none UC:   Q 3-5 minutes, Strong  Dilation: 9 Effacement (%): 100 Cervical Position: Anterior Station: +1 Presentation: Vertex Exam by:: Kelso Bibby, CNM  Labs: Results for orders placed or performed during the hospital encounter of 06/01/14 (from the past 24 hour(s))  CBC     Status: Abnormal   Collection Time: 06/01/14  9:10 AM  Result Value Ref Range   WBC 12.2 (H) 4.0 - 10.5 K/uL   RBC 4.29 3.87 - 5.11 MIL/uL   Hemoglobin 13.4 12.0 - 15.0 g/dL   HCT 40.0 36.0 - 46.0 %   MCV 93.2 78.0 - 100.0 fL   MCH 31.2 26.0 - 34.0 pg   MCHC 33.5 30.0 - 36.0 g/dL   RDW 13.8 11.5 - 15.5 %   Platelets 161 150 - 400 K/uL  Type and screen     Status: None (Preliminary result)   Collection Time: 06/01/14  9:10 AM  Result Value Ref Range   ABO/RH(D) A NEG    Antibody Screen PENDING    Sample Expiration 06/04/2014     Assessment / Plan: [redacted]w[redacted]d week IUP Labor: Transition Fetal Wellbeing:  Category I Pain Control:  Epidural Anticipated MOD:  SVD  Manya Silvas, CNM 06/01/2014 12:52 PM

## 2014-06-01 NOTE — Anesthesia Postprocedure Evaluation (Signed)
  Anesthesia Post-op Note  Patient: Annette Cook  Procedure(s) Performed: * No procedures listed *  Patient Location: PACU and Mother/Baby  Anesthesia Type:Epidural  Level of Consciousness: awake, alert , oriented and patient cooperative  Airway and Oxygen Therapy: Patient Spontanous Breathing  Post-op Pain: none  Post-op Assessment: Post-op Vital signs reviewed, Patient's Cardiovascular Status Stable, Respiratory Function Stable, Patent Airway, No signs of Nausea or vomiting, Adequate PO intake, Pain level controlled, No headache, No backache, No residual numbness and No residual motor weakness  Post-op Vital Signs: Reviewed and stable  Last Vitals:  Filed Vitals:   06/01/14 1637  BP: 100/52  Pulse: 71  Temp: 36.9 C  Resp: 20    Complications: No apparent anesthesia complications

## 2014-06-01 NOTE — H&P (Signed)
LABOR ADMISSION HISTORY AND PHYSICAL  Annette Cook is a 29 y.o. female G53P2002 with IUP at [redacted]w[redacted]d by 18week [place] presenting for active labor. She reports +FMs, LOF, no VB, no blurry vision, headaches. Reports intense contractions every 3-15minutes. She plans on breast feeding. She has not made a decision on birth control. Presented to the MAU on 2/17 with possible leakage of fluid, however was dilated 2.5cm, no ferning noted, and US showing adequate fluid surrounding baby. She was monitored in the MAU and discharged home with instructions to return once contractions become more regular.   Prenatal History/Complications:  Past Medical History: Past Medical History  Diagnosis Date  . ADHD (attention deficit hyperactivity disorder)   . Anxiety   . Insomnia   . GERD (gastroesophageal reflux disease)     Past Surgical History: Past Surgical History  Procedure Laterality Date  . Appendectomy    . Wisdom tooth extraction    . Tonsillectomy and adenoidectomy      Obstetrical History: OB History    Gravida Para Term Preterm AB TAB SAB Ectopic Multiple Living   3 2 2       2       Social History: History   Social History  . Marital Status: Married    Spouse Name: N/A  . Number of Children: N/A  . Years of Education: N/A   Occupational History  . front desk coordinator    Social History Main Topics  . Smoking status: Former Smoker -- 1.00 packs/day for 7 years    Types: Cigarettes    Quit date: 10/22/2004  . Smokeless tobacco: Never Used  . Alcohol Use: Yes     Comment: socially  . Drug Use: No  . Sexual Activity:    Partners: Male   Other Topics Concern  . Not on file   Social History Narrative   Marital Status: Married Barrister's clerk)    Children:  Son Nurse, learning disability) Daughter (Remi)    Pets: None    Living Situation: Lives with husband, step-daughter & children.    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      Alcohol Use:  None   Drug Use:  None   Diet:  Regular   Exercise:  None   Hobbies: Fishing ([place])           Family History: Family History  Problem Relation Age of Onset  . Hypertension Mother   . Stroke Mother     Allergies: No Known Allergies  Prescriptions prior to admission  Medication Sig Dispense Refill Last Dose  . cyclobenzaprine (FLEXERIL) 10 MG tablet Take 0.5-1 tablets (5-10 mg total) by mouth every 8 (eight) hours as needed for muscle spasms. 40 tablet 0 prn  . guaiFENesin (MUCINEX) 600 MG 12 hr tablet Take 1 tablet (600 mg total) by mouth 2 (two) times daily. (Patient not taking: Reported on 05/31/2014) 30 tablet 1 Taking  . hydrOXYzine (VISTARIL) 50 MG capsule Take 1 capsule (50 mg total) by mouth 3 (three) times daily as needed. 90 capsule 2 Past Week at Unknown time  . ondansetron (ZOFRAN) 4 MG tablet Take 1 tablet (4 mg total) by mouth every 8 (eight) hours as needed for nausea. 60 tablet 0 prn  . pantoprazole (PROTONIX) 20 MG tablet Take 1 tablet (20 mg total) by mouth 2 (two) times daily. 60 tablet 1  05/30/2014 at Unknown time  . Prenat w/o A-FE-Methfol-FA-DHA (PNV-DHA) 27-0.6-0.4-300 MG CAPS Take one capsule daily. 30 capsule 11 05/31/2014 at Unknown time  . Prenat-FeBis-FePro-FA-CA-Omega (COMPLETE NATAL DHA) 29-1-200 & 250 MG MISC Take one tablet daily. 30 each 11 05/31/2014 at Unknown time  . promethazine (PHENERGAN) 25 MG tablet TAKE 1 TABLET (12.5 MG TOTAL) BY MOUTH EVERY 6 (SIX) HOURS AS NEEDED FOR NAUSEA. 90 tablet 0 prn  . valACYclovir (VALTREX) 500 MG tablet Take 2 tablets (1,000 mg total) by mouth daily. 60 tablet 11 05/31/2014 at Unknown time  . zolpidem (AMBIEN) 10 MG tablet Take 1 tablet (10 mg total) by mouth at bedtime as needed for sleep. 60 tablet 1 05/30/2014 at Unknown time     Review of Systems   All systems reviewed and negative except as stated in HPI  Last menstrual period  09/21/2013, currently breastfeeding. General appearance: alert, cooperative and moderate distress (secondary to contractions) Lungs: No increased work of breathing Presentation: cephalic Fetal monitoringBaseline: 140 bpm, Variability: Moderate, Accelerations: Reactive and Decelerations: Absent Uterine activityFrequency: Every 4 minutes   Dilation: 6 Effacement (%): 80 Cervical Position: Middle Station: -2 Presentation: Vertex Exam by:: Annette Glassing RN   Prenatal labs: ABO, Rh: A/NEG/-- (08/26 1050) Antibody: NEG (08/26 1050) Rubella:   RPR: NON REAC (12/04 1012)  HBsAg: NEGATIVE (08/26 1050)  HIV: NONREACTIVE (12/04 1012)  GBS:     Clinic  [facility]  Dating  18 week ultrasound  Genetic Screen  Quad:   nml                Anatomic [place]  normal  GTT  Third trimester: 114  TDaP vaccine  03/17/14  Flu vaccine  12/05/13  GBS  Neg  Contraception  Discussing  Baby Food  Breast  Circumcision  Female   Pediatrician  [place] [facility] - [place] Jule Ser)  Support Person  Husband - Montine Circle    Results for orders placed or performed during the hospital encounter of 05/31/14 (from the past 24 hour(s))  [place] Test   Collection Time: 05/31/14 11:30 AM  Result Value Ref Range   POCT Fern Test Negative = intact amniotic membranes     Patient Active Problem List   Diagnosis Date Noted  . Anxiety during pregnancy in second trimester, antepartum 01/13/2014  . Encounter for supervision of other normal pregnancy in first trimester 12/07/2013  . Acute stress reaction 07/08/2013  . Pain in throat 04/18/2013  . Generalized anxiety disorder 10/22/2012  . Insomnia 10/22/2012  . Panic attack 10/22/2012  . ADHD (attention deficit hyperactivity disorder) 10/22/2012  . Herpes 12/02/2011    Assessment: Annette Cook is a 29 y.o. G3P2002 at [redacted]w[redacted]d here for active labor. - Will continue to monitor q2hr or sooner as indicated - Epidural for pain - GBS negative - Plans to breast feed - Has not  decided on form of birth control after discharge  Lorna Few 06/01/2014, 9:11 AM  I was present for the exam and agree with above. Pt requesting to be CNM-only pt.   [place], [place] 06/01/2014 12:51 PM

## 2014-06-01 NOTE — Anesthesia Preprocedure Evaluation (Signed)
Anesthesia Evaluation  Patient identified by MRN, date of birth, ID band Patient awake    Reviewed: Allergy & Precautions, NPO status , Patient's Chart, lab work & pertinent test results  Airway Mallampati: II  TM Distance: >3 FB Neck ROM: Full    Dental no notable dental hx. (+) Teeth Intact   Pulmonary former smoker,  breath sounds clear to auscultation  Pulmonary exam normal       Cardiovascular negative cardio ROS  Rhythm:Regular Rate:Normal     Neuro/Psych PSYCHIATRIC DISORDERS Anxiety ADHDnegative neurological ROS     GI/Hepatic Neg liver ROS, GERD-  Medicated and Controlled,  Endo/Other    Renal/GU negative Renal ROS  negative genitourinary   Musculoskeletal negative musculoskeletal ROS (+)   Abdominal   Peds  Hematology   Anesthesia Other Findings   Reproductive/Obstetrics (+) Pregnancy HSV                             Anesthesia Physical Anesthesia Plan  ASA: II  Anesthesia Plan: Epidural   Post-op Pain Management:    Induction:   Airway Management Planned: Natural Airway  Additional Equipment:   Intra-op Plan:   Post-operative Plan:   Informed Consent: I have reviewed the patients History and Physical, chart, labs and discussed the procedure including the risks, benefits and alternatives for the proposed anesthesia with the patient or authorized representative who has indicated his/her understanding and acceptance.     Plan Discussed with: Anesthesiologist  Anesthesia Plan Comments:         Anesthesia Quick Evaluation

## 2014-06-01 NOTE — Lactation Note (Signed)
This note was copied from the chart of Annette Cook. Lactation Consultation Note  Patient Name: Annette Kayren Holck [id number] Date: 06/01/2014 Reason for consult: Initial assessment of this mom and baby at 6 hours pp. This is mom's third child and she is still nursing her 29 yo.  Mom states she has not had difficulty nursing her newborn thus far and her initial LATCH score=10 per RN assessment.  LC encouraged frequent STS and cue feedings. Mom encouraged to feed baby 8-12 times/24 hours and with feeding cues. LC encouraged review of Baby and Me pp 9, 14 and 20-25 for STS and BF information. LC provided [employer] Resource brochure and reviewed [facility] services and list of community and web site resources.     Maternal Data Formula Feeding for Exclusion: No Has patient been taught Hand Expression?: Yes (experienced breastfeeding mom) Does the patient have breastfeeding experience prior to this delivery?: Yes  Feeding Feeding Type: Breast Fed Length of feed: 15 min  LATCH Score/Interventions             initial LATCH score=10 per RN assessment.          Lactation Tools Discussed/Used   STS, cue feedings, hand expression Encouraged to always feed newborn first if tandem nursing her 29 yo  Consult Status Consult Status: Follow-up Date: 06/02/14 Follow-up type: In-patient    Junious Dresser [facility] 06/01/2014, 7:55 PM

## 2014-06-01 NOTE — Progress Notes (Signed)
[name] was referred for history of depression/anxiety.  Referral is screened out by Clinical Social Worker because none of the following criteria appear to apply: -History of anxiety/depression during this pregnancy, or of post-partum depression. - Diagnosis of anxiety and/or depression within last 3 years - History of depression due to pregnancy loss/loss of child or -[name]'s symptoms are currently being treated with medication and/or therapy ([name] is currently prescribed Vistaril).  Please contact the Clinical Social Worker if needs arise or upon [name] request.   Annette Cook, [place] Social Worker [phone]

## 2014-06-01 NOTE — Anesthesia Procedure Notes (Signed)
Epidural Patient location during procedure: OB Start time: 06/01/2014 9:44 AM  Staffing Anesthesiologist: Celise Bazar A. Performed by: anesthesiologist   Preanesthetic Checklist Completed: patient identified, site marked, surgical consent, pre-op evaluation, timeout performed, IV checked, risks and benefits discussed and monitors and equipment checked  Epidural Patient position: sitting Prep: site prepped and draped and DuraPrep Patient monitoring: continuous pulse ox and blood pressure Approach: midline Location: L3-L4 Injection technique: LOR air  Needle:  Needle type: Tuohy  Needle gauge: 17 G Needle length: 9 cm and 9 Needle insertion depth: 4 cm Catheter type: closed end flexible Catheter size: 19 Gauge Catheter at skin depth: 9 cm Test dose: negative and Other  Assessment Events: blood not aspirated, injection not painful, no injection resistance, negative IV test and no paresthesia  Additional Notes Patient identified. Risks and benefits discussed including failed block, incomplete  Pain control, post dural puncture headache, nerve damage, paralysis, blood pressure Changes, nausea, vomiting, reactions to medications-both toxic and allergic and post Partum back pain. All questions were answered. Patient expressed understanding and wished to proceed. Sterile technique was used throughout procedure. Epidural site was Dressed with sterile barrier dressing. No paresthesias, signs of intravascular injection Or signs of intrathecal spread were encountered.  Patient was more comfortable after the epidural was dosed. Please see RN's note for documentation of vital signs and FHR which are stable.

## 2014-06-02 LAB — RPR: RPR Ser Ql: NONREACTIVE

## 2014-06-02 LAB — CBC
HCT: 32.1 % — ABNORMAL LOW (ref 36.0–46.0)
Hemoglobin: 10.9 g/dL — ABNORMAL LOW (ref 12.0–15.0)
MCH: 31.5 pg (ref 26.0–34.0)
MCHC: 34 g/dL (ref 30.0–36.0)
MCV: 92.8 fL (ref 78.0–100.0)
PLATELETS: 117 10*3/uL — AB (ref 150–400)
RBC: 3.46 MIL/uL — ABNORMAL LOW (ref 3.87–5.11)
RDW: 13.8 % (ref 11.5–15.5)
WBC: 12.3 10*3/uL — AB (ref 4.0–10.5)

## 2014-06-02 MED ORDER — RHO D IMMUNE GLOBULIN 1500 UNIT/2ML IJ SOSY
300.0000 ug | PREFILLED_SYRINGE | Freq: Once | INTRAMUSCULAR | Status: AC
Start: 2014-06-02 — End: 2014-06-02
  Administered 2014-06-02: 300 ug via INTRAMUSCULAR
  Filled 2014-06-02: qty 2

## 2014-06-02 NOTE — Discharge Summary (Signed)
Obstetric Discharge Summary Reason for Admission: onset of labor Prenatal Procedures: none Intrapartum Procedures: spontaneous vaginal delivery with shoulder dystocia Postpartum Procedures: none Complications-Operative and Postpartum: shoulder dystocia HEMOGLOBIN  Date Value Ref Range Status  06/02/2014 10.9* 12.0 - 15.0 g/dL Final    Comment:    DELTA CHECK NOTED REPEATED TO VERIFY    HCT  Date Value Ref Range Status  06/02/2014 32.1* 36.0 - 46.0 % Final   Delivery Note G3P2 SVD at 39.4 At 1:23 PM a viable and healthy female was delivered via Vaginal, Spontaneous Delivery complicated by 60 second shoulder dystocia. Presentation: vertex; Position: Right,, Occiput,, Anterior  Delivery of the head: 1:22 PM First maneuver: 06/01/2014 1:23 PM, McRoberts Second maneuver: 06/01/2014 1:23 PM, Sherral Hammers screw (fetal shoulder rotation) Third maneuver: 06/01/2014 1:23 PM, Posterior arm delivery attempted Fourth maneuver: 06/01/2014 1:23 PM, McRoberts, Suprapubic pressure Fifth maneuver: 06/01/2014 1:23 PM, Posterior arm delivered  APGAR: 7, 9; weight 8 lb 14.2 oz (4031 g). Baby moving both arms well. Skin-to-skin with mom.  Placenta status: Intact, Manual removal.  Cord: 3 vessels with the following complications: None. Cord pH: NA  Anesthesia: Epidural  Episiotomy: None Lacerations: 1st degree perineal, hemostatic Suture Repair: None Est. Blood Loss (mL): 500  Mom to postpartum. Baby to Couplet care / Skin to Skin. Placenta to: BS Feeding: Breast Circ: NA Contraception: Undecided  Today: doing well, no complaints, took some percocets for pain, breast feeding, requests discharge today.  Still undecided about birth control  Physical Exam:  General: alert and cooperative Lochia: appropriate Uterine Fundus: firm Incision: healing well DVT Evaluation: No evidence of DVT seen on physical exam.  Discharge Diagnoses: Term Pregnancy-delivered and shoulder dystocia, 1st  degree laceration  Discharge Information: Date: 06/02/2014 Activity: pelvic rest Diet: routine Medications: None Condition: stable Instructions: refer to practice specific booklet Discharge to: home Follow-up Information    Follow up with [facility] In 5 weeks.   Specialty:  Obstetrics and Gynecology   Contact information:   [place], [place] [phone]      Newborn Data: Live born female  Birth Weight: 8 lb 14.2 oz (4031 g) APGAR: 7, 9  Home with mother.  Annette Cook 06/02/2014, 7:22 AM

## 2014-06-02 NOTE — Discharge Instructions (Signed)

## 2014-06-03 LAB — RH IG WORKUP (INCLUDES ABO/RH)
ABO/RH(D): A NEG
Fetal Screen: NEGATIVE
GESTATIONAL AGE(WKS): 39
Unit division: 0

## 2014-06-05 LAB — TYPE AND SCREEN
ABO/RH(D): A NEG
Antibody Screen: POSITIVE
DAT, IgG: NEGATIVE
UNIT DIVISION: 0
Unit division: 0

## 2014-06-13 NOTE — Telephone Encounter (Signed)
No note needs closed.

## 2014-06-22 ENCOUNTER — Telehealth: Payer: Self-pay | Admitting: *Deleted

## 2014-06-22 DIAGNOSIS — R12 Heartburn: Secondary | ICD-10-CM

## 2014-06-22 MED ORDER — COMPLETE NATAL DHA 29-1-200 & 250 MG PO MISC: ORAL | Status: DC

## 2014-06-22 MED ORDER — OMEPRAZOLE 20 MG PO CPDR: 20.0000 mg | DELAYED_RELEASE_CAPSULE | Freq: Every day | ORAL | Status: DC

## 2014-06-22 NOTE — Telephone Encounter (Signed)
RX sent for RF on PNV and Prilosec to [facility] order

## 2014-07-03 ENCOUNTER — Other Ambulatory Visit: Payer: Self-pay | Admitting: Advanced Practice Midwife

## 2014-07-05 ENCOUNTER — Other Ambulatory Visit: Payer: Self-pay | Admitting: Physician Assistant

## 2014-07-05 MED ORDER — PANTOPRAZOLE SODIUM 20 MG PO TBEC: 20.0000 mg | DELAYED_RELEASE_TABLET | Freq: Two times a day (BID) | ORAL | Status: DC

## 2014-07-05 MED ORDER — PANTOPRAZOLE SODIUM 20 MG PO TBEC
20.0000 mg | DELAYED_RELEASE_TABLET | Freq: Two times a day (BID) | ORAL | Status: DC
Start: 2014-07-05 — End: 2014-07-05

## 2014-07-05 NOTE — Telephone Encounter (Signed)
Original Rx refilled to [facility] on file, discontinued that Rx and sent to [facility] for mail order.

## 2014-07-14 ENCOUNTER — Ambulatory Visit (INDEPENDENT_AMBULATORY_CARE_PROVIDER_SITE_OTHER): Payer: Commercial Managed Care - PPO | Admitting: Physician Assistant

## 2014-07-14 ENCOUNTER — Ambulatory Visit (INDEPENDENT_AMBULATORY_CARE_PROVIDER_SITE_OTHER): Payer: Commercial Managed Care - PPO | Admitting: Family

## 2014-07-14 ENCOUNTER — Encounter: Payer: Self-pay | Admitting: Physician Assistant

## 2014-07-14 ENCOUNTER — Encounter: Payer: Self-pay | Admitting: Family

## 2014-07-14 VITALS — BP 109/62 | HR 79 | Ht 61.0 in | Wt 130.0 lb

## 2014-07-14 VITALS — BP 121/82 | HR 107 | Resp 16 | Ht 61.0 in | Wt 130.0 lb

## 2014-07-14 DIAGNOSIS — F430 Acute stress reaction: Secondary | ICD-10-CM | POA: Diagnosis not present

## 2014-07-14 DIAGNOSIS — G4700 Insomnia, unspecified: Secondary | ICD-10-CM

## 2014-07-14 DIAGNOSIS — Z1151 Encounter for screening for human papillomavirus (HPV): Secondary | ICD-10-CM | POA: Diagnosis not present

## 2014-07-14 DIAGNOSIS — B009 Herpesviral infection, unspecified: Secondary | ICD-10-CM

## 2014-07-14 DIAGNOSIS — Z124 Encounter for screening for malignant neoplasm of cervix: Secondary | ICD-10-CM

## 2014-07-14 DIAGNOSIS — N898 Other specified noninflammatory disorders of vagina: Secondary | ICD-10-CM

## 2014-07-14 MED ORDER — ZOLPIDEM TARTRATE 10 MG PO TABS: 10.0000 mg | ORAL_TABLET | Freq: Every evening | ORAL | Status: DC | PRN

## 2014-07-14 MED ORDER — CLONAZEPAM 1 MG PO TABS: 1.0000 mg | ORAL_TABLET | Freq: Two times a day (BID) | ORAL | Status: DC | PRN

## 2014-07-14 NOTE — Progress Notes (Signed)
Patient ID: Annette Cook, female   DOB: [birth year], 29 y.o.   MRN: [id number] Post Partum Exam  Annette Cook is a 29 y.o. female G3P3 who presents for a postpartum visit. She is 6 weeks postpartum following a spontaneous vaginal delivery. I have fully reviewed the prenatal and intrapartum course. +shoulder dystocia.  The delivery was at 39.4 gestational weeks. Outcome: spontaneous vaginal delivery. Anesthesia: epidural. Postpartum course has been no problems. Baby's course has been no problems. Baby is feeding by breast. Bleeding no bleeding. Bowel function is normal. Bladder function is normal. Patient is sexually active. Contraception method is none. Husband will get a vasectomy.  Understands pregnancy is possible until then.  Postpartum depression screening: negative.    Last pap smear 2014; reports abnormal pap smear in teens, +colpo > negative.    The following portions of the patient's history were reviewed and updated as appropriate: allergies, current medications, past family history, past medical history, past social history, past surgical history and problem list.  Review of Systems Pertinent items are noted in HPI.   Objective:   General:  alert, cooperative and appears stated age   Breasts:  inspection negative, no nipple discharge or bleeding, no masses or nodularity palpable  Lungs: clear to auscultation bilaterally  Heart:  regular rate and rhythm, S1, S2 normal, no murmur, click, rub or gallop  Abdomen: soft, non-tender; bowel sounds normal; no masses,  no organomegaly   Vulva:  normal  Vagina: normal vagina, no discharge, exudate, lesion, or erythema; healed well  Cervix:  no cervical motion tenderness  Corpus: normal size, contour, position, consistency, mobility, non-tender  Adnexa:  normal adnexa  Rectal Exam: Not performed.         Assessment:    Normal postpartum exam. Pap smear done at today's visit.   Plan:    1. Contraception: none 2. Pap smear sent to  lab. 3. Follow up in: 1 year or as needed.   Venia Carbon Michiel Cowboy, CNM

## 2014-07-14 NOTE — Progress Notes (Signed)
   Subjective:    Patient ID: Annette Cook, female    DOB: 1986/01/20, 29 y.o.   MRN: [id number]  HPI Pt presents to the clinic for medication refills.   Insomnia- rarely using ambien but medications are free right now and would like to go ahead and get them.   Anxiety- she also not using klonapin often at all. Maybe once or twice a week. She just wants refill while medications are free from meeting deductible.   No problems or concerns.    Review of Systems  All other systems reviewed and are negative.      Objective:   Physical Exam  Constitutional: She is oriented to person, place, and time. She appears well-developed and well-nourished.  HENT:  Head: Normocephalic and atraumatic.  Cardiovascular: Normal rate, regular rhythm and normal heart sounds.   Pulmonary/Chest: Effort normal and breath sounds normal.  Neurological: She is alert and oriented to person, place, and time.  Skin: Skin is dry.  Psychiatric: She has a normal mood and affect. Her behavior is normal.          Assessment & Plan:  Insomnia- sent 90 day rx for 6 months.   Anxiety- refilled klonapin for 6 months sent 90 day rx.

## 2014-07-15 LAB — WET PREP FOR TRICH, YEAST, CLUE
CLUE CELLS WET PREP: NONE SEEN
Trich, Wet Prep: NONE SEEN
WBC WET PREP: NONE SEEN
Yeast Wet Prep HPF POC: NONE SEEN

## 2014-07-17 LAB — CYTOLOGY - PAP

## 2014-07-25 ENCOUNTER — Other Ambulatory Visit: Payer: Self-pay | Admitting: Physician Assistant

## 2014-07-28 ENCOUNTER — Other Ambulatory Visit: Payer: Self-pay | Admitting: *Deleted

## 2014-07-28 MED ORDER — CYCLOBENZAPRINE HCL 10 MG PO TABS
ORAL_TABLET | ORAL | Status: DC
Start: 2014-07-28 — End: 2014-09-13

## 2014-07-28 MED ORDER — PANTOPRAZOLE SODIUM 20 MG PO TBEC: 20.0000 mg | DELAYED_RELEASE_TABLET | Freq: Two times a day (BID) | ORAL | Status: DC

## 2014-07-28 MED ORDER — HYDROCODONE-HOMATROPINE 5-1.5 MG/5ML PO SYRP: 5.0000 mL | ORAL_SOLUTION | Freq: Every evening | ORAL | Status: DC | PRN

## 2014-09-13 ENCOUNTER — Other Ambulatory Visit: Payer: Self-pay | Admitting: *Deleted

## 2014-09-13 ENCOUNTER — Other Ambulatory Visit: Payer: Self-pay | Admitting: Physician Assistant

## 2014-09-13 MED ORDER — CYCLOBENZAPRINE HCL 10 MG PO TABS: ORAL_TABLET | ORAL | Status: DC

## 2014-09-28 ENCOUNTER — Other Ambulatory Visit: Payer: Self-pay | Admitting: *Deleted

## 2014-09-28 DIAGNOSIS — B009 Herpesviral infection, unspecified: Secondary | ICD-10-CM

## 2014-09-28 MED ORDER — VALACYCLOVIR HCL 500 MG PO TABS: 500.0000 mg | ORAL_TABLET | Freq: Every day | ORAL | Status: DC

## 2014-10-30 ENCOUNTER — Other Ambulatory Visit: Payer: Self-pay | Admitting: Sports Medicine

## 2014-10-30 ENCOUNTER — Other Ambulatory Visit: Payer: Self-pay | Admitting: Physician Assistant

## 2014-10-30 MED ORDER — ZOLPIDEM TARTRATE 10 MG PO TABS: 10.0000 mg | ORAL_TABLET | Freq: Every evening | ORAL | Status: DC | PRN

## 2014-10-30 MED ORDER — CLONAZEPAM 1 MG PO TABS: 1.0000 mg | ORAL_TABLET | Freq: Two times a day (BID) | ORAL | Status: DC | PRN

## 2014-10-30 NOTE — Telephone Encounter (Signed)
Received Rx request from [facility] as a proactive refill request. Pt just had a 90 day supply shipped out last week, but has no further refills on file. Will route to Provider for review to see if refill now is appropriate.

## 2014-12-06 ENCOUNTER — Ambulatory Visit (INDEPENDENT_AMBULATORY_CARE_PROVIDER_SITE_OTHER): Payer: Commercial Managed Care - PPO | Admitting: Physician Assistant

## 2014-12-06 DIAGNOSIS — G5602 Carpal tunnel syndrome, left upper limb: Secondary | ICD-10-CM

## 2014-12-06 DIAGNOSIS — G5601 Carpal tunnel syndrome, right upper limb: Secondary | ICD-10-CM

## 2014-12-06 DIAGNOSIS — M25531 Pain in right wrist: Secondary | ICD-10-CM

## 2014-12-06 DIAGNOSIS — Z23 Encounter for immunization: Secondary | ICD-10-CM

## 2014-12-06 DIAGNOSIS — M25532 Pain in left wrist: Secondary | ICD-10-CM

## 2014-12-06 DIAGNOSIS — G5603 Carpal tunnel syndrome, bilateral upper limbs: Secondary | ICD-10-CM

## 2014-12-07 NOTE — Progress Notes (Signed)
   Subjective:    Patient ID: Annette Cook, female    DOB: 09/28/1985, 29 y.o.   MRN: [id number]  HPI Pt presents to the clinic with bilateral wrist pain. This has been going on for 2 months. Describes pain as numbness, tingling and sharp pain through both wrist extending into fingers. More into thumb and pinky. Ibuprofen 800mg  and tramadol as needed helps some. She wears wrist braces every night which has helped some. Pain worse at night but happens during the day as well.    Review of Systems  All other systems reviewed and are negative.      Objective:   Physical Exam  Constitutional: She is oriented to person, place, and time. She appears well-developed and well-nourished.  Musculoskeletal:  Positive phalens and tinels bilaterally.  Negative finklestein bilaterally.  ROM of both wrist normal.  Strength of hand grip 3+.  No visible swelling.   Neurological: She is alert and oriented to person, place, and time.  Skin: Skin is dry.  Psychiatric: She has a normal mood and affect. Her behavior is normal.          Assessment & Plan:  Bilateral wrist pain/carpal tunnel- sounds like classic carpal tunnel and with 3 relatively young children she has risk factors. Given mobic to start with tramadol with more intense pain. She already has braces for bedtime. Will get EMG to see if surgery is an option.   Flu shot given today.

## 2014-12-19 ENCOUNTER — Other Ambulatory Visit: Payer: Self-pay | Admitting: Sports Medicine

## 2014-12-26 ENCOUNTER — Other Ambulatory Visit: Payer: Self-pay | Admitting: Physician Assistant

## 2014-12-27 ENCOUNTER — Other Ambulatory Visit: Payer: Self-pay | Admitting: *Deleted

## 2014-12-27 DIAGNOSIS — M25531 Pain in right wrist: Secondary | ICD-10-CM

## 2014-12-27 DIAGNOSIS — G5603 Carpal tunnel syndrome, bilateral upper limbs: Secondary | ICD-10-CM

## 2014-12-27 DIAGNOSIS — M25532 Pain in left wrist: Secondary | ICD-10-CM

## 2015-01-16 ENCOUNTER — Telehealth: Payer: Self-pay | Admitting: Physician Assistant

## 2015-01-16 DIAGNOSIS — R200 Anesthesia of skin: Secondary | ICD-10-CM | POA: Insufficient documentation

## 2015-01-16 NOTE — Telephone Encounter (Signed)
Please call pt with results: no evidence of median, ulnar, cervical, brachial or peripherial neuropathy. Sounds like ulnar neuritis this could be transient or off and on with overuse. Injections, sling, rest, NSAIDs are all appropriate treatment. How are symptoms today to discuss what we should try first?

## 2015-01-16 NOTE — Telephone Encounter (Signed)
Pt notified of results.  She is going to look and see how much meloxicam she has left & let me know if she needs a refill.

## 2015-01-23 ENCOUNTER — Other Ambulatory Visit: Payer: Self-pay | Admitting: Physician Assistant

## 2015-01-23 MED ORDER — ZOLPIDEM TARTRATE 10 MG PO TABS: 10.0000 mg | ORAL_TABLET | Freq: Every evening | ORAL | Status: DC | PRN

## 2015-01-23 MED ORDER — CLONAZEPAM 1 MG PO TABS: 1.0000 mg | ORAL_TABLET | Freq: Two times a day (BID) | ORAL | Status: DC | PRN

## 2015-02-11 ENCOUNTER — Other Ambulatory Visit: Payer: Self-pay | Admitting: Physician Assistant

## 2015-02-14 ENCOUNTER — Other Ambulatory Visit: Payer: Self-pay | Admitting: Sports Medicine

## 2015-02-21 ENCOUNTER — Telehealth: Payer: Self-pay | Admitting: *Deleted

## 2015-02-21 ENCOUNTER — Encounter: Payer: Self-pay | Admitting: Physician Assistant

## 2015-02-21 ENCOUNTER — Other Ambulatory Visit: Payer: Self-pay | Admitting: *Deleted

## 2015-02-21 DIAGNOSIS — R059 Cough, unspecified: Secondary | ICD-10-CM | POA: Insufficient documentation

## 2015-02-21 DIAGNOSIS — R05 Cough: Secondary | ICD-10-CM | POA: Insufficient documentation

## 2015-02-21 MED ORDER — HYDROCODONE-HOMATROPINE 5-1.5 MG/5ML PO SYRP
5.0000 mL | ORAL_SOLUTION | Freq: Every evening | ORAL | Status: DC | PRN
Start: 2015-02-21 — End: 2016-07-17

## 2015-02-21 NOTE — Telephone Encounter (Signed)
Ok I documented an occasional cough. Will have to pick up rx not faxable. Hycodan 5ML at bedtime as needed for cough 185mL total. NRF

## 2015-02-21 NOTE — Telephone Encounter (Signed)
Pt called asking if you would be willing to refill the hycodan for her before the end of the year since she's met her deductible.  She stated that she only uses it once in a blue moon & just likes to have it on hand in case she ever needs it.  Please advise.

## 2015-02-21 NOTE — Telephone Encounter (Signed)
Pt notified to pick up rx.

## 2015-02-27 ENCOUNTER — Encounter: Payer: Self-pay | Admitting: Family Medicine

## 2015-02-27 ENCOUNTER — Ambulatory Visit (INDEPENDENT_AMBULATORY_CARE_PROVIDER_SITE_OTHER): Payer: Commercial Managed Care - PPO | Admitting: Family Medicine

## 2015-02-27 ENCOUNTER — Ambulatory Visit (INDEPENDENT_AMBULATORY_CARE_PROVIDER_SITE_OTHER): Payer: Commercial Managed Care - PPO

## 2015-02-27 VITALS — BP 132/74 | HR 87 | Wt 145.0 lb

## 2015-02-27 DIAGNOSIS — M5412 Radiculopathy, cervical region: Secondary | ICD-10-CM | POA: Diagnosis not present

## 2015-02-27 DIAGNOSIS — M542 Cervicalgia: Secondary | ICD-10-CM | POA: Diagnosis not present

## 2015-02-27 DIAGNOSIS — R200 Anesthesia of skin: Secondary | ICD-10-CM

## 2015-02-27 DIAGNOSIS — R208 Other disturbances of skin sensation: Secondary | ICD-10-CM

## 2015-02-27 MED ORDER — PREDNISONE 10 MG (48) PO TBPK: ORAL_TABLET | Freq: Every day | ORAL | Status: DC

## 2015-02-27 NOTE — Patient Instructions (Signed)
Thank you for coming in today. Come back or go to the emergency room if you notice new weakness new numbness problems walking or bowel or bladder problems. Follow up after MRI.   Cervical Radiculopathy Cervical radiculopathy happens when a nerve in the neck (cervical nerve) is pinched or bruised. This condition can develop because of an injury or as part of the normal aging process. Pressure on the cervical nerves can cause pain or numbness that runs from the neck all the way down into the arm and fingers. Usually, this condition gets better with rest. Treatment may be needed if the condition does not improve.  CAUSES This condition may be caused by:  Injury.  Slipped (herniated) disk.  Muscle tightness in the neck because of overuse.  Arthritis.  Breakdown or degeneration in the bones and joints of the spine (spondylosis) due to aging.  Bone spurs that may develop near the cervical nerves. SYMPTOMS Symptoms of this condition include:  Pain that runs from the neck to the arm and hand. The pain can be severe or irritating. It may be worse when the neck is moved.  Numbness or weakness in the affected arm and hand. DIAGNOSIS This condition may be diagnosed based on symptoms, medical history, and a physical exam. You may also have tests, including:  X-rays.  CT scan.  MRI.  Electromyogram (EMG).  Nerve conduction tests. TREATMENT In many cases, treatment is not needed for this condition. With rest, the condition usually gets better over time. If treatment is needed, options may include:  Wearing a soft neck collar for short periods of time.  Physical therapy to strengthen your neck muscles.  Medicines, such as NSAIDs, oral corticosteroids, or spinal injections.  Surgery. This may be needed if other treatments do not help. Various types of surgery may be done depending on the cause of your problems. HOME CARE INSTRUCTIONS Managing Pain  Take over-the-counter and  prescription medicines only as told by your health care provider.  If directed, apply ice to the affected area.  Put ice in a plastic bag.  Place a towel between your skin and the bag.  Leave the ice on for 20 minutes, 2-3 times per day.  If ice does not help, you can try using heat. Take a warm shower or warm bath, or use a heat pack as told by your health care provider.  Try a gentle neck and shoulder massage to help relieve symptoms. Activity  Rest as needed. Follow instructions from your health care provider about any restrictions on activities.  Do stretching and strengthening exercises as told by your health care provider or physical therapist. General Instructions  If you were given a soft collar, wear it as told by your health care provider.  Use a flat pillow when you sleep.  Keep all follow-up visits as told by your health care provider. This is important. SEEK MEDICAL CARE IF:  Your condition does not improve with treatment. SEEK IMMEDIATE MEDICAL CARE IF:  Your pain gets much worse and cannot be controlled with medicines.  You have weakness or numbness in your hand, arm, face, or leg.  You have a high fever.  You have a stiff, rigid neck.  You lose control of your bowels or your bladder (have incontinence).  You have trouble with walking, balance, or speaking.   This information is not intended to replace advice given to you by your health care provider. Make sure you discuss any questions you have with your health  care provider.   Document Released: 12/24/2000 Document Revised: 12/20/2014 Document Reviewed: 05/25/2014 Elsevier Interactive Patient Education [employer].

## 2015-02-27 NOTE — Assessment & Plan Note (Addendum)
Patient symptoms are somewhat of a conundrum at this time.  She has multiple different possibilities for nerve impingement as the cause of her symptoms. I think cervical impingement at this time is most likely. The nerve conduction study was negative in September however her symptoms have worsened. This test may be a false negative. At this time as she has had symptoms now for months and has failed home therapy and rest would recommend x-ray and MRI of her cervical spine. Additionally we'll treat with oral prednisone Dosepak empirically. Discussed safety of prednisone Dosepak with breast-feeding an 60-month-old. I feel is reasonable as to his mother. Return following MRI.

## 2015-02-27 NOTE — Progress Notes (Signed)
   Subjective:    I'm seeing this patient as a consultation for:  Iran Planas PA-C   CC: Wrist and Arm pain.   HPI: Patient has had about 5 months of bilateral hand and wrist and arm and neck pain. She denies any specific injury. She Had an initial evaluation by her PCP in August 2 suspect carpal tunnel syndrome. She was referred for nerve conduction study that in September was negative and normal. Since then her symptoms have waxed and waned but mostly worsened. She notes pain and tingling sensation in the ulnar side of her hands and wrists especially at the fourth and fifth digits radiating through her medial forearm and into the shoulders and neck. She denies any significant weakness or difficulty walking. No bowel bladder dysfunction. She notes that carrying her children tends to worsen her symptoms. Sometimes she wakes up from sleep with symptoms in all 5 fingers of her hands bilaterally. She has not had any specific treatment for this problem yet. She is currently breast-feeding an 32-month-old infant. She is a total of 3 children all under the age of 7.  Past medical history, Surgical history, Family history not pertinant except as noted below, Social history, Allergies, and medications have been entered into the medical record, reviewed, and no changes needed.   Review of Systems: No headache, visual changes, nausea, vomiting, diarrhea, constipation, dizziness, abdominal pain, skin rash, fevers, chills, night sweats, weight loss, swollen lymph nodes, body aches, joint swelling, muscle aches, chest pain, shortness of breath, mood changes, visual or auditory hallucinations.   Objective:    Filed Vitals:   02/27/15 1057  BP: 132/74  Pulse: 87   General: Well Developed, well nourished, and in no acute distress.  Neuro/Psych: Alert and oriented x3, extra-ocular muscles intact, able to move all 4 extremities, sensation grossly intact. Skin: Warm and dry, no rashes noted.  Respiratory: Not  using accessory muscles, speaking in full sentences, trachea midline.  Cardiovascular: Pulses palpable, no extremity edema. Abdomen: Does not appear distended. MSK: Neck is nontender with normal range of motion. She has a positive Spurling's test bilaterally with radicular symptoms to the ulnar hands bilaterally.  Upper extremity strength and sensation are intact throughout.  She is a positive Tinel's test at bilateral cubital tunnel. With range of motion of her elbow she had a palpable pop at the cubital tunnel with radicular symptoms. Otherwise her elbow exam bilaterally is nonremarkable and nontender with normal motion and strength Her wrist exam is normal appearing with no tenderness. She has a positive Tinel's overlying the carpal tunnel and Guyon's canal bilaterally. Positive Phalen's test bilaterally. Negative Finkelstein's test bilaterally. Capillary refill sensation pulses and grip strength are intact bilaterally.   Nerve conduction study dated 01/04/2015 reviewed.  No results found for this or any previous visit (from the past 24 hour(s)). No results found.  Impression and Recommendations:   This case required medical decision making of moderate complexity.

## 2015-02-28 NOTE — Progress Notes (Signed)
Quick Note:  Xray normal. ______ 

## 2015-03-03 ENCOUNTER — Ambulatory Visit (HOSPITAL_BASED_OUTPATIENT_CLINIC_OR_DEPARTMENT_OTHER)
Admission: RE | Admit: 2015-03-03 | Discharge: 2015-03-03 | Disposition: A | Discharge status: Home / Self Care | Payer: Commercial Managed Care - PPO | Source: Ambulatory Visit | Attending: Family Medicine | Admitting: Family Medicine

## 2015-03-03 DIAGNOSIS — R208 Other disturbances of skin sensation: Secondary | ICD-10-CM | POA: Diagnosis not present

## 2015-03-03 DIAGNOSIS — M5412 Radiculopathy, cervical region: Secondary | ICD-10-CM | POA: Diagnosis present

## 2015-03-03 DIAGNOSIS — R200 Anesthesia of skin: Secondary | ICD-10-CM

## 2015-03-05 NOTE — Progress Notes (Signed)
Quick Note:  MRI cspine was completely normal. Your problem is probably in the shoulders, elbows or wrists. Return following prednisone dosepack for discussion of what to do next. I am thinking about injections. ______

## 2015-03-15 ENCOUNTER — Ambulatory Visit (INDEPENDENT_AMBULATORY_CARE_PROVIDER_SITE_OTHER): Payer: Commercial Managed Care - PPO | Admitting: Family Medicine

## 2015-03-15 ENCOUNTER — Encounter: Payer: Self-pay | Admitting: Family Medicine

## 2015-03-15 VITALS — BP 121/71 | HR 100 | Wt 150.0 lb

## 2015-03-15 DIAGNOSIS — F411 Generalized anxiety disorder: Secondary | ICD-10-CM | POA: Diagnosis not present

## 2015-03-15 DIAGNOSIS — R200 Anesthesia of skin: Secondary | ICD-10-CM

## 2015-03-15 DIAGNOSIS — H60399 Other infective otitis externa, unspecified ear: Secondary | ICD-10-CM | POA: Insufficient documentation

## 2015-03-15 DIAGNOSIS — G5623 Lesion of ulnar nerve, bilateral upper limbs: Secondary | ICD-10-CM | POA: Diagnosis not present

## 2015-03-15 DIAGNOSIS — H60391 Other infective otitis externa, right ear: Secondary | ICD-10-CM

## 2015-03-15 DIAGNOSIS — F430 Acute stress reaction: Secondary | ICD-10-CM | POA: Diagnosis not present

## 2015-03-15 DIAGNOSIS — R208 Other disturbances of skin sensation: Secondary | ICD-10-CM

## 2015-03-15 MED ORDER — NEOMYCIN-POLYMYXIN-HC 3.5-10000-1 OT SOLN: 4.0000 [drp] | Freq: Four times a day (QID) | OTIC | Status: DC

## 2015-03-15 MED ORDER — CLONAZEPAM 1 MG PO TABS: 1.0000 mg | ORAL_TABLET | Freq: Two times a day (BID) | ORAL | Status: DC | PRN

## 2015-03-15 NOTE — Progress Notes (Signed)
Annette Cook is a 29 y.o. female who presents to [facility]  today for follow-up hand paresthesias. She has been seen for on her hand paresthesias recently. At this point the diagnosis was unclear as she had a normal nerve conduction study. At the last visit a MRI of her cervical spine was obtained which was normal. She was also given prednisone which worked quite well however it returned when the prednisone ended. She has symptoms are worse sleep. She is unable to tolerate night splints because she has to breast-feed her child. Symptoms are bothersome. No weakness this point.   Additionally patient notes worsening anxiety recently. She had a pipe burst at home. She's been using her clonazepam a little more than she typically does for anxiety and would like permission to use twice daily if possible.  Additionally patient has irritation in her right external ear. This is been present for few days. She thinks these are pimples.  Past Medical History  Diagnosis Date  . ADHD (attention deficit hyperactivity disorder)   . Anxiety   . Insomnia   . GERD (gastroesophageal reflux disease)    Past Surgical History  Procedure Laterality Date  . Appendectomy    . Wisdom tooth extraction    . Tonsillectomy and adenoidectomy     Social History  Substance Use Topics  . Smoking status: Former Smoker -- 1.00 packs/day for 7 years    Types: Cigarettes    Quit date: 10/22/2004  . Smokeless tobacco: Never Used  . Alcohol Use: Yes     Comment: socially   family history includes Hypertension in her mother; Stroke in her mother.  ROS as above Medications: Current Outpatient Prescriptions  Medication Sig Dispense Refill  . clonazePAM (KLONOPIN) 1 MG tablet Take 1 tablet (1 mg total) by mouth 2 (two) times daily as needed for anxiety. For acute stress reaction 14 tablet 0  . cyclobenzaprine (FLEXERIL) 10 MG tablet TAKE 1/2 TO 1 TABLET BY  MOUTH EVERY 8 HOURS AS   NEEDED FOR MUSCLE SPASM 40 tablet 11  . HYDROcodone-homatropine (HYCODAN) 5-1.5 MG/5ML syrup Take 5 mLs by mouth at bedtime as needed for cough. 180 mL 0  . meloxicam (MOBIC) 15 MG tablet TAKE 1 TABLET EVERY DAY 30 tablet 1  . pantoprazole (PROTONIX) 20 MG tablet Take 1 tablet by mouth two  times daily 180 tablet 1  . Prenat-FeBis-FePro-FA-CA-Omega (COMPLETE NATAL DHA) 29-1-200 & 250 MG MISC Take one tablet daily. 90 each 4  . traMADol (ULTRAM) 50 MG tablet Take by mouth every 6 (six) hours as needed.    . valACYclovir (VALTREX) 500 MG tablet Take 1 tablet (500 mg total) by mouth at bedtime. 90 tablet 3  . zolpidem (AMBIEN) 10 MG tablet Take 1 tablet (10 mg total) by mouth at bedtime as needed for sleep. Due for follow up appointment. 90 tablet 0  . neomycin-polymyxin-hydrocortisone (CORTISPORIN) otic solution Place 4 drops into the right ear 4 (four) times daily. 10 mL 0   No current facility-administered medications for this visit.   No Known Allergies   Exam:  BP 121/71 mmHg  Pulse 100  Wt 150 lb (68.04 kg) Gen: Well NAD MSK: Hands are normal appearing bilaterally with no hyperthenar atrophy. Elbows nontender. Positive Tinel's bilateral cubital tunnel. Nontender and no Tinel's at Guyon's canal l bilaterally.\ Right ear: Erythema and tenderness external canal. Normal tympanic membranes. Small papules present on the externally ear canal. No vesicles visible. Left ear is  normal-appearing Psych: Alert and oriented normal speech thought process. No SI or HI.  Limited musculoskeletal ultrasound bilateral elbows: Cubital tunnel visualized bilaterally. Right: Enlarged ulnar nerve measuring 15 mm. Tender to palpation with reproduction of paresthesias overlying the ulnar nerve. Dynamic ultrasound obtained showing subluxation of the ulnar nerve almost completely out of the cubital tunnel. Symptoms reproducible with dynamic ultrasound. No bony abnormalities.  Left: Enlarged ulnar nerve at 15  mm. No other significant abnormalities noted. Dynamic ultrasound was not obtained on the left side.  No results found for this or any previous visit (from the past 24 hour(s)). No results found.   Please see individual assessment and plan sections.

## 2015-03-15 NOTE — Assessment & Plan Note (Signed)
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 Discussed options. Plan for ultrasound-guided hydrodissection in the near future.

## 2015-03-15 NOTE — Assessment & Plan Note (Signed)
Limited amount of Klonopin twice daily. 14 pills prescribed. Follow-up with PCP for this issue in the near future. She may benefit from SSRI.

## 2015-03-15 NOTE — Assessment & Plan Note (Signed)
Treat with Cortisporin drops

## 2015-03-15 NOTE — Patient Instructions (Signed)
Thank you for coming in today. Call or go to the emergency room if you get worse, have trouble breathing, have chest pains, or palpitations.  I will ask my colleagues about the best course of treatment for your ulnar nerve subluxation. Take Klonopin twice daily for anxiety for one week as needed. Use eardrops as needed  Cubital Tunnel Syndrome Cubital tunnel syndrome is a condition that causes pain and weakness of the forearm and hand. This condition happens when one of the nerves (ulnar nerve) that runs alongside the elbow joint becomes irritated. CAUSES Causes of this condition include:  Increased pressure on the ulnar nerve at the elbow, arm, or forearm. This can be caused by:  Swollen tissues.  Ligaments.  Muscles.  Poorly healed elbow fractures.  Tumors in the elbow. These are usually noncancerous (benign).  Scar tissue that develops in the elbow after an injury.  Bony growths (spurs) near the ulnar nerve.  Stretching of the nerve due to loose elbow ligaments.  Trauma to the nerve at the elbow.  Repetitive elbow bending.  Certain medical conditions. RISK FACTORS: This condition is more likely to develop in:  People who do manual labor that requires frequently bending the elbow.  People who play sports that include repeated or strenuous throwing motions, such as baseball.  People who play contact sports, such as football or lacrosse.  People who do not warm up properly before activities.  People who have diabetes.  People who have an underactive thyroid (hypothyroidism). SYMPTOMS Symptoms of this condition include:  Clumsiness or weakness of the hand.  Tenderness of the inner elbow.  Aching or soreness of the inner elbow, forearm, or fingers, especially the little finger or the ring finger.  Increased pain with forced elbow bending.  Reduced control when throwing.  Tingling, numbness, or burning inside the forearm, or in part of the hand or fingers,  especially the little finger or the ring finger.  Sharp pains that shoot from the elbow down to the wrist and hand.  The inability to grip or pinch hard. DIAGNOSIS This condition is diagnosed with a medical history and physical exam. Your health care provider will ask about your symptoms and ask for details about any injury. You may also have other tests, including:  Electromyogram (EMG). This test checks how well the nerve is working.  X-ray. TREATMENT Treatment starts by stopping the activities that are causing your symptoms to get worse. Treatment may include the use of icing and medicines to reduce pain and swelling. You may also be advised to wear a splint to prevent your elbow from bending or wear an elbow pad where the ulnar nerve is closest to the skin. In less severe cases, treatment may also include working with a physical therapist:  To help decrease your symptoms.  To improve the strength and range of motion of your elbow, forearm, and hand. If the treatments described above do not help, surgery may be needed. HOME CARE INSTRUCTIONS If You Have a Splint:  Wear it as told by your health care provider. Remove it only as told by your health care provider.  Loosen the splint if your fingers become numb and tingle, or if they turn cold and blue.  Keep the splint clean and dry. Managing Pain, Stiffness, and Swelling  If directed, apply ice to the injured area:  Put ice in a plastic bag.  Place a towel between your skin and the bag.  Leave the ice on for 20 minutes, 2-3  times per day.  Move your fingers often to avoid stiffness and to lessen swelling.  Raise (elevate) the injured area above the level of your heart while you are sitting or lying down. General Instructions  Take over-the-counter and prescription medicines only as told by your health care provider.  Keep all follow-up visits as told by your health care provider. This is important.  Do any exercise or  physical therapy as told by your health care provider.  Do not drive or operate heavy machinery while taking prescription pain medicine.  If you were given an elbow pad, wear it as told by your health care provider. SEEK MEDICAL CARE IF:  Your symptoms get worse.  Your symptoms do not get better with treatment.  Your have new pain.  Your hand on the injured side feels numb or cold.   This information is not intended to replace advice given to you by your health care provider. Make sure you discuss any questions you have with your health care provider.   Document Released: 03/31/2005 Document Revised: 12/20/2014 Document Reviewed: 06/07/2014 Elsevier Interactive Patient Education [employer].

## 2015-03-16 ENCOUNTER — Encounter: Payer: Self-pay | Admitting: Family Medicine

## 2015-03-16 ENCOUNTER — Ambulatory Visit (INDEPENDENT_AMBULATORY_CARE_PROVIDER_SITE_OTHER): Payer: Commercial Managed Care - PPO | Admitting: Family Medicine

## 2015-03-16 VITALS — BP 116/80 | HR 103 | Wt 148.0 lb

## 2015-03-16 DIAGNOSIS — G5623 Lesion of ulnar nerve, bilateral upper limbs: Secondary | ICD-10-CM

## 2015-03-16 MED ORDER — CLONAZEPAM 1 MG PO TABS: 1.0000 mg | ORAL_TABLET | Freq: Three times a day (TID) | ORAL | Status: DC | PRN

## 2015-03-16 NOTE — Progress Notes (Signed)
Patient returns for her previously scheduled right cubital tunnel ulnar nerve hydrodissection.  Procedure: Real-time Ultrasound Guided hydrodissection of the right ulnar nerve at the right cubital tunnel  Device: GE Logiq E  Images permanently stored and available for review in the ultrasound unit. Verbal informed consent obtained. Discussed risks and benefits of procedure. Warned about infection bleeding damage to structures skin hypopigmentation and fat atrophy among others. Patient expresses understanding and agreement Time-out conducted.  Noted no overlying erythema, induration, or other signs of local infection.  Skin prepped in a sterile fashion.  Local anesthesia: Topical Ethyl chloride.  With sterile technique and under real time ultrasound guidance a 25-gauge 1.5 inch needle was placed superficial to the ulnar nerve in the cubital tunnel and short axis. A small amount of fluid was injected seen distended tissue. The needle was examined redirected superficially lateral and medial ulnar nerve on while the solution of lidocaine and Kenalog was being instilled into the tissue. Care was taken to avoid vascular and nerve structures. Patient tolerated the procedure well   Advised to call if fevers/chills, erythema, induration, drainage, or persistent bleeding.  Images permanently stored and available for review in the ultrasound unit.  Impression: Technically successful ultrasound guided injection.     Of note:  Patient was prescribed Klonopin at the last visit however they use the exact same dosing instructions and amount as she receives chronically. I wrote a new prescription for slightly different dosing instructions so the pharmacy can dispense it. She returned the old prescription.

## 2015-03-16 NOTE — Assessment & Plan Note (Signed)
Status post injection right. Return for left injection. Recommend night splints.

## 2015-03-16 NOTE — Patient Instructions (Signed)
Thank you for coming in today. Call or go to the ER if you develop a large red swollen joint with extreme pain or oozing puss.  Try the new rx for Klonopin.  Use a elbow pad to resist elbow flexion at night.  Return in a week or 2 for left elbow injection if the right seems to work.

## 2015-03-27 ENCOUNTER — Ambulatory Visit (INDEPENDENT_AMBULATORY_CARE_PROVIDER_SITE_OTHER): Payer: Commercial Managed Care - PPO | Admitting: Family Medicine

## 2015-03-27 ENCOUNTER — Encounter: Payer: Self-pay | Admitting: Family Medicine

## 2015-03-27 ENCOUNTER — Other Ambulatory Visit: Payer: Self-pay | Admitting: *Deleted

## 2015-03-27 VITALS — BP 121/73 | HR 99 | Wt 150.0 lb

## 2015-03-27 DIAGNOSIS — Z72820 Sleep deprivation: Secondary | ICD-10-CM

## 2015-03-27 DIAGNOSIS — R208 Other disturbances of skin sensation: Secondary | ICD-10-CM

## 2015-03-27 DIAGNOSIS — R200 Anesthesia of skin: Secondary | ICD-10-CM

## 2015-03-27 DIAGNOSIS — G5623 Lesion of ulnar nerve, bilateral upper limbs: Secondary | ICD-10-CM

## 2015-03-27 MED ORDER — HYDROXYZINE PAMOATE 50 MG PO CAPS: 50.0000 mg | ORAL_CAPSULE | Freq: Three times a day (TID) | ORAL | Status: AC | PRN

## 2015-03-27 NOTE — Patient Instructions (Signed)
Thank you for coming in today. Call or go to the ER if you develop a large red swollen joint with extreme pain or oozing puss.  Take it easy.  Use the brace.  Return in about 1 month or so for follow up.

## 2015-03-27 NOTE — Assessment & Plan Note (Signed)
Left cubital tunnel injection. Use elbow brace and follow-up in one month. Discussed warning signs or symptoms.

## 2015-03-27 NOTE — Progress Notes (Signed)
Annette Cook is a 29 y.o. female who presents to [facility] today for left hand paresthesias. Patient was recently seen for bilateral ulnar paresthesias thought to be related to cubital tunnel syndrome. She had a right cubital tunnel injection on December 2 and is doing very well and her pain is improved. She is here today to discuss injection for her left elbow. She feels well with no fevers or chills.   Past Medical History  Diagnosis Date  . ADHD (attention deficit hyperactivity disorder)   . Anxiety   . Insomnia   . GERD (gastroesophageal reflux disease)    Past Surgical History  Procedure Laterality Date  . Appendectomy    . Wisdom tooth extraction    . Tonsillectomy and adenoidectomy     Social History  Substance Use Topics  . Smoking status: Former Smoker -- 1.00 packs/day for 7 years    Types: Cigarettes    Quit date: 10/22/2004  . Smokeless tobacco: Never Used  . Alcohol Use: Yes     Comment: socially   family history includes Hypertension in her mother; Stroke in her mother.  ROS as above Medications: Current Outpatient Prescriptions  Medication Sig Dispense Refill  . clonazePAM (KLONOPIN) 1 MG tablet Take 1 tablet (1 mg total) by mouth 3 (three) times daily as needed for anxiety. For acute stress reaction 21 tablet 0  . cyclobenzaprine (FLEXERIL) 10 MG tablet TAKE 1/2 TO 1 TABLET BY  MOUTH EVERY 8 HOURS AS  NEEDED FOR MUSCLE SPASM 40 tablet 11  . HYDROcodone-homatropine (HYCODAN) 5-1.5 MG/5ML syrup Take 5 mLs by mouth at bedtime as needed for cough. 180 mL 0  . meloxicam (MOBIC) 15 MG tablet TAKE 1 TABLET EVERY DAY 30 tablet 1  . neomycin-polymyxin-hydrocortisone (CORTISPORIN) otic solution Place 4 drops into the right ear 4 (four) times daily. 10 mL 0  . pantoprazole (PROTONIX) 20 MG tablet Take 1 tablet by mouth two  times daily 180 tablet 1  . Prenat-FeBis-FePro-FA-CA-Omega (COMPLETE NATAL DHA) 29-1-200 & 250 MG MISC Take one  tablet daily. 90 each 4  . traMADol (ULTRAM) 50 MG tablet Take by mouth every 6 (six) hours as needed.    . valACYclovir (VALTREX) 500 MG tablet Take 1 tablet (500 mg total) by mouth at bedtime. 90 tablet 3  . zolpidem (AMBIEN) 10 MG tablet Take 1 tablet (10 mg total) by mouth at bedtime as needed for sleep. Due for follow up appointment. 90 tablet 0  . hydrOXYzine (VISTARIL) 50 MG capsule Take 1 capsule (50 mg total) by mouth 3 (three) times daily as needed. 90 capsule 0   No current facility-administered medications for this visit.   No Known Allergies   Exam:  BP 121/73 mmHg  Pulse 99  Wt 150 lb (68.04 kg) Gen: Well NAD Left elbow is normal-appearing with no skin changes. Positive Tinel's overlying the cubital tunnel. Normal elbow motion.   Procedure: Real-time Ultrasound Guided hydrodissection of the left ulnar nerve at the right cubital tunnel  Device: GE Logiq E  Images permanently stored and available for review in the ultrasound unit. Verbal informed consent obtained. Discussed risks and benefits of procedure. Warned about infection bleeding damage to structures skin hypopigmentation and fat atrophy among others. Patient expresses understanding and agreement Time-out conducted.  Noted no overlying erythema, induration, or other signs of local infection.  Skin prepped in a sterile fashion.  Local anesthesia: Topical Ethyl chloride.  With sterile technique and under real time  ultrasound guidance a 25-gauge 1.5 inch needle was placed superficial to the ulnar nerve in the cubital tunnel and short axis. A small amount of fluid was injected seen distended tissue. The needle was examined redirected superficially lateral and medial ulnar nerve on while the solution of lidocaine and Kenalog was being instilled into the tissue. Care was taken to avoid vascular and nerve structures. 40 mg of Kenalog and 3 mL of 1% lidocaine without epinephrine was used. Patient tolerated the  procedure well   Advised to call if fevers/chills, erythema, induration, drainage, or persistent bleeding.  Images permanently stored and available for review in the ultrasound unit.  Impression: Technically successful ultrasound guided injection.  No results found for this or any previous visit (from the past 24 hour(s)). No results found.   Please see individual assessment and plan sections.

## 2015-04-02 ENCOUNTER — Other Ambulatory Visit: Payer: Self-pay | Admitting: *Deleted

## 2015-04-02 ENCOUNTER — Other Ambulatory Visit: Payer: Self-pay | Admitting: Physician Assistant

## 2015-04-02 DIAGNOSIS — B009 Herpesviral infection, unspecified: Secondary | ICD-10-CM

## 2015-04-02 MED ORDER — VALACYCLOVIR HCL 500 MG PO TABS: 500.0000 mg | ORAL_TABLET | Freq: Every day | ORAL | Status: DC

## 2015-04-02 MED ORDER — MUPIROCIN 2 % EX OINT: TOPICAL_OINTMENT | CUTANEOUS | Status: DC

## 2015-04-10 ENCOUNTER — Other Ambulatory Visit: Payer: Self-pay | Admitting: Physician Assistant

## 2015-04-12 ENCOUNTER — Ambulatory Visit (INDEPENDENT_AMBULATORY_CARE_PROVIDER_SITE_OTHER): Payer: Commercial Managed Care - PPO | Admitting: Family Medicine

## 2015-04-12 ENCOUNTER — Encounter: Payer: Self-pay | Admitting: Family Medicine

## 2015-04-12 VITALS — BP 124/77 | HR 86 | Wt 146.0 lb

## 2015-04-12 DIAGNOSIS — M222X9 Patellofemoral disorders, unspecified knee: Secondary | ICD-10-CM | POA: Insufficient documentation

## 2015-04-12 DIAGNOSIS — M222X1 Patellofemoral disorders, right knee: Secondary | ICD-10-CM

## 2015-04-12 DIAGNOSIS — G5623 Lesion of ulnar nerve, bilateral upper limbs: Secondary | ICD-10-CM | POA: Diagnosis not present

## 2015-04-12 MED ORDER — TRAMADOL HCL 50 MG PO TABS: 50.0000 mg | ORAL_TABLET | Freq: Two times a day (BID) | ORAL | Status: DC | PRN

## 2015-04-12 NOTE — Progress Notes (Signed)
Annette Cook is a 29 y.o. female who presents to [facility] today for follow-up bilateral cubital tunnel and discussed new Right knee pain.  Cubital tunnel bilaterally: Patient is status post injections bilaterally for cubital tunnel and feeling much better. She denies any radicular pain or tingling to her ulnar hand. She notes mild to minor bilateral thumb tingling occasionally. This is a minor concern. Overall she is quite pleased with how well her elbows are doing.  Additionally patient notes new right knee pain. This is been ongoing for a few weeks. Pain is felt in the anterior aspect of the knee and is worse with standing from a seated position climbing stairs and sitting for long period of time. She's tried some over-the-counter medicines that helped a little.   Past Medical History  Diagnosis Date  . ADHD (attention deficit hyperactivity disorder)   . Anxiety   . Insomnia   . GERD (gastroesophageal reflux disease)    Past Surgical History  Procedure Laterality Date  . Appendectomy    . Wisdom tooth extraction    . Tonsillectomy and adenoidectomy     Social History  Substance Use Topics  . Smoking status: Former Smoker -- 1.00 packs/day for 7 years    Types: Cigarettes    Quit date: 10/22/2004  . Smokeless tobacco: Never Used  . Alcohol Use: Yes     Comment: socially   family history includes Hypertension in her mother; Stroke in her mother.  ROS as above Medications: Current Outpatient Prescriptions  Medication Sig Dispense Refill  . clonazePAM (KLONOPIN) 1 MG tablet Take 1 tablet (1 mg total) by mouth 3 (three) times daily as needed for anxiety. For acute stress reaction 21 tablet 0  . cyclobenzaprine (FLEXERIL) 10 MG tablet TAKE 1/2 TO 1 TABLET BY  MOUTH EVERY 8 HOURS AS  NEEDED FOR MUSCLE SPASM 40 tablet 11  . HYDROcodone-homatropine (HYCODAN) 5-1.5 MG/5ML  syrup Take 5 mLs by mouth at bedtime as needed for cough. 180 mL 0  . hydrOXYzine (VISTARIL) 50 MG capsule Take 1 capsule (50 mg total) by mouth 3 (three) times daily as needed. 90 capsule 0  . meloxicam (MOBIC) 15 MG tablet TAKE 1 TABLET EVERY DAY 30 tablet 1  . mupirocin ointment (BACTROBAN) 2 % Apply to affected area TID for 7 days. 90 g 1  . neomycin-polymyxin-hydrocortisone (CORTISPORIN) otic solution Place 4 drops into the right ear 4 (four) times daily. 10 mL 0  . pantoprazole (PROTONIX) 20 MG tablet Take 1 tablet by mouth two  times daily 180 tablet 1  . Prenat-FeBis-FePro-FA-CA-Omega (COMPLETE NATAL DHA) 29-1-200 & 250 MG MISC Take one tablet daily. 90 each 4  . traMADol (ULTRAM) 50 MG tablet Take 1 tablet (50 mg total) by mouth every 12 (twelve) hours as needed. 15 tablet 0  . valACYclovir (VALTREX) 500 MG tablet Take 1 tablet (500 mg total) by mouth at bedtime. 90 tablet 3  . zolpidem (AMBIEN) 10 MG tablet Take 1 tablet (10 mg total) by mouth at bedtime as needed for sleep. Due for follow up appointment. 90 tablet 0   No current facility-administered medications for this visit.   No Known Allergies   Exam:  BP 124/77 mmHg  Pulse 86  Wt 146 lb (66.225 kg) Gen: Well NAD Elbows normal-appearing bilaterally with get of Tinel's overlying the cubital tunnel. Normal motion. Wrists are normal. Bilaterally with negative Tinel's overlying the carpal tunnel. Normal wrist motion  strength capillary refill and pulses. Sensation are intact distally Right knee is normal appearing and nontender. Negative crepitations on extension. Full range of motion 0-120. Nontender. Stable ligamentous exam.   No results found for this or any previous visit (from the past 24 hour(s)). No results found.   Please see individual assessment and plan sections.

## 2015-04-12 NOTE — Assessment & Plan Note (Signed)
Doing well. Continue bracing. Check in a few weeks to months.

## 2015-04-12 NOTE — Patient Instructions (Signed)
Thank you for coming in today. Continue the bracing.  Use tramadol sparingly.  Do the leg exercises.  1) Straight Leg raise with toe rotated outward.  2) Side leg raise.  3) Box squeeze knee extension  2-3 sets of 30 reps daily.   Return in 1 month or so.

## 2015-04-12 NOTE — Assessment & Plan Note (Signed)
Symptoms likely patellofemoral pain. Work on [employer] and hip abduction strength. Return as needed. Small amount of tramadol as needed for pain

## 2015-04-28 ENCOUNTER — Other Ambulatory Visit: Payer: Self-pay | Admitting: Physician Assistant

## 2015-04-30 ENCOUNTER — Other Ambulatory Visit: Payer: Self-pay | Admitting: Physician Assistant

## 2015-05-01 MED ORDER — ZOLPIDEM TARTRATE 10 MG PO TABS: 10.0000 mg | ORAL_TABLET | Freq: Every evening | ORAL | Status: DC | PRN

## 2015-05-01 MED ORDER — CLONAZEPAM 1 MG PO TABS
1.0000 mg | ORAL_TABLET | Freq: Three times a day (TID) | ORAL | Status: DC | PRN
Start: 2015-05-01 — End: 2015-08-31

## 2015-06-12 ENCOUNTER — Other Ambulatory Visit: Payer: Self-pay | Admitting: Physician Assistant

## 2015-06-12 MED ORDER — BENZONATATE 200 MG PO CAPS: 200.0000 mg | ORAL_CAPSULE | Freq: Two times a day (BID) | ORAL | Status: DC | PRN

## 2015-06-12 NOTE — Progress Notes (Signed)
Pt has some URI symptoms with cough. She request cough suppressant. Sent tessalon pearles.

## 2015-06-16 ENCOUNTER — Other Ambulatory Visit: Payer: Self-pay | Admitting: Physician Assistant

## 2015-07-16 ENCOUNTER — Other Ambulatory Visit: Payer: Self-pay | Admitting: Obstetrics & Gynecology

## 2015-07-16 ENCOUNTER — Other Ambulatory Visit: Payer: Self-pay | Admitting: Physician Assistant

## 2015-07-19 ENCOUNTER — Other Ambulatory Visit: Payer: Self-pay | Admitting: *Deleted

## 2015-07-19 MED ORDER — COMPLETE NATAL DHA 29-1-200 & 250 MG PO MISC: ORAL | Status: DC

## 2015-07-24 ENCOUNTER — Other Ambulatory Visit: Payer: Self-pay | Admitting: *Deleted

## 2015-07-24 MED ORDER — CLONAZEPAM 1 MG PO TABS: 1.0000 mg | ORAL_TABLET | Freq: Two times a day (BID) | ORAL | Status: DC | PRN

## 2015-08-04 ENCOUNTER — Other Ambulatory Visit: Payer: Self-pay | Admitting: Physician Assistant

## 2015-08-04 MED ORDER — ALBUTEROL SULFATE HFA 108 (90 BASE) MCG/ACT IN AERS: INHALATION_SPRAY | RESPIRATORY_TRACT | Status: DC

## 2015-08-04 NOTE — Progress Notes (Signed)
Pt has hx of asthma as a child. Noticing wheezing and SOB during exercise. Will send albuterol inhaler.

## 2015-08-09 ENCOUNTER — Telehealth: Payer: Self-pay

## 2015-08-09 NOTE — Telephone Encounter (Signed)
Pt advised. Also advised pt to schedule an appointment to discuss pain and other treatment options if she feels it is necessary. Pt verbalized understanding.

## 2015-08-09 NOTE — Telephone Encounter (Signed)
Those doses are safe at that level.

## 2015-08-09 NOTE — Telephone Encounter (Signed)
Pt called stating that she has been managing the elbow and wrist pain you have been treating her for with 500mg  tylenol and 400mg  ibuprofen taking one of each in the morning and at night daily. A family member was recently hospitalized for complications related to taking these meds for back pain. She would like to know if it is ok for her to continue to treat her pain this way? If not she would like a recommendation for how to safely manage her pain at home.

## 2015-08-15 ENCOUNTER — Other Ambulatory Visit: Payer: Self-pay | Admitting: *Deleted

## 2015-08-15 ENCOUNTER — Other Ambulatory Visit: Payer: Self-pay | Admitting: Physician Assistant

## 2015-08-15 DIAGNOSIS — B009 Herpesviral infection, unspecified: Secondary | ICD-10-CM

## 2015-08-15 MED ORDER — ZOLPIDEM TARTRATE 10 MG PO TABS: 10.0000 mg | ORAL_TABLET | Freq: Every evening | ORAL | Status: DC | PRN

## 2015-08-15 MED ORDER — VALACYCLOVIR HCL 500 MG PO TABS: 500.0000 mg | ORAL_TABLET | Freq: Every day | ORAL | Status: DC

## 2015-08-29 ENCOUNTER — Other Ambulatory Visit: Payer: Self-pay | Admitting: Physician Assistant

## 2015-08-29 MED ORDER — FLUTICASONE PROPIONATE 50 MCG/ACT NA SUSP: 2.0000 | Freq: Every day | NASAL | Status: DC

## 2015-08-30 ENCOUNTER — Other Ambulatory Visit: Payer: Self-pay

## 2015-08-30 MED ORDER — FLUTICASONE PROPIONATE 50 MCG/ACT NA SUSP: 2.0000 | Freq: Every day | NASAL | Status: DC

## 2015-08-31 ENCOUNTER — Encounter: Payer: Self-pay | Admitting: Physician Assistant

## 2015-08-31 ENCOUNTER — Ambulatory Visit (INDEPENDENT_AMBULATORY_CARE_PROVIDER_SITE_OTHER): Payer: Commercial Managed Care - PPO | Admitting: Physician Assistant

## 2015-08-31 VITALS — BP 106/65 | HR 102 | Ht 61.0 in | Wt 132.0 lb

## 2015-08-31 DIAGNOSIS — F418 Other specified anxiety disorders: Secondary | ICD-10-CM | POA: Diagnosis not present

## 2015-08-31 DIAGNOSIS — F32A Depression, unspecified: Secondary | ICD-10-CM

## 2015-08-31 DIAGNOSIS — Z131 Encounter for screening for diabetes mellitus: Secondary | ICD-10-CM

## 2015-08-31 DIAGNOSIS — E7800 Pure hypercholesterolemia, unspecified: Secondary | ICD-10-CM | POA: Diagnosis not present

## 2015-08-31 DIAGNOSIS — F329 Major depressive disorder, single episode, unspecified: Secondary | ICD-10-CM

## 2015-08-31 DIAGNOSIS — Z0000 Encounter for general adult medical examination without abnormal findings: Secondary | ICD-10-CM | POA: Diagnosis not present

## 2015-08-31 DIAGNOSIS — F419 Anxiety disorder, unspecified: Secondary | ICD-10-CM

## 2015-08-31 MED ORDER — CLONAZEPAM 1 MG PO TABS: 1.0000 mg | ORAL_TABLET | Freq: Two times a day (BID) | ORAL | Status: DC | PRN

## 2015-08-31 MED ORDER — BUPROPION HCL ER (XL) 150 MG PO TB24: 150.0000 mg | ORAL_TABLET | ORAL | Status: DC

## 2015-08-31 NOTE — Patient Instructions (Signed)

## 2015-09-03 ENCOUNTER — Other Ambulatory Visit: Payer: Self-pay | Admitting: Physician Assistant

## 2015-09-03 DIAGNOSIS — F419 Anxiety disorder, unspecified: Secondary | ICD-10-CM | POA: Insufficient documentation

## 2015-09-03 DIAGNOSIS — F32A Depression, unspecified: Secondary | ICD-10-CM | POA: Insufficient documentation

## 2015-09-03 DIAGNOSIS — F329 Major depressive disorder, single episode, unspecified: Secondary | ICD-10-CM | POA: Insufficient documentation

## 2015-09-03 NOTE — Progress Notes (Signed)
Subjective:     Annette Cook is a 30 y.o. female and is here for a comprehensive physical exam. The patient reports problems - she is having some problems in her marriage. her husband and herself have seperation papers. she feels like she is doing everything alone. husband is not willing to go to counseling.she is exhausted from taking care of 3 kids. she finds herself frustrated and down a lot. she is taking her klonapin twice a day. she often feels the need to take more. denies any suicidal or homicidal thoughs. .  Social History   Social History  . Marital Status: Married    Spouse Name: N/A  . Number of Children: N/A  . Years of Education: N/A   Occupational History  . front desk coordinator    Social History Main Topics  . Smoking status: Former Smoker -- 1.00 packs/day for 7 years    Types: Cigarettes    Quit date: 10/22/2004  . Smokeless tobacco: Never Used  . Alcohol Use: Yes     Comment: socially  . Drug Use: No  . Sexual Activity:    Partners: Male   Other Topics Concern  . Not on file   Social History Narrative   Marital Status: Married Barrister's clerk)    Children:  Son Nurse, learning disability) Daughter (Remi)    Pets: None    Living Situation: Lives with husband, step-daughter & children.    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      Alcohol Use:  None   Drug Use:  None   Diet:  Regular   Exercise:  None   Hobbies: Fishing [facility])          Health Maintenance  Topic Date Due  . INFLUENZA VACCINE  11/13/2015  . PAP SMEAR  07/13/2017  . TETANUS/TDAP  03/17/2024  . HIV Screening  Completed    The following portions of the patient's history were reviewed and updated as appropriate: allergies, current medications, past family history, past medical history, past social history, past surgical history and problem list.  Review of Systems Pertinent  items noted in HPI and remainder of comprehensive ROS otherwise negative.   Objective:    BP 106/65 mmHg  Pulse 102  Ht 5\' 1"  (1.549 m)  Wt 132 lb (59.875 kg)  BMI 24.95 kg/m2 General appearance: alert, cooperative and appears stated age Head: Normocephalic, without obvious abnormality, atraumatic Eyes: conjunctivae/corneas clear. PERRL, EOM's intact. Fundi benign. Ears: normal TM's and external ear canals both ears Nose: Nares normal. Septum midline. Mucosa normal. No drainage or sinus tenderness. Throat: lips, mucosa, and tongue normal; teeth and gums normal Neck: no adenopathy, no carotid bruit, no JVD, supple, symmetrical, trachea midline and thyroid not enlarged, symmetric, no tenderness/mass/nodules Back: symmetric, no curvature. ROM normal. No CVA tenderness. Lungs: clear to auscultation bilaterally Breasts: normal appearance, no masses or tenderness Heart: regular rate and rhythm, S1, S2 normal, no murmur, click, rub or gallop Abdomen: soft, non-tender; bowel sounds normal; no masses,  no organomegaly Pelvic: external genitalia normal, no adnexal masses or tenderness, no cervical motion tenderness, uterus normal size, shape, and consistency and vagina normal without discharge Extremities: extremities normal, atraumatic, no cyanosis or edema Pulses: 2+ and symmetric Skin: Skin color, texture, turgor normal. No rashes or lesions Lymph nodes: Cervical, supraclavicular, and axillary nodes normal. Neurologic: Grossly normal  Assessment:    Healthy female exam.      Plan:    CPE- no pap needed last pap 07/2014. bimanuel normal today. Lipid and cmp ordered. Discussed calcium 1500mg  and vitamin d 800 units. Encouraged exercise.   Anxiety/depression- encouraged personal counseling even if husband will not go to couple counseling. Started wellbutrin. Discussed MOA and SE's. Follow up in 4-6 weeks.  See After Visit Summary for Counseling Recommendations

## 2015-10-29 ENCOUNTER — Other Ambulatory Visit: Payer: Self-pay | Admitting: Physician Assistant

## 2015-10-31 ENCOUNTER — Other Ambulatory Visit: Payer: Self-pay | Admitting: Physician Assistant

## 2015-11-01 ENCOUNTER — Other Ambulatory Visit: Payer: Self-pay

## 2015-11-01 MED ORDER — BUPROPION HCL ER (XL) 150 MG PO TB24: 150.0000 mg | ORAL_TABLET | ORAL | Status: DC

## 2015-11-05 ENCOUNTER — Other Ambulatory Visit: Payer: Self-pay | Admitting: Physician Assistant

## 2015-11-05 MED ORDER — BUPROPION HCL ER (XL) 150 MG PO TB24: 150.0000 mg | ORAL_TABLET | ORAL | 1 refills | Status: DC

## 2015-11-07 ENCOUNTER — Ambulatory Visit: Payer: Commercial Managed Care - PPO | Admitting: Physician Assistant

## 2015-11-12 ENCOUNTER — Ambulatory Visit (INDEPENDENT_AMBULATORY_CARE_PROVIDER_SITE_OTHER): Payer: Commercial Managed Care - PPO | Admitting: Physician Assistant

## 2015-11-12 ENCOUNTER — Encounter: Payer: Self-pay | Admitting: Physician Assistant

## 2015-11-12 VITALS — BP 100/67 | HR 98 | Ht 61.0 in | Wt 128.0 lb

## 2015-11-12 DIAGNOSIS — F419 Anxiety disorder, unspecified: Secondary | ICD-10-CM

## 2015-11-12 DIAGNOSIS — R110 Nausea: Secondary | ICD-10-CM

## 2015-11-12 DIAGNOSIS — F411 Generalized anxiety disorder: Secondary | ICD-10-CM

## 2015-11-12 DIAGNOSIS — G5623 Lesion of ulnar nerve, bilateral upper limbs: Secondary | ICD-10-CM | POA: Diagnosis not present

## 2015-11-12 DIAGNOSIS — F430 Acute stress reaction: Secondary | ICD-10-CM

## 2015-11-12 DIAGNOSIS — F410 Panic disorder [episodic paroxysmal anxiety] without agoraphobia: Secondary | ICD-10-CM

## 2015-11-12 DIAGNOSIS — F418 Other specified anxiety disorders: Secondary | ICD-10-CM

## 2015-11-12 DIAGNOSIS — F329 Major depressive disorder, single episode, unspecified: Secondary | ICD-10-CM

## 2015-11-12 DIAGNOSIS — F32A Depression, unspecified: Secondary | ICD-10-CM

## 2015-11-12 MED ORDER — COMPLETE NATAL DHA 29-1-200 & 250 MG PO MISC: ORAL | 4 refills | Status: DC

## 2015-11-12 MED ORDER — TRAMADOL HCL 50 MG PO TABS: 50.0000 mg | ORAL_TABLET | Freq: Two times a day (BID) | ORAL | 0 refills | Status: DC | PRN

## 2015-11-12 MED ORDER — BUPROPION HCL ER (XL) 150 MG PO TB24: 150.0000 mg | ORAL_TABLET | ORAL | 5 refills | Status: DC

## 2015-11-13 ENCOUNTER — Encounter: Payer: Self-pay | Admitting: Physician Assistant

## 2015-11-13 NOTE — Progress Notes (Signed)
   Subjective:    Patient ID: Annette Cook, female    DOB: 09-22-1985, 30 y.o.   MRN: [id number]  HPI Patient is a 30 year old female who presents to the clinic today for follow-up. She needs refill on Wellbutrin. She does feel like this is helped with her overall anxiety and stress. She denies any recent panic attacks. She still does use Klonopin at least once a day for acute anxiety. She does feel like her relationship with her husband has improved somewhat. He feels like she is a lot easier to deal with being on the medication. She denies any suicidal or homicidal thoughts. For the past she met she has noticed nausea but seems to be clustered at night. She wonders if it could be coming from the Wellbutrin. She also takes a multivitamin at bedtime. She is a little concerned because she has gotten pregnant breast-feeding before. She often has nighttime nausea when she is pregnant. She did take one home pregnancy test which was negative.  She continues to have pain from her cubital tunnel syndrome. Pain is in bilateral elbows and radiates into wrist and palms. She's been seen by sports medicine here in office. They have done a workup and she has had bilateral injections and been offered anti-inflammatories. She takes tramadol and when she doesn't take tramadol she takes 2 Tylenol into ibuprofen. She is concerned because she has to take something for pain every day and sometimes multiple times a day.   Review of Systems  All other systems reviewed and are negative.      Objective:   Physical Exam  Constitutional: She is oriented to person, place, and time. She appears well-developed and well-nourished.  HENT:  Head: Normocephalic and atraumatic.  Cardiovascular: Normal rate, regular rhythm and normal heart sounds.   Pulmonary/Chest: Effort normal and breath sounds normal.  Neurological: She is alert and oriented to person, place, and time.  Skin: Skin is dry.  Psychiatric: She has a normal  mood and affect. Her behavior is normal.          Assessment & Plan:  Anxiety/depression/panic attacks-it does seem that patient has responded well to Wellbutrin daily. Will continue on this. Sent refills. There could be a side effect of nausea going on here. If it does not improve we may need to consider medication changes. See workup for nausea below. Klonopin as needed refill today.  Nausea/BF mother-could be a side effect of medication. Could even be her multivitamin causing nausea. Does seem to be more clustered at night. Compared with patient she has taken the Wellbutrin in the morning. Patient is breast-feeding however she has gotten pregnant before breast-feeding. Will order a serum hCG.  Cubital tunnel syndrome of both extremities-patient has seen sports medicine in the office with Dr. Georgina Snell. She has had bilateral injections and been wearing splints. She has been on daily anti-inflammatories. At times she is taking either tramadol or 2 Tylenol into ibuprofen a day to help with pain. She would like referral to orthopedics at this time.

## 2015-12-12 ENCOUNTER — Other Ambulatory Visit: Payer: Self-pay | Admitting: Physician Assistant

## 2015-12-13 ENCOUNTER — Other Ambulatory Visit: Payer: Self-pay | Admitting: *Deleted

## 2015-12-13 MED ORDER — ZOLPIDEM TARTRATE 10 MG PO TABS: 10.0000 mg | ORAL_TABLET | Freq: Every evening | ORAL | 0 refills | Status: DC | PRN

## 2015-12-21 ENCOUNTER — Other Ambulatory Visit: Payer: Self-pay | Admitting: *Deleted

## 2015-12-21 MED ORDER — PANTOPRAZOLE SODIUM 20 MG PO TBEC: DELAYED_RELEASE_TABLET | ORAL | 0 refills | Status: DC

## 2015-12-31 ENCOUNTER — Other Ambulatory Visit: Payer: Self-pay | Admitting: Physician Assistant

## 2016-01-01 ENCOUNTER — Other Ambulatory Visit: Payer: Self-pay | Admitting: *Deleted

## 2016-01-01 MED ORDER — CYCLOBENZAPRINE HCL 10 MG PO TABS: ORAL_TABLET | ORAL | 1 refills | Status: DC

## 2016-01-17 ENCOUNTER — Other Ambulatory Visit: Payer: Self-pay | Admitting: Physician Assistant

## 2016-01-21 ENCOUNTER — Other Ambulatory Visit: Payer: Self-pay | Admitting: *Deleted

## 2016-02-12 ENCOUNTER — Ambulatory Visit (INDEPENDENT_AMBULATORY_CARE_PROVIDER_SITE_OTHER): Payer: Commercial Managed Care - PPO | Admitting: Physician Assistant

## 2016-02-12 DIAGNOSIS — Z23 Encounter for immunization: Secondary | ICD-10-CM

## 2016-02-12 NOTE — Progress Notes (Signed)
Flu shot given

## 2016-02-14 LAB — COMPLETE METABOLIC PANEL WITH GFR
ALT: 14 U/L (ref 6–29)
AST: 17 U/L (ref 10–30)
Albumin: 4.4 g/dL (ref 3.6–5.1)
Alkaline Phosphatase: 44 U/L (ref 33–115)
BILIRUBIN TOTAL: 0.9 mg/dL (ref 0.2–1.2)
BUN: 11 mg/dL (ref 7–25)
CO2: 26 mmol/L (ref 20–31)
CREATININE: 0.62 mg/dL (ref 0.50–1.10)
Calcium: 9 mg/dL (ref 8.6–10.2)
Chloride: 102 mmol/L (ref 98–110)
GFR, Est African American: >89 mL/min (ref >=60)
GLUCOSE: 86 mg/dL (ref 65–99)
Potassium: 4 mmol/L (ref 3.5–5.3)
SODIUM: 138 mmol/L (ref 135–146)
TOTAL PROTEIN: 6.7 g/dL (ref 6.1–8.1)

## 2016-02-14 LAB — LIPID PANEL
Cholesterol: 227 mg/dL — ABNORMAL HIGH (ref 125–200)
HDL: 114 mg/dL (ref >=46)
LDL CALC: 96 mg/dL (ref <130)
Total CHOL/HDL Ratio: 2 Ratio (ref <=5.0)
Triglycerides: 83 mg/dL (ref <150)
VLDL: 17 mg/dL (ref <30)

## 2016-02-14 LAB — HCG, SERUM, QUALITATIVE: Preg, Serum: NEGATIVE

## 2016-03-08 ENCOUNTER — Other Ambulatory Visit: Payer: Self-pay | Admitting: Physician Assistant

## 2016-03-12 ENCOUNTER — Other Ambulatory Visit: Payer: Self-pay | Admitting: *Deleted

## 2016-03-12 DIAGNOSIS — B009 Herpesviral infection, unspecified: Secondary | ICD-10-CM

## 2016-03-12 MED ORDER — VALACYCLOVIR HCL 500 MG PO TABS: 500.0000 mg | ORAL_TABLET | Freq: Every day | ORAL | 3 refills | Status: DC

## 2016-03-12 MED ORDER — FLUTICASONE PROPIONATE 50 MCG/ACT NA SUSP: 2.0000 | Freq: Every day | NASAL | 3 refills | Status: DC

## 2016-03-12 MED ORDER — ALBUTEROL SULFATE HFA 108 (90 BASE) MCG/ACT IN AERS: INHALATION_SPRAY | RESPIRATORY_TRACT | 2 refills | Status: DC

## 2016-03-12 MED ORDER — BENZONATATE 200 MG PO CAPS: 200.0000 mg | ORAL_CAPSULE | Freq: Two times a day (BID) | ORAL | 1 refills | Status: DC | PRN

## 2016-03-12 MED ORDER — ZOLPIDEM TARTRATE 10 MG PO TABS
10.0000 mg | ORAL_TABLET | Freq: Every evening | ORAL | 0 refills | Status: DC | PRN
Start: 2016-03-12 — End: 2016-08-01

## 2016-03-28 ENCOUNTER — Other Ambulatory Visit: Payer: Self-pay | Admitting: *Deleted

## 2016-03-28 MED ORDER — HYDROCODONE-HOMATROPINE 5-1.5 MG/5ML PO SYRP: 5.0000 mL | ORAL_SOLUTION | Freq: Every evening | ORAL | 0 refills | Status: DC | PRN

## 2016-03-31 ENCOUNTER — Other Ambulatory Visit: Payer: Self-pay | Admitting: *Deleted

## 2016-03-31 MED ORDER — BUPROPION HCL ER (XL) 150 MG PO TB24: 150.0000 mg | ORAL_TABLET | ORAL | 1 refills | Status: DC

## 2016-03-31 MED ORDER — CYCLOBENZAPRINE HCL 10 MG PO TABS: ORAL_TABLET | ORAL | 1 refills | Status: DC

## 2016-04-08 MED FILL — HYDROCODONE-HOMATROPINE SOL: 5-1.5 | 24 days supply | Qty: 120 | Fill #0

## 2016-04-09 ENCOUNTER — Other Ambulatory Visit: Payer: Self-pay | Admitting: *Deleted

## 2016-04-09 MED ORDER — CLONAZEPAM 1 MG PO TABS: 1.0000 mg | ORAL_TABLET | Freq: Two times a day (BID) | ORAL | 1 refills | Status: DC | PRN

## 2016-04-28 ENCOUNTER — Encounter: Payer: Self-pay | Admitting: Physician Assistant

## 2016-04-28 ENCOUNTER — Ambulatory Visit (INDEPENDENT_AMBULATORY_CARE_PROVIDER_SITE_OTHER): Payer: Managed Care, Other (non HMO) | Admitting: Physician Assistant

## 2016-04-28 VITALS — BP 108/72 | HR 84 | Ht 61.0 in | Wt 132.0 lb

## 2016-04-28 DIAGNOSIS — R059 Cough, unspecified: Secondary | ICD-10-CM

## 2016-04-28 DIAGNOSIS — F909 Attention-deficit hyperactivity disorder, unspecified type: Secondary | ICD-10-CM | POA: Diagnosis not present

## 2016-04-28 DIAGNOSIS — G5620 Lesion of ulnar nerve, unspecified upper limb: Secondary | ICD-10-CM | POA: Diagnosis not present

## 2016-04-28 DIAGNOSIS — R05 Cough: Secondary | ICD-10-CM

## 2016-04-28 DIAGNOSIS — F411 Generalized anxiety disorder: Secondary | ICD-10-CM

## 2016-04-28 MED ORDER — PANTOPRAZOLE SODIUM 20 MG PO TBEC
20.0000 mg | DELAYED_RELEASE_TABLET | Freq: Two times a day (BID) | ORAL | 3 refills | Status: DC
Start: 2016-04-28 — End: 2016-05-29

## 2016-04-28 MED ORDER — BUPROPION HCL ER (XL) 150 MG PO TB24: 150.0000 mg | ORAL_TABLET | ORAL | 1 refills | Status: DC

## 2016-04-28 MED ORDER — HYDROCODONE-ACETAMINOPHEN 5-325 MG PO TABS: ORAL_TABLET | ORAL | 0 refills | Status: DC

## 2016-04-28 MED ORDER — BENZONATATE 200 MG PO CAPS: 200.0000 mg | ORAL_CAPSULE | Freq: Two times a day (BID) | ORAL | 1 refills | Status: DC | PRN

## 2016-04-28 MED ORDER — METHYLPHENIDATE HCL ER (OSM) 36 MG PO TBCR: 36.0000 mg | EXTENDED_RELEASE_TABLET | Freq: Every day | ORAL | 0 refills | Status: DC

## 2016-04-28 MED ORDER — CLONAZEPAM 1 MG PO TABS: 1.0000 mg | ORAL_TABLET | Freq: Two times a day (BID) | ORAL | 1 refills | Status: DC | PRN

## 2016-04-28 MED ORDER — GABAPENTIN 300 MG PO CAPS: 300.0000 mg | ORAL_CAPSULE | Freq: Three times a day (TID) | ORAL | 5 refills | Status: DC

## 2016-04-28 MED ORDER — TRAMADOL HCL 50 MG PO TABS: ORAL_TABLET | ORAL | 2 refills | Status: DC

## 2016-04-28 NOTE — Progress Notes (Addendum)
Subjective:    Patient ID: Annette Cook, female    DOB: March 16, 1986, 31 y.o.   MRN: [id number]  HPI Patient is a 31yo female who presents to the clinic for medication refills.  Patient would like to discuss getting back on Concerta to manage her ADHD now that she is back at work.  Patient reports she has had formal testing.   Patient also reports she has subluxation of ulnar nerve bilaterally.  Patient reports she has seen ortho and sports medicine.  She states she is currently on norco, gabapentin, and tramadol.  She has already tried physical therapy, injections, and received an MRI. Patient is interested in having surgery.  Patient states her ortho doctor said that her PCP needs to manage her pain at this point and will not refill her pain medications.    Patient states she has allergic rhinitis and a post-nasal drip cough periodically.  She states she would like a refill of tessalon pearls for her cough at night.        Review of Systems  All other systems reviewed and are negative.      Objective:   Physical Exam  Constitutional: She is oriented to person, place, and time. She appears well-developed and well-nourished.  HENT:  Head: Normocephalic and atraumatic.  Cardiovascular: Normal rate, regular rhythm and normal heart sounds.   Pulmonary/Chest: Effort normal and breath sounds normal.  Neurological: She is alert and oriented to person, place, and time.  Psychiatric: She has a normal mood and affect. Her behavior is normal.  Nursing note and vitals reviewed.         Assessment & Plan:  Marland KitchenMarland KitchenDiagnoses and all orders for this visit:  Ulnar nerve entrapment, unspecified laterality -     traMADol (ULTRAM) 50 MG tablet; TAKE 1 TABLET EVERY 12 HOURS AS NEEDED -     gabapentin (NEURONTIN) 300 MG capsule; Take 1 capsule (300 mg total) by mouth 3 (three) times daily. -     HYDROcodone-acetaminophen (NORCO/VICODIN) 5-325 MG tablet; Take one tablet as needed in the evening for  bilateral subluxation of ulnar nerve.  Generalized anxiety disorder -     buPROPion (WELLBUTRIN XL) 150 MG 24 hr tablet; Take 1 tablet (150 mg total) by mouth every morning. -     clonazePAM (KLONOPIN) 1 MG tablet; Take 1 tablet (1 mg total) by mouth 2 (two) times daily as needed for anxiety.  Attention deficit hyperactivity disorder (ADHD), unspecified ADHD type -     methylphenidate (CONCERTA) 36 MG PO CR tablet; Take 1 tablet (36 mg total) by mouth daily.  Cough -     benzonatate (TESSALON) 200 MG capsule; Take 1 capsule (200 mg total) by mouth 2 (two) times daily as needed for cough.  Other orders -     pantoprazole (PROTONIX) 20 MG tablet; Take 1 tablet (20 mg total) by mouth 2 (two) times daily. -     Discontinue: methylphenidate (CONCERTA) 36 MG PO CR tablet; Take 1 tablet (36 mg total) by mouth daily. -     Discontinue: methylphenidate (CONCERTA) 36 MG PO CR tablet; Take 1 tablet (36 mg total) by mouth daily.    Patient's medications listed above were refilled. Opioid pain contract completed today.  She was informed she can continue to take Mobic 15mg  daily for pain relief.   Patient can take tessalon as needed for cough.  Patient was started back on concentra 36mg  for ADHD since she has completed breastfeeding.  Follow-up in 3 months for medication refills.

## 2016-04-29 ENCOUNTER — Ambulatory Visit: Payer: Commercial Managed Care - PPO | Admitting: Physician Assistant

## 2016-05-02 ENCOUNTER — Ambulatory Visit: Payer: Commercial Managed Care - PPO | Admitting: Physician Assistant

## 2016-05-05 ENCOUNTER — Ambulatory Visit: Payer: Commercial Managed Care - PPO | Admitting: Physician Assistant

## 2016-05-29 ENCOUNTER — Other Ambulatory Visit: Payer: Self-pay | Admitting: *Deleted

## 2016-05-29 MED ORDER — PANTOPRAZOLE SODIUM 20 MG PO TBEC: 20.0000 mg | DELAYED_RELEASE_TABLET | Freq: Two times a day (BID) | ORAL | 3 refills | Status: DC

## 2016-06-04 ENCOUNTER — Other Ambulatory Visit: Payer: Self-pay | Admitting: Physician Assistant

## 2016-06-26 ENCOUNTER — Telehealth: Payer: Self-pay | Admitting: *Deleted

## 2016-06-26 NOTE — Telephone Encounter (Signed)
Received call from [place] with [employer], no auth required if brand name Concerta is filled. Per pharmacy, they already filled Rx as brand name. [employer] will cancel recently submitted request.

## 2016-06-26 NOTE — Telephone Encounter (Signed)
Pre Authorization sent to cover my meds.[id number]

## 2016-07-01 NOTE — Telephone Encounter (Signed)
noted 

## 2016-07-08 ENCOUNTER — Other Ambulatory Visit: Payer: Self-pay | Admitting: *Deleted

## 2016-07-08 DIAGNOSIS — G5620 Lesion of ulnar nerve, unspecified upper limb: Secondary | ICD-10-CM

## 2016-07-08 MED ORDER — HYDROCODONE-ACETAMINOPHEN 5-325 MG PO TABS: ORAL_TABLET | ORAL | 0 refills | Status: DC

## 2016-07-17 ENCOUNTER — Ambulatory Visit (INDEPENDENT_AMBULATORY_CARE_PROVIDER_SITE_OTHER): Payer: BLUE CROSS/BLUE SHIELD | Admitting: Family Medicine

## 2016-07-17 ENCOUNTER — Encounter: Payer: Self-pay | Admitting: Family Medicine

## 2016-07-17 VITALS — BP 115/68 | HR 100 | Temp 98.0°F | Ht 61.0 in | Wt 123.0 lb

## 2016-07-17 DIAGNOSIS — A084 Viral intestinal infection, unspecified: Secondary | ICD-10-CM | POA: Diagnosis not present

## 2016-07-17 MED ORDER — PROMETHAZINE HCL 25 MG PO TABS: 25.0000 mg | ORAL_TABLET | Freq: Four times a day (QID) | ORAL | 2 refills | Status: DC | PRN

## 2016-07-17 MED ORDER — PROMETHAZINE HCL 25 MG RE SUPP: 25.0000 mg | Freq: Four times a day (QID) | RECTAL | 0 refills | Status: DC | PRN

## 2016-07-17 MED ORDER — PREDNISONE 10 MG PO TABS: 30.0000 mg | ORAL_TABLET | Freq: Every day | ORAL | 0 refills | Status: DC

## 2016-07-17 NOTE — Patient Instructions (Signed)
Thank you for coming in today. Continue tylenol and ibuprofen USe either oral phenergan or rectal suppository phenergan.  Take prednisone if not better.  Call or go to the emergency room if you get worse, have trouble breathing, have chest pains, or palpitations.   If your belly pain worsens, or you have high fever, bad vomiting, blood in your stool or black tarry stool go to the Emergency Room.    Viral Gastroenteritis, Adult Viral gastroenteritis is also known as the stomach flu. This condition is caused by various viruses. These viruses can be passed from person to person very easily (are very contagious). This condition may affect your stomach, small intestine, and large intestine. It can cause sudden watery diarrhea, fever, and vomiting. Diarrhea and vomiting can make you feel weak and cause you to become dehydrated. You may not be able to keep fluids down. Dehydration can make you tired and thirsty, cause you to have a dry mouth, and decrease how often you urinate. Older adults and people with other diseases or a weak immune system are at higher risk for dehydration. It is important to replace the fluids that you lose from diarrhea and vomiting. If you become severely dehydrated, you may need to get fluids through an IV tube. What are the causes? Gastroenteritis is caused by various viruses, including rotavirus and norovirus. Norovirus is the most common cause in adults. You can get sick by eating food, drinking water, or touching a surface contaminated with one of these viruses. You can also get sick from sharing utensils or other personal items with an infected person. What increases the risk? This condition is more likely to develop in people:  Who have a weak defense system (immune system).  Who live with one or more children who are younger than 21 years old.  Who live in a nursing home.  Who go on cruise ships. What are the signs or symptoms? Symptoms of this condition start  suddenly 1-2 days after exposure to a virus. Symptoms may last a few days or as long as a week. The most common symptoms are watery diarrhea and vomiting. Other symptoms include:  Fever.  Headache.  Fatigue.  Pain in the abdomen.  Chills.  Weakness.  Nausea.  Muscle aches.  Loss of appetite. How is this diagnosed? This condition is diagnosed with a medical history and physical exam. You may also have a stool test to check for viruses or other infections. How is this treated? This condition typically goes away on its own. The focus of treatment is to restore lost fluids (rehydration). Your health care provider may recommend that you take an oral rehydration solution (ORS) to replace important salts and minerals (electrolytes) in your body. Severe cases of this condition may require giving fluids through an IV tube. Treatment may also include medicine to help with your symptoms. Follow these instructions at home: Follow instructions from your health care provider about how to care for yourself at home. Eating and drinking  Follow these recommendations as told by your health care provider:  Take an ORS. This is a drink that is sold at pharmacies and retail stores.  Drink clear fluids in small amounts as you are able. Clear fluids include water, ice chips, diluted fruit juice, and low-calorie sports drinks.  Eat bland, easy-to-digest foods in small amounts as you are able. These foods include bananas, applesauce, rice, lean meats, toast, and crackers.  Avoid fluids that contain a lot of sugar or caffeine, such as  energy drinks, sports drinks, and soda.  Avoid alcohol.  Avoid spicy or fatty foods. General instructions    Drink enough fluid to keep your urine clear or pale yellow.  Wash your hands often. If soap and water are not available, use hand sanitizer.  Make sure that all people in your household wash their hands well and often.  Take over-the-counter and  prescription medicines only as told by your health care provider.  Rest at home while you recover.  Watch your condition for any changes.  Take a warm bath to relieve any burning or pain from frequent diarrhea episodes.  Keep all follow-up visits as told by your health care provider. This is important. Contact a health care provider if:  You cannot keep fluids down.  Your symptoms get worse.  You have new symptoms.  You feel light-headed or dizzy.  You have muscle cramps. Get help right away if:  You have chest pain.  You feel extremely weak or you faint.  You see blood in your vomit.  Your vomit looks like coffee grounds.  You have bloody or black stools or stools that look like tar.  You have a severe headache, a stiff neck, or both.  You have a rash.  You have severe pain, cramping, or bloating in your abdomen.  You have trouble breathing or you are breathing very quickly.  Your heart is beating very quickly.  Your skin feels cold and clammy.  You feel confused.  You have pain when you urinate.  You have signs of dehydration, such as:  Dark urine, very little urine, or no urine.  Cracked lips.  Dry mouth.  Sunken eyes.  Sleepiness.  Weakness. This information is not intended to replace advice given to you by your health care provider. Make sure you discuss any questions you have with your health care provider. Document Released: 03/31/2005 Document Revised: 09/12/2015 Document Reviewed: 12/05/2014 Elsevier Interactive Patient Education  2017 [employer].

## 2016-07-17 NOTE — Progress Notes (Signed)
Annette Cook is a 31 y.o. female who presents to [facility] today for vomiting and headache and body aches. This is associated with subjective fevers. Symptoms present for less than one day. Patient has positive sick contacts with her children. She used to leftover Phenergan which helped some. She also has tried Tylenol and ibuprofen which helped. She denies any cough or trouble breathing.   Past Medical History:  Diagnosis Date  . ADHD (attention deficit hyperactivity disorder)   . Anxiety   . GERD (gastroesophageal reflux disease)   . Insomnia    Past Surgical History:  Procedure Laterality Date  . APPENDECTOMY    . TONSILLECTOMY AND ADENOIDECTOMY    . WISDOM TOOTH EXTRACTION     Social History  Substance Use Topics  . Smoking status: Former Smoker    Packs/day: 1.00    Years: 7.00    Types: Cigarettes    Quit date: 10/22/2004  . Smokeless tobacco: Never Used  . Alcohol use Yes     Comment: socially   family history includes Hypertension in her mother; Stroke in her mother.  ROS as above:  Medications: Current Outpatient Prescriptions  Medication Sig Dispense Refill  . buPROPion (WELLBUTRIN XL) 150 MG 24 hr tablet Take 1 tablet (150 mg total) by mouth every morning. 90 tablet 1  . clonazePAM (KLONOPIN) 1 MG tablet Take 1 tablet (1 mg total) by mouth 2 (two) times daily as needed for anxiety. 180 tablet 1  . cyclobenzaprine (FLEXERIL) 10 MG tablet TAKE 1/2 TO 1 TABLET BY  MOUTH EVERY 8 HOURS AS  NEEDED FOR MUSCLE SPASM 90 tablet 1  . fluticasone (FLONASE) 50 MCG/ACT nasal spray Place 2 sprays into both nostrils daily. 48 g 3  . gabapentin (NEURONTIN) 300 MG capsule Take 1 capsule (300 mg total) by mouth 3 (three) times daily. 90 capsule 5  . HYDROcodone-acetaminophen (NORCO/VICODIN) 5-325 MG tablet Take one tablet as needed in the evening for bilateral  subluxation of ulnar nerve. 30 tablet 0  . hydrOXYzine (VISTARIL) 25 MG capsule TAKE 1 CAPSULE BY MOUTH  EVERY NIGHT AT BEDTIME AS  NEEDED 90 capsule 1  . meloxicam (MOBIC) 15 MG tablet TAKE 1 TABLET EVERY DAY 30 tablet 1  . methylphenidate (CONCERTA) 36 MG PO CR tablet Take 1 tablet (36 mg total) by mouth daily. 30 tablet 0  . mupirocin ointment (BACTROBAN) 2 % APPLY TO AFFECTED AREA(S)  TOPICALLY 3 TIMES DAILY 176 g 1  . pantoprazole (PROTONIX) 20 MG tablet Take 1 tablet (20 mg total) by mouth 2 (two) times daily. 180 tablet 3  . Prenat-FeBis-FePro-FA-CA-Omega (COMPLETE NATAL DHA) 29-1-200 & 250 MG MISC Take one tablet daily. 90 each 4  . PROAIR HFA 108 (90 Base) MCG/ACT inhaler USE 2 PUFFS 15-30 MINUTES BEFORE EXERCISE. 8.5 Inhaler 2  . traMADol (ULTRAM) 50 MG tablet TAKE 1 TABLET EVERY 12 HOURS AS NEEDED 45 tablet 2  . valACYclovir (VALTREX) 500 MG tablet Take 1 tablet (500 mg total) by mouth at bedtime. 90 tablet 3  . zolpidem (AMBIEN) 10 MG tablet Take 1 tablet (10 mg total) by mouth at bedtime as needed for sleep. 90 tablet 0  . predniSONE (DELTASONE) 10 MG tablet Take 3 tablets (30 mg total) by mouth daily with breakfast. 15 tablet 0  . promethazine (PHENERGAN) 25 MG suppository Place 1 suppository (25 mg total) rectally every 6 (six) hours as needed for nausea or vomiting.  12 each 0  . promethazine (PHENERGAN) 25 MG tablet Take 1 tablet (25 mg total) by mouth every 6 (six) hours as needed for nausea or vomiting. 30 tablet 2   No current facility-administered medications for this visit.    No Known Allergies  Health Maintenance Health Maintenance  Topic Date Due  . INFLUENZA VACCINE  11/12/2016  . PAP SMEAR  07/13/2017  . TETANUS/TDAP  03/17/2024  . HIV Screening  Completed     Exam:  BP 115/68   Pulse 100   Temp 98 F (36.7 C) (Oral)   Ht 5\' 1"  (1.549 m)   Wt 123 lb (55.8 kg)   BMI 23.24 kg/m  Gen: Well NAD Nontoxic appearing HEENT: EOMI,  MMM Lungs: Normal work of  breathing. CTABL Heart: RRR no MRG Abd: NABS, Soft. Nondistended, Nontender Exts: Brisk capillary refill, warm and well perfused.    No results found for this or any previous visit (from the past 72 hour(s)). No results found.    Assessment and Plan: 31 y.o. female with viral gastroenteritis. Empiric treatment with either oral or rectal suppository Phenergan and Tylenol or ibuprofen. Reserve prednisone for use if not improving with potential viral illness. Work note provided. Return as needed.   No orders of the defined types were placed in this encounter.  Meds ordered this encounter  Medications  . promethazine (PHENERGAN) 25 MG tablet    Sig: Take 1 tablet (25 mg total) by mouth every 6 (six) hours as needed for nausea or vomiting.    Dispense:  30 tablet    Refill:  2  . predniSONE (DELTASONE) 10 MG tablet    Sig: Take 3 tablets (30 mg total) by mouth daily with breakfast.    Dispense:  15 tablet    Refill:  0  . promethazine (PHENERGAN) 25 MG suppository    Sig: Place 1 suppository (25 mg total) rectally every 6 (six) hours as needed for nausea or vomiting.    Dispense:  12 each    Refill:  0     Discussed warning signs or symptoms. Please see discharge instructions. Patient expresses understanding.

## 2016-07-18 ENCOUNTER — Telehealth: Payer: Self-pay | Admitting: *Deleted

## 2016-07-18 NOTE — Telephone Encounter (Signed)
Pre Authorization sent to cover my meds. Annette Cook

## 2016-08-01 ENCOUNTER — Encounter: Payer: Self-pay | Admitting: Physician Assistant

## 2016-08-01 ENCOUNTER — Ambulatory Visit (INDEPENDENT_AMBULATORY_CARE_PROVIDER_SITE_OTHER): Payer: BLUE CROSS/BLUE SHIELD | Admitting: Physician Assistant

## 2016-08-01 VITALS — BP 115/80 | HR 106 | Ht 61.0 in | Wt 123.0 lb

## 2016-08-01 DIAGNOSIS — F411 Generalized anxiety disorder: Secondary | ICD-10-CM | POA: Diagnosis not present

## 2016-08-01 DIAGNOSIS — F5102 Adjustment insomnia: Secondary | ICD-10-CM | POA: Diagnosis not present

## 2016-08-01 DIAGNOSIS — F900 Attention-deficit hyperactivity disorder, predominantly inattentive type: Secondary | ICD-10-CM | POA: Diagnosis not present

## 2016-08-01 MED ORDER — AMPHETAMINE-DEXTROAMPHET ER 20 MG PO CP24: 20.0000 mg | ORAL_CAPSULE | ORAL | 0 refills | Status: DC

## 2016-08-01 MED ORDER — ZOLPIDEM TARTRATE 10 MG PO TABS: 10.0000 mg | ORAL_TABLET | Freq: Every evening | ORAL | 0 refills | Status: DC | PRN

## 2016-08-01 NOTE — Progress Notes (Signed)
   Subjective:    Patient ID: Annette Cook, female    DOB: 05-15-1985, 31 y.o.   MRN: [id number]  HPI Pt is a 31 yo female who presents to the clinic for 3 month ADHd follow up. 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 She has been on concerta for years. Her son was recently switched to adderall from [place] and doing much better on it. She would like to try it as well.   She needs refills of ambien to use as needed for insomnia. She does not use every night.    Review of Systems  All other systems reviewed and are negative.      Objective:   Physical Exam  Constitutional: She is oriented to person, place, and time. She appears well-developed and well-nourished.  HENT:  Head: Normocephalic and atraumatic.  Cardiovascular: Normal rate, regular rhythm and normal heart sounds.   Pulmonary/Chest: Effort normal and breath sounds normal.  Neurological: She is alert and oriented to person, place, and time.  Psychiatric: She has a normal mood and affect. Her behavior is normal.          Assessment & Plan:  Marland KitchenMarland KitchenGiavana was seen today for adhd.  Diagnoses and all orders for this visit:  Attention deficit hyperactivity disorder (ADHD), predominantly inattentive type -     amphetamine-dextroamphetamine (ADDERALL XR) 20 MG 24 hr capsule; Take 1 capsule (20 mg total) by mouth every morning.  Generalized anxiety disorder  Adjustment insomnia -     zolpidem (AMBIEN) 10 MG tablet; Take 1 tablet (10 mg total) by mouth at bedtime as needed for sleep.  Other orders -     Discontinue: amphetamine-dextroamphetamine (ADDERALL XR) 20 MG 24 hr capsule; Take 1 capsule (20 mg total) by mouth every morning. -     Discontinue: amphetamine-dextroamphetamine (ADDERALL XR) 20 MG 24 hr capsule; Take 1 capsule (20 mg total) by mouth every morning.   Follow up in 3 months.

## 2016-08-19 ENCOUNTER — Telehealth: Payer: Self-pay

## 2016-08-19 NOTE — Telephone Encounter (Signed)
Pt called and said that she does not feel that her ADHD medication is working.  She wants to know if she needs to give it more time?  Please advise

## 2016-08-20 NOTE — Telephone Encounter (Signed)
Not necessarily. Would you like to switch back to concerta.

## 2016-08-21 MED ORDER — METHYLPHENIDATE HCL ER (OSM) 54 MG PO TBCR: 54.0000 mg | EXTENDED_RELEASE_TABLET | ORAL | 0 refills | Status: DC

## 2016-08-21 NOTE — Telephone Encounter (Signed)
Pt will bring them in tomorrow.

## 2016-08-21 NOTE — Telephone Encounter (Signed)
Left message to call office

## 2016-08-21 NOTE — Telephone Encounter (Signed)
Pt calls and wants to switch back to concerta. She needs to bring in 2 other rx of adderall and document that she did this and she can pick up concerta. I did increase dose. Follow up in one month. Iran Planas PA-C

## 2016-09-12 ENCOUNTER — Ambulatory Visit (INDEPENDENT_AMBULATORY_CARE_PROVIDER_SITE_OTHER): Payer: BLUE CROSS/BLUE SHIELD | Admitting: Physician Assistant

## 2016-09-12 ENCOUNTER — Encounter: Payer: Self-pay | Admitting: Physician Assistant

## 2016-09-12 VITALS — BP 104/71 | HR 102 | Ht 61.0 in | Wt 118.0 lb

## 2016-09-12 DIAGNOSIS — F900 Attention-deficit hyperactivity disorder, predominantly inattentive type: Secondary | ICD-10-CM

## 2016-09-12 DIAGNOSIS — J069 Acute upper respiratory infection, unspecified: Secondary | ICD-10-CM | POA: Diagnosis not present

## 2016-09-12 DIAGNOSIS — G5620 Lesion of ulnar nerve, unspecified upper limb: Secondary | ICD-10-CM | POA: Diagnosis not present

## 2016-09-12 DIAGNOSIS — Z789 Other specified health status: Secondary | ICD-10-CM | POA: Diagnosis not present

## 2016-09-12 MED ORDER — TRAMADOL HCL 50 MG PO TABS: ORAL_TABLET | ORAL | 2 refills | Status: DC

## 2016-09-12 MED ORDER — BUPROPION HCL ER (XL) 300 MG PO TB24: 300.0000 mg | ORAL_TABLET | Freq: Every day | ORAL | 1 refills | Status: DC

## 2016-09-12 MED ORDER — AMOXICILLIN-POT CLAVULANATE 875-125 MG PO TABS: 1.0000 | ORAL_TABLET | Freq: Two times a day (BID) | ORAL | 0 refills | Status: DC

## 2016-09-12 MED ORDER — METHYLPHENIDATE HCL ER (OSM) 54 MG PO TBCR: 54.0000 mg | EXTENDED_RELEASE_TABLET | ORAL | 0 refills | Status: DC

## 2016-09-12 MED ORDER — COMPLETE NATAL DHA 29-1-200 & 250 MG PO MISC: ORAL | 4 refills | Status: DC

## 2016-09-12 NOTE — Progress Notes (Signed)
Subjective:    Patient ID: Annette Cook, female    DOB: 06-28-1985, 31 y.o.   MRN: [id number]  HPI Pt is a 31 yo female who presents to the clinic for ADHD follow up. She did not feel like she was taking anything with adderall. I switched her back to concernta 54mg . She is doing much better but still making some stupid mistakes at work. She got in trouble last week for putting a chart in the wrong box.   She continues to have teriible ST. Her throat was swabbed and [facility] were [facility] She does have a dry cough. She was also negative for flu. Low grade temperature at home. She has no energy. She can't eat or drink at this time.   She does request tramadol for as needed pain from her ulnar entrapment.   .. Active Ambulatory Problems    Diagnosis Date Noted  . Herpes 12/02/2011  . Generalized anxiety disorder 10/22/2012  . Insomnia 10/22/2012  . Panic attack 10/22/2012  . ADHD (attention deficit hyperactivity disorder) 10/22/2012  . Acute stress reaction 07/08/2013  . Shoulder dystocia, delivered, current hospitalization 06/01/2014  . Otitis, externa, infective 03/15/2015  . 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 Anxiety and depression 09/03/2015  . Takes daily multivitamins 09/12/2016  . Ulnar nerve entrapment 09/12/2016   Resolved Ambulatory Problems    Diagnosis Date Noted  . Supervision of normal IUP (intrauterine pregnancy) in multigravida 11/09/2011  . Rh negative status during pregnancy 02/03/2012  . Marginal placenta previa 02/03/2012  . Pain in throat 04/18/2013  . Encounter for supervision of other normal pregnancy in first trimester 12/07/2013  . Anxiety during pregnancy in second trimester, antepartum 01/13/2014  . Active labor 06/01/2014  . Vaginal delivery 06/01/2014  . Bilateral hand numbness 01/16/2015  . Cough 02/21/2015  . Cervical radiculopathy at C8  02/27/2015   Past Medical History:  Diagnosis Date  . ADHD (attention deficit hyperactivity disorder)   . Anxiety   . GERD (gastroesophageal reflux disease)   . Insomnia       Review of Systems  All other systems reviewed and are negative.      Objective:   Physical Exam  Constitutional: She is oriented to person, place, and time. She appears well-developed and well-nourished.  HENT:  Head: Normocephalic and atraumatic.  Right Ear: External ear normal.  Left Ear: External ear normal.  Nose: Nose normal.  Very strawberry red erythematous oropharyx with bilateral tonsil swelling no exudate.   Eyes: Conjunctivae are normal. Right eye exhibits no discharge. Left eye exhibits no discharge.  Neck:  Tender cervical adenopathy.  Cardiovascular: Normal rate, regular rhythm and normal heart sounds.   Pulmonary/Chest: Effort normal and breath sounds normal. She has no wheezes.  Lymphadenopathy:    She has cervical adenopathy.  Neurological: She is alert and oriented to person, place, and time.  Psychiatric: She has a normal mood and affect. Her behavior is normal.          Assessment & Plan:  Marland KitchenMarland KitchenTamitha was seen today for adhd.  Diagnoses and all orders for this visit:  Acute upper respiratory infection -     amoxicillin-clavulanate (AUGMENTIN) 875-125 MG tablet; Take 1 tablet by mouth 2 (two) times daily. Take for 10 days.  Takes daily multivitamins -     Prenat-FeBis-FePro-FA-CA-Omega (COMPLETE NATAL DHA) 29-1-200 & 250 MG MISC; Take one tablet daily.  Ulnar nerve entrapment, unspecified laterality -     traMADol (ULTRAM) 50 MG tablet; TAKE 1 TABLET EVERY 12 HOURS AS NEEDED  Attention deficit hyperactivity disorder (ADHD), predominantly inattentive type -     buPROPion (WELLBUTRIN XL) 300 MG 24 hr tablet; Take 1 tablet (300 mg total) by mouth daily. -     methylphenidate 54 MG PO CR tablet; Take 1 tablet (54 mg total) by mouth every morning.  Other orders -      Discontinue: methylphenidate 54 MG PO CR tablet; Take 1 tablet (54 mg total) by mouth every morning. -     Discontinue: methylphenidate 54 MG PO CR tablet; Take 1 tablet (54 mg total) by mouth every morning.   Added wellbutrin to see if would help with some of her inattention/focus issues. Also discussed she is going to make some mistakes and medication is not going to make her 100 percent all the time. I want to make sure she is realistic with her expectations.   Tramadol refilled. [place] controlled substance database reviewed with no concerns.   Treated with augmentin for URI. Symptomatic care discussed.

## 2016-09-16 ENCOUNTER — Telehealth: Payer: Self-pay | Admitting: Physician Assistant

## 2016-09-16 MED ORDER — PREDNISONE 50 MG PO TABS: ORAL_TABLET | ORAL | 0 refills | Status: DC

## 2016-09-16 NOTE — Telephone Encounter (Signed)
Pt continues to cough. She is taking tessalon pearls. She is taking abx. She would like something for cough. Will send over prednisone burst.

## 2016-10-06 ENCOUNTER — Other Ambulatory Visit: Payer: Self-pay

## 2016-10-06 DIAGNOSIS — F5102 Adjustment insomnia: Secondary | ICD-10-CM

## 2016-10-06 MED ORDER — ZOLPIDEM TARTRATE 10 MG PO TABS: 10.0000 mg | ORAL_TABLET | Freq: Every evening | ORAL | 0 refills | Status: DC | PRN

## 2016-10-07 ENCOUNTER — Other Ambulatory Visit: Payer: Self-pay

## 2016-10-07 MED ORDER — CYCLOBENZAPRINE HCL 10 MG PO TABS: ORAL_TABLET | ORAL | 1 refills | Status: DC

## 2016-10-24 ENCOUNTER — Ambulatory Visit: Payer: BLUE CROSS/BLUE SHIELD | Admitting: Physician Assistant

## 2016-10-24 ENCOUNTER — Encounter: Payer: Self-pay | Admitting: Physician Assistant

## 2016-10-24 ENCOUNTER — Other Ambulatory Visit: Payer: Self-pay | Admitting: Physician Assistant

## 2016-10-24 ENCOUNTER — Ambulatory Visit (INDEPENDENT_AMBULATORY_CARE_PROVIDER_SITE_OTHER): Payer: BLUE CROSS/BLUE SHIELD | Admitting: Physician Assistant

## 2016-10-24 VITALS — BP 107/70 | HR 91 | Wt 118.0 lb

## 2016-10-24 DIAGNOSIS — G8929 Other chronic pain: Secondary | ICD-10-CM | POA: Diagnosis not present

## 2016-10-24 DIAGNOSIS — F909 Attention-deficit hyperactivity disorder, unspecified type: Secondary | ICD-10-CM

## 2016-10-24 DIAGNOSIS — F411 Generalized anxiety disorder: Secondary | ICD-10-CM

## 2016-10-24 DIAGNOSIS — Z79891 Long term (current) use of opiate analgesic: Secondary | ICD-10-CM | POA: Insufficient documentation

## 2016-10-24 DIAGNOSIS — Z0289 Encounter for other administrative examinations: Secondary | ICD-10-CM

## 2016-10-24 DIAGNOSIS — G5620 Lesion of ulnar nerve, unspecified upper limb: Secondary | ICD-10-CM

## 2016-10-24 DIAGNOSIS — Z79899 Other long term (current) drug therapy: Secondary | ICD-10-CM | POA: Diagnosis not present

## 2016-10-24 MED ORDER — HYDROCODONE-ACETAMINOPHEN 5-325 MG PO TABS: ORAL_TABLET | ORAL | 0 refills | Status: DC

## 2016-10-24 MED ORDER — METHYLPHENIDATE HCL ER (OSM) 54 MG PO TBCR: 54.0000 mg | EXTENDED_RELEASE_TABLET | ORAL | 0 refills | Status: DC

## 2016-10-24 NOTE — Progress Notes (Signed)
HPI:                                                                Annette Cook is a 31 y.o. female who presents to [facility] today for ADHD follow-up / chronic pain management  Patient doing well on Wellbutrin XL 509 and Concerta 54mg  daily. Denies headache, palpitations, insomnia. Reports mood is "good."  She is also requesting refill of her Norco. She states she typically takes Meloxicam and Tramadol and reserves Norco for severe pain.   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 buPROPion (WELLBUTRIN XL) 300 MG 24 hr tablet Take 1 tablet (300 mg total) by mouth daily. 90 tablet 1  . clonazePAM (KLONOPIN) 1 MG tablet Take 1 tablet (1 mg total) by mouth 2 (two) times daily as needed for anxiety. 180 tablet 1  . cyclobenzaprine (FLEXERIL) 10 MG tablet TAKE 1/2 TO 1 TABLET BY  MOUTH EVERY 8 HOURS AS  NEEDED FOR MUSCLE SPASM 90 tablet 1  . fluticasone (FLONASE) 50 MCG/ACT nasal spray Place 2 sprays into both nostrils daily. 48 g 3  . HYDROcodone-acetaminophen (NORCO/VICODIN) 5-325 MG tablet Take one tablet as needed in the evening for bilateral subluxation of ulnar nerve. 30 tablet 0  . hydrOXYzine (VISTARIL) 25 MG capsule TAKE 1 CAPSULE BY MOUTH  EVERY NIGHT AT BEDTIME AS  NEEDED 90 capsule 1  . meloxicam (MOBIC) 15 MG tablet TAKE 1 TABLET EVERY DAY 30 tablet 1  . methylphenidate 54 MG PO CR tablet Take 1 tablet (54 mg total) by mouth every morning. 30 tablet 0  . mupirocin ointment (BACTROBAN) 2 % APPLY TO AFFECTED AREA(S)  TOPICALLY 3 TIMES DAILY 176 g 1  . pantoprazole  (PROTONIX) 20 MG tablet Take 1 tablet (20 mg total) by mouth 2 (two) times daily. 180 tablet 3  . Prenat-FeBis-FePro-FA-CA-Omega (COMPLETE NATAL DHA) 29-1-200 & 250 MG MISC Take one tablet daily. 90 each 4  . PROAIR HFA 108 (90 Base) MCG/ACT inhaler USE 2 PUFFS 15-30 MINUTES BEFORE EXERCISE. 8.5 Inhaler 2  . traMADol (ULTRAM) 50 MG tablet TAKE 1 TABLET EVERY 12 HOURS AS NEEDED 45 tablet 2  . valACYclovir (VALTREX) 500 MG tablet Take 1 tablet (500 mg total) by mouth at bedtime. 90 tablet 3  . zolpidem (AMBIEN) 10 MG tablet Take 1 tablet (10 mg total) by mouth at bedtime as needed for sleep. 90 tablet 0  . gabapentin (NEURONTIN) 300 MG capsule Take 1 capsule (300 mg total) by mouth 3 (three) times daily. 90 capsule 5   No current facility-administered medications for this visit.     Past Medical History:  Diagnosis Date  . ADHD (attention deficit hyperactivity disorder)   . Anxiety   . GERD (gastroesophageal reflux disease)   . Insomnia    Past Surgical History:  Procedure Laterality Date  . APPENDECTOMY    . TONSILLECTOMY AND ADENOIDECTOMY    .  WISDOM TOOTH EXTRACTION     Social History  Substance Use Topics  . Smoking status: Former Smoker    Packs/day: 1.00    Years: 7.00    Types: Cigarettes    Quit date: 10/22/2004  . Smokeless tobacco: Never Used  . Alcohol use Yes     Comment: socially   family history includes Hypertension in her mother; Stroke in her mother.  ROS: negative except as noted in the HPI  Medications: Current Outpatient Prescriptions  Medication Sig Dispense Refill  . buPROPion (WELLBUTRIN XL) 300 MG 24 hr tablet Take 1 tablet (300 mg total) by mouth daily. 90 tablet 1  . clonazePAM (KLONOPIN) 1 MG tablet Take 1 tablet (1 mg total) by mouth 2 (two) times daily as needed for anxiety. 180 tablet 1  . cyclobenzaprine (FLEXERIL) 10 MG tablet TAKE 1/2 TO 1 TABLET BY  MOUTH EVERY 8 HOURS AS  NEEDED FOR MUSCLE SPASM 90 tablet 1  . fluticasone (FLONASE) 50  MCG/ACT nasal spray Place 2 sprays into both nostrils daily. 48 g 3  . HYDROcodone-acetaminophen (NORCO/VICODIN) 5-325 MG tablet Take one tablet as needed in the evening for bilateral subluxation of ulnar nerve. 30 tablet 0  . hydrOXYzine (VISTARIL) 25 MG capsule TAKE 1 CAPSULE BY MOUTH  EVERY NIGHT AT BEDTIME AS  NEEDED 90 capsule 1  . meloxicam (MOBIC) 15 MG tablet TAKE 1 TABLET EVERY DAY 30 tablet 1  . methylphenidate 54 MG PO CR tablet Take 1 tablet (54 mg total) by mouth every morning. 30 tablet 0  . mupirocin ointment (BACTROBAN) 2 % APPLY TO AFFECTED AREA(S)  TOPICALLY 3 TIMES DAILY 176 g 1  . pantoprazole (PROTONIX) 20 MG tablet Take 1 tablet (20 mg total) by mouth 2 (two) times daily. 180 tablet 3  . Prenat-FeBis-FePro-FA-CA-Omega (COMPLETE NATAL DHA) 29-1-200 & 250 MG MISC Take one tablet daily. 90 each 4  . PROAIR HFA 108 (90 Base) MCG/ACT inhaler USE 2 PUFFS 15-30 MINUTES BEFORE EXERCISE. 8.5 Inhaler 2  . traMADol (ULTRAM) 50 MG tablet TAKE 1 TABLET EVERY 12 HOURS AS NEEDED 45 tablet 2  . valACYclovir (VALTREX) 500 MG tablet Take 1 tablet (500 mg total) by mouth at bedtime. 90 tablet 3  . zolpidem (AMBIEN) 10 MG tablet Take 1 tablet (10 mg total) by mouth at bedtime as needed for sleep. 90 tablet 0  . gabapentin (NEURONTIN) 300 MG capsule Take 1 capsule (300 mg total) by mouth 3 (three) times daily. 90 capsule 5   No current facility-administered medications for this visit.    No Known Allergies     Objective:  BP 107/70   Pulse 91   Wt 118 lb (53.5 kg)   BMI 22.30 kg/m  Gen: well-groomed, cooperative, not ill-appearing, no distress HEENT: normal conjunctiva, trachea midline Pulm: Normal work of breathing, normal phonation, clear to auscultation bilaterally CV: Normal rate, regular rhythm, s1 and s2 distinct, no murmurs, clicks or rubs  GI: abdomen soft, nondistended, nontender Neuro: alert and oriented x 3, EOM's intact, no tremor MSK: moving all extremities, normal gait  and station, no peripheral edema Skin: warm, dry, intact; no rashes or lesions on exposed skin, no cyanosis   No results found for this or any previous visit (from the past 72 hour(s)). No results found.      Opioid Risk Tool - 10/24/16 1550      Family History of Substance Abuse   Alcohol Positive Female   Illegal Drugs Negative   Rx Drugs  Negative     Personal History of Substance Abuse   Alcohol Negative   Illegal Drugs Negative   Rx Drugs Negative     Age   Age between 33-45 years  Yes     History of Preadolescent Sexual Abuse   History of Preadolescent Sexual Abuse Negative or Female     Psychological Disease   Psychological Disease Negative   Depression Positive     Total Score   Opioid Risk Tool Scoring 5   Opioid Risk Interpretation Moderate Risk      Assessment and Plan: 31 y.o. female with   1. Adult ADHD - 3 month supply given - methylphenidate 54 MG PO CR tablet; Take 1 tablet (54 mg total) by mouth every morning.  Dispense: 30 tablet; Refill: 0   2. Polypharmacy - patient on multiple CNS depressants, including Norco, Hydroxyzine, Flexeril, Klonopin, Tramadol and Ambien  3. Encounter for chronic pain management - completed opioid risk assessment: moderate risk - Drug Abuse Panel 10-50 No Conf, U - NCCSRS checked, last fill was 07/18/2016. No red flags - HYDROcodone-acetaminophen (NORCO/VICODIN) 5-325 MG tablet; Take one tablet as needed in the evening for bilateral subluxation of ulnar nerve.  Dispense: 30 tablet; Refill: 0  4. Opioid use agreement exists - Drug Abuse Panel 10-50 No Conf, U  5. Ulnar nerve entrapment, unspecified laterality - HYDROcodone-acetaminophen (NORCO/VICODIN) 5-325 MG tablet; Take one tablet as needed in the evening for bilateral subluxation of ulnar nerve.  Dispense: 30 tablet; Refill: 0   Patient education and anticipatory guidance given Patient agrees with treatment plan Follow-up in 3 months for ADHD/chronic pain  management or sooner as needed  Darlyne Russian PA-C

## 2016-10-29 LAB — DRUG ABUSE PANEL 10-50 NO CONF, U
AMPHETAMINES (1000 ng/mL SCRN): POSITIVE — AB
BARBITURATES: NEGATIVE
BENZODIAZEPINES: NEGATIVE
COCAINE METABOLITES: NEGATIVE
MARIJUANA MET (50 ng/mL SCRN): NEGATIVE
METHADONE: NEGATIVE
METHAQUALONE: NEGATIVE
OPIATES: NEGATIVE
PHENCYCLIDINE: NEGATIVE
PROPOXYPHENE: NEGATIVE

## 2016-11-08 ENCOUNTER — Other Ambulatory Visit: Payer: Self-pay | Admitting: Physician Assistant

## 2016-11-08 DIAGNOSIS — B009 Herpesviral infection, unspecified: Secondary | ICD-10-CM

## 2016-12-01 ENCOUNTER — Other Ambulatory Visit: Payer: Self-pay | Admitting: Physician Assistant

## 2016-12-01 DIAGNOSIS — G5620 Lesion of ulnar nerve, unspecified upper limb: Secondary | ICD-10-CM

## 2016-12-05 ENCOUNTER — Ambulatory Visit: Payer: BLUE CROSS/BLUE SHIELD

## 2017-01-21 ENCOUNTER — Other Ambulatory Visit: Payer: Self-pay | Admitting: Family Medicine

## 2017-01-21 DIAGNOSIS — F411 Generalized anxiety disorder: Secondary | ICD-10-CM

## 2017-01-22 ENCOUNTER — Other Ambulatory Visit: Payer: Self-pay

## 2017-01-22 DIAGNOSIS — F411 Generalized anxiety disorder: Secondary | ICD-10-CM

## 2017-01-22 MED ORDER — CLONAZEPAM 1 MG PO TABS: 1.0000 mg | ORAL_TABLET | Freq: Two times a day (BID) | ORAL | 0 refills | Status: DC | PRN

## 2017-01-27 ENCOUNTER — Other Ambulatory Visit: Payer: Self-pay | Admitting: Physician Assistant

## 2017-01-27 DIAGNOSIS — G5620 Lesion of ulnar nerve, unspecified upper limb: Secondary | ICD-10-CM

## 2017-02-06 ENCOUNTER — Telehealth: Payer: Self-pay | Admitting: Physician Assistant

## 2017-02-06 ENCOUNTER — Other Ambulatory Visit: Payer: Self-pay | Admitting: Physician Assistant

## 2017-02-06 DIAGNOSIS — F909 Attention-deficit hyperactivity disorder, unspecified type: Secondary | ICD-10-CM

## 2017-02-06 MED ORDER — METHYLPHENIDATE HCL ER (OSM) 54 MG PO TBCR: 54.0000 mg | EXTENDED_RELEASE_TABLET | ORAL | 0 refills | Status: DC

## 2017-02-06 NOTE — Telephone Encounter (Signed)
Pt advised.

## 2017-02-06 NOTE — Progress Notes (Signed)
Pt needs a month to get to her next appt due to job and scheduling issues.

## 2017-02-06 NOTE — Telephone Encounter (Signed)
Patient called requested to get an appointment for Nov 9th for her and son Annette Cook and Annette Cook of the two remaining openings for that day because you are not working that Friday afternoon and then her next day off is Nov 30th and you are not working that day. And she only gets off every 3rd Friday. Pt needs Adderall med refilled. Pt also request to know if you will see her cousin as a patient her bp is high runs normal on medication and would like to know if you prescribe her Add med. I adv will send a message. Please adv thanks

## 2017-02-06 NOTE — Telephone Encounter (Signed)
Printed for one month. Make appt in December.   Yes I will see cousin.

## 2017-03-02 ENCOUNTER — Other Ambulatory Visit: Payer: Self-pay | Admitting: *Deleted

## 2017-03-02 DIAGNOSIS — G5620 Lesion of ulnar nerve, unspecified upper limb: Secondary | ICD-10-CM

## 2017-03-02 MED ORDER — GABAPENTIN 300 MG PO CAPS: 300.0000 mg | ORAL_CAPSULE | Freq: Three times a day (TID) | ORAL | 1 refills | Status: DC

## 2017-03-08 ENCOUNTER — Other Ambulatory Visit: Payer: Self-pay | Admitting: Physician Assistant

## 2017-03-08 DIAGNOSIS — F900 Attention-deficit hyperactivity disorder, predominantly inattentive type: Secondary | ICD-10-CM

## 2017-04-03 ENCOUNTER — Ambulatory Visit: Payer: BLUE CROSS/BLUE SHIELD | Admitting: Physician Assistant

## 2017-04-10 ENCOUNTER — Ambulatory Visit (INDEPENDENT_AMBULATORY_CARE_PROVIDER_SITE_OTHER): Payer: BLUE CROSS/BLUE SHIELD | Admitting: Physician Assistant

## 2017-04-10 ENCOUNTER — Encounter: Payer: Self-pay | Admitting: Physician Assistant

## 2017-04-10 VITALS — BP 105/67 | HR 96 | Temp 97.9°F | Resp 16 | Ht 61.0 in | Wt 116.0 lb

## 2017-04-10 DIAGNOSIS — F909 Attention-deficit hyperactivity disorder, unspecified type: Secondary | ICD-10-CM | POA: Diagnosis not present

## 2017-04-10 DIAGNOSIS — F411 Generalized anxiety disorder: Secondary | ICD-10-CM | POA: Diagnosis not present

## 2017-04-10 MED ORDER — CLONAZEPAM 1 MG PO TABS: 1.0000 mg | ORAL_TABLET | Freq: Two times a day (BID) | ORAL | 1 refills | Status: DC | PRN

## 2017-04-10 MED ORDER — HYDROXYZINE PAMOATE 25 MG PO CAPS: ORAL_CAPSULE | ORAL | 1 refills | Status: DC

## 2017-04-10 MED ORDER — METHYLPHENIDATE HCL ER (OSM) 54 MG PO TBCR: 54.0000 mg | EXTENDED_RELEASE_TABLET | ORAL | 0 refills | Status: DC

## 2017-04-10 NOTE — Progress Notes (Signed)
Subjective:    Patient ID: Annette Cook, female    DOB: June 25, 1985, 31 y.o.   MRN: [id number]  HPI Patient is a 31 year old female who presents to the clinic for 68-month follow-up on ADHD.  She is currently on Concerta and doing great.  She denies any unusual insomnia or anxiety.  She does have both that she has had before she started Concerta.  She has been using Klonopin almost every night when she gets off work.  She admits she is also using Ambien before bedtime.  She denies any side effects or concerns. Overall during the day she is doing well at work and getting things accomplished.   .. Active Ambulatory Problems    Diagnosis Date Noted  . Herpes 12/02/2011  . Generalized anxiety disorder 10/22/2012  . Insomnia 10/22/2012  . Panic attack 10/22/2012  . ADHD (attention deficit hyperactivity disorder) 10/22/2012  . 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 Anxiety and depression 09/03/2015  . Takes daily multivitamins 09/12/2016  . 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 Opioid use agreement exists 10/24/2016  . Encounter for chronic pain management 10/24/2016  . Adult ADHD 04/10/2017   Resolved Ambulatory Problems    Diagnosis Date Noted  . Supervision of normal IUP (intrauterine pregnancy) in multigravida 11/09/2011  . Rh negative status during pregnancy 02/03/2012  . Marginal placenta previa 02/03/2012  . Pain in throat 04/18/2013  . Acute stress reaction 07/08/2013  . Encounter for supervision of other normal pregnancy in first trimester 12/07/2013  . Anxiety during pregnancy in second trimester, antepartum 01/13/2014  . Active labor 06/01/2014  . Vaginal delivery 06/01/2014  . Shoulder dystocia, delivered, current hospitalization 06/01/2014  . Bilateral hand numbness 01/16/2015  . Cough 02/21/2015  . Cervical radiculopathy at C8 02/27/2015  . Otitis, externa, infective 03/15/2015  . Polypharmacy  10/24/2016   Past Medical History:  Diagnosis Date  . ADHD (attention deficit hyperactivity disorder)   . Anxiety   . GERD (gastroesophageal reflux disease)   . Insomnia       Review of Systems  All other systems reviewed and are negative.      Objective:   Physical Exam  Constitutional: She is oriented to person, place, and time. She appears well-developed and well-nourished.  HENT:  Head: Normocephalic and atraumatic.  Cardiovascular: Normal rate, regular rhythm and normal heart sounds.  Pulmonary/Chest: Effort normal and breath sounds normal.  Neurological: She is alert and oriented to person, place, and time.  Psychiatric: She has a normal mood and affect. Her behavior is normal.          Assessment & Plan:  Marland KitchenMarland KitchenLaquandra was seen today for follow-up.  Diagnoses and all orders for this visit:  Adult ADHD -     methylphenidate 54 MG PO CR tablet; Take 1 tablet (54 mg total) by mouth every morning.  Generalized anxiety disorder -     clonazePAM (KLONOPIN) 1 MG tablet; Take 1 tablet (1 mg total) by mouth 2 (two) times daily as needed for anxiety. -     hydrOXYzine (VISTARIL) 25 MG capsule; TAKE 1 CAPSULE BY MOUTH  EVERY NIGHT AT BEDTIME AS  NEEDED   Since Concerta refill over.  Follow-up in 3 months.  She will call in monthly for future refills.  Discussed the use of Klonopin daily.  I would like for her to try to space this out and only use as needed. Discussed otherwise to decrease anxiety  such as meditation, essential oils, exercise.  I did give her some Vistaril to see if could take the anxiety edge off when she gets home from work.  I would like for her to use this more frequently than Klonopin.  Encourage good sleep hygiene.

## 2017-04-17 ENCOUNTER — Other Ambulatory Visit: Payer: Self-pay | Admitting: Physician Assistant

## 2017-05-13 ENCOUNTER — Telehealth: Payer: Self-pay

## 2017-05-13 ENCOUNTER — Ambulatory Visit (INDEPENDENT_AMBULATORY_CARE_PROVIDER_SITE_OTHER): Payer: BLUE CROSS/BLUE SHIELD | Admitting: Sports Medicine

## 2017-05-13 ENCOUNTER — Encounter: Payer: Self-pay | Admitting: Sports Medicine

## 2017-05-13 VITALS — BP 132/87 | HR 118 | Temp 97.8°F | Wt 116.0 lb

## 2017-05-13 DIAGNOSIS — R69 Illness, unspecified: Secondary | ICD-10-CM | POA: Diagnosis not present

## 2017-05-13 DIAGNOSIS — J111 Influenza due to unidentified influenza virus with other respiratory manifestations: Secondary | ICD-10-CM | POA: Insufficient documentation

## 2017-05-13 DIAGNOSIS — R6889 Other general symptoms and signs: Secondary | ICD-10-CM

## 2017-05-13 LAB — POCT INFLUENZA A/B
Influenza A, POC: NEGATIVE
Influenza B, POC: NEGATIVE

## 2017-05-13 MED ORDER — OSELTAMIVIR PHOSPHATE 75 MG PO CAPS: 75.0000 mg | ORAL_CAPSULE | Freq: Every day | ORAL | 0 refills | Status: DC

## 2017-05-13 MED ORDER — HYDROCOD POLST-CPM POLST ER 10-8 MG/5ML PO SUER: 5.0000 mL | Freq: Two times a day (BID) | ORAL | 0 refills | Status: DC | PRN

## 2017-05-13 NOTE — Assessment & Plan Note (Signed)
Outside the window for Tamiflu, she will use meloxicam, adding Tussionex. I am going to prophylax her children. I am also going to send her in prophylactic dose Tamiflu the so that her husband can use it. Out of work until Monday. She did get a flu shot this year.

## 2017-05-13 NOTE — Progress Notes (Signed)
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  No skin rash, no GI symptoms.  Multiple sick contacts.  Symptoms are moderate, persistent.  Her husband and 3 children have not yet developed symptoms, but they certainly have close exposures to her.  I reviewed the past medical history, family history, social history, surgical history, and allergies today and no changes were needed.  Please see the problem list section below in epic for further details.  Past Medical History: Past Medical History:  Diagnosis Date  . ADHD (attention deficit hyperactivity disorder)   . Anxiety   . GERD (gastroesophageal reflux disease)   . Insomnia    Past Surgical History: Past Surgical History:  Procedure Laterality Date  . APPENDECTOMY    . TONSILLECTOMY AND ADENOIDECTOMY    . WISDOM TOOTH EXTRACTION     Social History: Social History   Socioeconomic History  . Marital status: Married    Spouse name: None  . Number of children: None  . Years of education: None  . Highest education level: None  Social Needs  . Financial resource strain: None  . Food insecurity - worry: None  . Food insecurity - inability: None  . Transportation needs - medical: None  . Transportation needs - non-medical: None  Occupational History  . Occupation: Network engineer  Tobacco Use  . Smoking status: Former Smoker    Packs/day: 1.00    Years: 7.00    Pack years: 7.00    Types: Cigarettes    Last attempt to quit: 10/22/2004    Years since quitting: 12.5  . Smokeless tobacco: Never Used  Substance and Sexual Activity  . Alcohol use: Yes    Comment: socially  . Drug use: No  . Sexual activity: Yes    Partners: Male  Other Topics Concern  . None  Social History Narrative   Marital Status: Married Barrister's clerk)    Children:  Son Nurse, learning disability) Daughter (Remi)    Pets: None    Living Situation: Lives with husband, step-daughter & children.    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      Alcohol Use:  None   Drug Use:  None   Diet:  Regular   Exercise:  None   Hobbies: Fishing ([place])          Family History: Family History  Problem Relation Age of Onset  . Hypertension Mother   . Stroke Mother    Allergies: No Known Allergies Medications: See med rec.  Review of Systems: No fevers, chills, night sweats, weight loss, chest pain, or shortness of breath.   Objective:    General: Well Developed, well nourished, appears ill.  Neuro: Alert and oriented x3, extra-ocular muscles intact, sensation grossly intact.  HEENT: Normocephalic, atraumatic, pupils equal round reactive to light, neck supple, no masses, no lymphadenopathy, thyroid nonpalpable.  Oropharynx, nasopharynx and ear canals unremarkable Skin: Warm and dry, no rashes. Cardiac: Regular rate and rhythm, no murmurs rubs or gallops, no lower extremity edema.  Respiratory: Clear to auscultation bilaterally. Not using accessory muscles, speaking in full sentences.  Rapid flu test is negative  Impression and Recommendations:    Influenza-like illness Outside the window  for Tamiflu, she will use meloxicam, adding Tussionex. I am going to prophylax her children. I am also going to send her in prophylactic dose Tamiflu the so that her husband can use it. Out of work until Monday. She did get a flu shot this year.  I spent 25 minutes with this patient, greater than 50% was face-to-face time counseling regarding the above diagnoses ___________________________________________ Gwen Her. Dianah Field, M.D., ABFM., CAQSM. Primary Care and [place] Instructor of  [place] [facility]

## 2017-05-13 NOTE — Telephone Encounter (Signed)
Patient called stated that she was seen in the office today and was written out of work until Monday.She is requesting that the letter be changed to return back to work on Friday if she is feeling better.please advise.Glynis Hunsucker,CMA

## 2017-05-14 ENCOUNTER — Encounter: Payer: Self-pay | Admitting: Sports Medicine

## 2017-05-14 ENCOUNTER — Telehealth: Payer: Self-pay

## 2017-05-14 MED ORDER — PREDNISONE 50 MG PO TABS: ORAL_TABLET | ORAL | 0 refills | Status: DC

## 2017-05-14 NOTE — Telephone Encounter (Signed)
What in the baby jeebus!?!   Lets try this again.

## 2017-05-14 NOTE — Telephone Encounter (Signed)
Annette Cook complains of a worsening of cough, body aches and sore throat. She would like a prescription for prednisone.

## 2017-05-14 NOTE — Telephone Encounter (Signed)
I don't see the prednisone on the list.

## 2017-05-14 NOTE — Telephone Encounter (Signed)
Thanks

## 2017-05-14 NOTE — Telephone Encounter (Signed)
Left message on patient vm advising that letter is here at the office for pickup. Rhonda Cunningham,CMA

## 2017-05-14 NOTE — Telephone Encounter (Signed)
50 mg prednisone burst sent in.

## 2017-05-14 NOTE — Telephone Encounter (Signed)
Letter is in my box 

## 2017-05-19 ENCOUNTER — Other Ambulatory Visit: Payer: Self-pay

## 2017-05-19 DIAGNOSIS — F909 Attention-deficit hyperactivity disorder, unspecified type: Secondary | ICD-10-CM

## 2017-05-19 MED ORDER — METHYLPHENIDATE HCL ER (OSM) 54 MG PO TBCR: 54.0000 mg | EXTENDED_RELEASE_TABLET | ORAL | 0 refills | Status: DC

## 2017-06-02 ENCOUNTER — Other Ambulatory Visit: Payer: Self-pay | Admitting: *Deleted

## 2017-06-02 DIAGNOSIS — F900 Attention-deficit hyperactivity disorder, predominantly inattentive type: Secondary | ICD-10-CM

## 2017-06-02 MED ORDER — BUPROPION HCL ER (XL) 300 MG PO TB24: 300.0000 mg | ORAL_TABLET | Freq: Every day | ORAL | 0 refills | Status: DC

## 2017-06-12 ENCOUNTER — Other Ambulatory Visit: Payer: Self-pay | Admitting: Physician Assistant

## 2017-06-12 DIAGNOSIS — G5620 Lesion of ulnar nerve, unspecified upper limb: Secondary | ICD-10-CM

## 2017-06-26 ENCOUNTER — Ambulatory Visit: Payer: BLUE CROSS/BLUE SHIELD | Admitting: Physician Assistant

## 2017-07-08 ENCOUNTER — Telehealth: Payer: Self-pay

## 2017-07-08 DIAGNOSIS — G8929 Other chronic pain: Secondary | ICD-10-CM

## 2017-07-08 DIAGNOSIS — G5620 Lesion of ulnar nerve, unspecified upper limb: Secondary | ICD-10-CM

## 2017-07-08 MED ORDER — HYDROCODONE-ACETAMINOPHEN 5-325 MG PO TABS: ORAL_TABLET | ORAL | 0 refills | Status: DC

## 2017-07-08 NOTE — Telephone Encounter (Signed)
Annette Cook states she is having right ulnar pain. She states the tramadol is not helping. Please advise.

## 2017-07-08 NOTE — Telephone Encounter (Signed)
Hx of ulnar nerve entrapment. She was horse playing with her kids and woke up in pain. Tramadol is not helping. She ran out of hydrocodone. Sent 20 tablet refill. Follow up with ortho on Monday if not better.

## 2017-07-12 ENCOUNTER — Other Ambulatory Visit: Payer: Self-pay | Admitting: Physician Assistant

## 2017-07-15 ENCOUNTER — Other Ambulatory Visit: Payer: Self-pay | Admitting: Physician Assistant

## 2017-07-15 DIAGNOSIS — G5620 Lesion of ulnar nerve, unspecified upper limb: Secondary | ICD-10-CM

## 2017-07-17 ENCOUNTER — Telehealth: Payer: Self-pay

## 2017-07-17 NOTE — Telephone Encounter (Signed)
Patient called stated that she is having elbow pain again even after having surgery a year ago. Patient wants to know what else is there that she can do for her elbow pain. Patient was advised that it would be hard to evaluate her over the phone considering she hasnt been seen by Dr. In 2 years. Patient was transferred up front to scheduling to schedule an appointment for 07/22/2016 @ 1:40 pm. Because that's the only day she can get in. Rhonda Cunningham,CMA

## 2017-07-20 ENCOUNTER — Ambulatory Visit (INDEPENDENT_AMBULATORY_CARE_PROVIDER_SITE_OTHER): Payer: Managed Care, Other (non HMO) | Admitting: Family Medicine

## 2017-07-20 ENCOUNTER — Encounter: Payer: Self-pay | Admitting: Family Medicine

## 2017-07-20 VITALS — BP 117/77 | HR 109 | Ht 61.0 in | Wt 115.0 lb

## 2017-07-20 DIAGNOSIS — M25521 Pain in right elbow: Secondary | ICD-10-CM

## 2017-07-20 MED ORDER — DICLOFENAC SODIUM 1 % TD GEL: 2.0000 g | Freq: Four times a day (QID) | TRANSDERMAL | 11 refills | Status: DC

## 2017-07-20 MED ORDER — PREDNISONE 5 MG (48) PO TBPK: ORAL_TABLET | ORAL | 0 refills | Status: DC

## 2017-07-20 NOTE — Patient Instructions (Addendum)
Thank you for coming in today. Apply the gel up to 4x daily for pain to elbow.  Take the prednisone.  Attend Hand Therapy if not better after the steroids.  If not better in about 6 weeks return  Let me know if you are getting worse.

## 2017-07-20 NOTE — Progress Notes (Signed)
Annette Cook is a 32 y.o. female who presents to [facility] today for elbow pain.   Annette Cook states she was roughhousing with one of her children a 2 weeks ago when she began having right elbow pain. She states the pain starts in her elbow and radiates dorsum the back of her forearm and hand. She states the pain is worse when carrying groceries or picking up a child. She denies other symptoms. She states she has full range of motion in spite of the pain elicited by certain movements. The pain is constant and worsens with movement. She is taking gabapentin and hydrocodone with minimal relief. She endorses some mild weakness especially twisting and turning activities.  Her medical history is pertinent for a right arm ulnar nerve transposition following failure of conservative management to address right cubital tunnel syndrome.   Past Medical History:  Diagnosis Date  . ADHD (attention deficit hyperactivity disorder)   . Anxiety   . GERD (gastroesophageal reflux disease)   . Insomnia    Past Surgical History:  Procedure Laterality Date  . APPENDECTOMY    . TONSILLECTOMY AND ADENOIDECTOMY    . WISDOM TOOTH EXTRACTION     Social History   Tobacco Use  . Smoking status: Former Smoker    Packs/day: 1.00    Years: 7.00    Pack years: 7.00    Types: Cigarettes    Last attempt to quit: 10/22/2004    Years since quitting: 12.7  . Smokeless tobacco: Never Used  Substance Use Topics  . Alcohol use: Yes    Comment: socially   family history includes Hypertension in her mother; Stroke in her mother.  ROS as above:  Medications: Current Outpatient Medications  Medication Sig Dispense Refill  . buPROPion (WELLBUTRIN XL) 300 MG 24 hr tablet Take 1 tablet (300 mg total) by mouth daily. 90 tablet 0  . clonazePAM (KLONOPIN) 1 MG tablet Take 1 tablet (1 mg total) by  mouth 2 (two) times daily as needed for anxiety. 60 tablet 1  . cyclobenzaprine (FLEXERIL) 10 MG tablet TAKE 1/2 TO 1 TABLET BY  MOUTH EVERY 8 HOURS AS  NEEDED FOR MUSCLE SPASM 90 tablet 1  . fluticasone (FLONASE) 50 MCG/ACT nasal spray Place 2 sprays into both nostrils daily. 48 g 3  . gabapentin (NEURONTIN) 300 MG capsule Take 1 capsule (300 mg total) 3 (three) times daily by mouth. 270 capsule 1  . HYDROcodone-acetaminophen (NORCO/VICODIN) 5-325 MG tablet Take one tablet every 6 hours or as needed in the evening for bilateral subluxation of ulnar nerve 20 tablet 0  . hydrOXYzine (VISTARIL) 25 MG capsule TAKE 1 CAPSULE BY MOUTH  EVERY NIGHT AT BEDTIME AS  NEEDED 90 capsule 1  . meloxicam (MOBIC) 15 MG tablet TAKE 1 TABLET EVERY DAY 30 tablet 1  . methylphenidate 54 MG PO CR tablet Take 1 tablet (54 mg total) by mouth every morning. 30 tablet 0  . methylphenidate 54 MG PO CR tablet Take 1 tablet (54 mg total) by mouth every morning. Ok to fill on 06/16/17 or after. 30 tablet 0  . mupirocin ointment (BACTROBAN) 2 % APPLY TO AFFECTED AREA(S)  TOPICALLY 3 TIMES DAILY 176 g 1  . pantoprazole (PROTONIX) 20 MG tablet TAKE 1 TABLET (20 MG TOTAL) BY MOUTH 2 (TWO) TIMES DAILY. NEEDS APPOINTMENT FOR FUTURE REFILLS. 60 tablet 0  . Prenat-FeBis-FePro-FA-CA-Omega (COMPLETE NATAL DHA) 29-1-200 & 250 MG MISC  Take one tablet daily. 90 each 4  . PROAIR HFA 108 (90 Base) MCG/ACT inhaler USE 2 PUFFS 15-30 MINUTES BEFORE EXERCISE. 8.5 Inhaler 2  . traMADol (ULTRAM) 50 MG tablet TAKE 1 TABLET EVERY 12 HOURS AS NEEDED 45 tablet 0  . valACYclovir (VALTREX) 500 MG tablet TAKE 1 TABLET AT BEDTIME 90 tablet 2  . zolpidem (AMBIEN) 10 MG tablet Take 1 tablet (10 mg total) by mouth at bedtime as needed for sleep. 90 tablet 0  . diclofenac sodium (VOLTAREN) 1 % GEL Apply 2 g topically 4 (four) times daily. To affected joint. 100 g 11  . predniSONE (STERAPRED UNI-PAK 48 TAB) 5 MG (48) TBPK tablet 12 day dosepack po 48 tablet 0    No current facility-administered medications for this visit.    No Known Allergies  Health Maintenance Health Maintenance  Topic Date Due  . PAP SMEAR  07/13/2017  . INFLUENZA VACCINE  11/12/2017  . TETANUS/TDAP  03/17/2024  . HIV Screening  Completed     Exam:  BP 117/77   Pulse (!) 109   Ht 5\' 1"  (1.549 m)   Wt 115 lb (52.2 kg)   BMI 21.73 kg/m  Gen: Well NAD HEENT: EOMI,  MMM Lungs: Normal work of breathing. CTABL Heart: RRR no MRG Abd: NABS, Soft. Nondistended, Nontender Exts: Brisk capillary refill, warm and well perfused.   MSK:   Right arm:  No erythema, obvious deformities or evidence of effusions. Pain elicited on dorsal elbow with elbow flexion, and forearm pronation.  Ligaments are intact.  Strength is 5/5 to hand grip strength finger abduction adduction and extension Pain with finger extension against resistance.   C-spine nontender to spinal midline normal neck motion.  Negative Spurling's test.  No results found for this or any previous visit (from the past 72 hour(s)). No results found.    Assessment and Plan: 32 y.o. female with elbow pain.   Status post ulnar nerve transposition.  Over her pain syndrome is more concerning for the sensory side of the radial nerve.  She does not have significant weakness on exam.   Patient should continue taking gabapentin to help with nerve pain. I am prescribing prednisone to help with the inflammation as well. Additionally, the patient will be referred to hand therapy, which should help with her chronic arm/hand pain as well as this acute episode.   Recheck in about 4-6 weeks.  Return sooner if needed.   Orders Placed This Encounter  Procedures  . Ambulatory referral to Occupational Therapy    Referral Priority:   Routine    Referral Type:   Occupational Therapy    Referral Reason:   Specialty Services Required    Requested Specialty:   Occupational Therapy    Number of Visits Requested:   1   Meds  ordered this encounter  Medications  . predniSONE (STERAPRED UNI-PAK 48 TAB) 5 MG (48) TBPK tablet    Sig: 12 day dosepack po    Dispense:  48 tablet    Refill:  0  . diclofenac sodium (VOLTAREN) 1 % GEL    Sig: Apply 2 g topically 4 (four) times daily. To affected joint.    Dispense:  100 g    Refill:  11     Discussed warning signs or symptoms. Please see discharge instructions. Patient expresses understanding.

## 2017-07-22 ENCOUNTER — Ambulatory Visit (INDEPENDENT_AMBULATORY_CARE_PROVIDER_SITE_OTHER): Payer: Managed Care, Other (non HMO) | Admitting: Physician Assistant

## 2017-07-22 ENCOUNTER — Encounter: Payer: BLUE CROSS/BLUE SHIELD | Admitting: Family Medicine

## 2017-07-22 ENCOUNTER — Encounter: Payer: Self-pay | Admitting: Physician Assistant

## 2017-07-22 VITALS — BP 111/63 | HR 84 | Ht 61.0 in | Wt 114.0 lb

## 2017-07-22 DIAGNOSIS — F411 Generalized anxiety disorder: Secondary | ICD-10-CM | POA: Diagnosis not present

## 2017-07-22 DIAGNOSIS — F900 Attention-deficit hyperactivity disorder, predominantly inattentive type: Secondary | ICD-10-CM

## 2017-07-22 MED ORDER — BUPROPION HCL ER (XL) 150 MG PO TB24: 150.0000 mg | ORAL_TABLET | ORAL | 0 refills | Status: DC

## 2017-07-22 MED ORDER — LISDEXAMFETAMINE DIMESYLATE 40 MG PO CAPS: 40.0000 mg | ORAL_CAPSULE | ORAL | 0 refills | Status: DC

## 2017-07-22 MED ORDER — ESCITALOPRAM OXALATE 10 MG PO TABS: 10.0000 mg | ORAL_TABLET | Freq: Every day | ORAL | 2 refills | Status: DC

## 2017-07-22 NOTE — Progress Notes (Deleted)
   Subjective:    Patient ID: Annette Cook, female    DOB: Mar 23, 1986, 32 y.o.   MRN: [id number]  HPI  Pay decrease but doing better.   Just around period. Heating pad in scrub     Review of Systems     Objective:   Physical Exam        Assessment & Plan:

## 2017-07-22 NOTE — Telephone Encounter (Signed)
Patient came in for appointment. Sharniece Gibbon,CMA

## 2017-07-22 NOTE — Progress Notes (Signed)
Subjective:    Patient ID: Annette Cook, female    DOB: [birth year], 32 y.o.   MRN: [id number]  HPI Jermany presents today regarding her ADHD. She states that she has noticed some improvement on the concerta, but still does not feel like she is really focusing as much as she needs too. Adderall was previously tried but did not seem to work at all. She states that she had a job change, that is ultimately less stressful, but she is in charge of a lot more than she was and needs more help with that concentration. She denies any insomnia, appetite changes. She also has some questions if her hormones could be playing a role in her moodiness.She states (and child who is present during visit) state that she is a little bit more moody. She does not think this is directly related to her periods. She is not taking any birth control and does not want to start anything at this visit.  .. Active Ambulatory Problems    Diagnosis Date Noted  . Herpes 12/02/2011  . Generalized anxiety disorder 10/22/2012  . Insomnia 10/22/2012  . Panic attack 10/22/2012  . ADHD (attention deficit hyperactivity disorder) 10/22/2012  . 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 Anxiety and depression 09/03/2015  . Takes daily multivitamins 09/12/2016  . 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 Opioid use agreement exists 10/24/2016  . Encounter for chronic pain management 10/24/2016  . Adult ADHD 04/10/2017  . Influenza-like illness 05/13/2017   Resolved Ambulatory Problems    Diagnosis Date Noted  . Supervision of normal IUP (intrauterine pregnancy) in multigravida 11/09/2011  . Rh negative status during pregnancy 02/03/2012  . Marginal placenta previa 02/03/2012  . Pain in throat 04/18/2013  . Acute stress reaction 07/08/2013  . Encounter for supervision of other normal pregnancy in first trimester 12/07/2013  . Anxiety during pregnancy in second  trimester, antepartum 01/13/2014  . Active labor 06/01/2014  . Vaginal delivery 06/01/2014  . Shoulder dystocia, delivered, current hospitalization 06/01/2014  . Bilateral hand numbness 01/16/2015  . Cough 02/21/2015  . Cervical radiculopathy at C8 02/27/2015  . Otitis, externa, infective 03/15/2015  . Polypharmacy 10/24/2016   Past Medical History:  Diagnosis Date  . ADHD (attention deficit hyperactivity disorder)   . Anxiety   . GERD (gastroesophageal reflux disease)   . Insomnia     Review of Systems  Constitutional: Negative for activity change and appetite change.  Respiratory: Negative for cough, choking and chest tightness.   Cardiovascular: Negative for chest pain and palpitations.  Gastrointestinal: Negative for abdominal pain and vomiting.  Musculoskeletal: Negative for myalgias.  Neurological: Negative for dizziness and headaches.  Psychiatric/Behavioral: Negative for sleep disturbance.       Objective:   Physical Exam  Constitutional: She is oriented to person, place, and time. She appears well-developed and well-nourished. No distress.  HENT:  Head: Normocephalic and atraumatic.  Right Ear: External ear normal.  Left Ear: External ear normal.  Eyes: Conjunctivae are normal.  Neck: Normal range of motion. Neck supple.  Cardiovascular: Normal rate, regular rhythm, normal heart sounds and intact distal pulses.  Pulmonary/Chest: Effort normal and breath sounds normal. No respiratory distress. She has no wheezes.  Neurological: She is alert and oriented to person, place, and time.  Skin: Skin is warm and dry.  Psychiatric: She has a normal mood and affect. Her behavior is normal.  Nursing note and vitals reviewed.  Assessment & Plan:  Marland KitchenMarland KitchenShanetha was seen today for adhd.  Diagnoses and all orders for this visit:  Attention deficit hyperactivity disorder (ADHD), predominantly inattentive type -     lisdexamfetamine (VYVANSE) 40 MG capsule; Take 1  capsule (40 mg total) by mouth every morning. -     buPROPion (WELLBUTRIN XL) 150 MG 24 hr tablet; Take 1 tablet (150 mg total) by mouth every morning.  Generalized anxiety disorder -     buPROPion (WELLBUTRIN XL) 150 MG 24 hr tablet; Take 1 tablet (150 mg total) by mouth every morning. -     escitalopram (LEXAPRO) 10 MG tablet; Take 1 tablet (10 mg total) by mouth daily.   .. Depression screen [facility] 2/9 07/22/2017 08/01/2016 02/11/2013  Decreased Interest 1 0 0  Down, Depressed, Hopeless 1 0 1  PHQ - 2 Score 2 0 1  Altered sleeping 2 - -  Tired, decreased energy 0 - -  Change in appetite 0 - -  Feeling bad or failure about yourself  1 - -  Trouble concentrating 0 - -  Moving slowly or fidgety/restless 0 - -  PHQ-9 Score 5 - -  Difficult doing work/chores Not difficult at all - -   .[name] GAD 7 : Generalized Anxiety Score 07/22/2017  Nervous, Anxious, on Edge 1  Control/stop worrying 0  Worry too much - different things 0  Trouble relaxing 1  Restless 1  Easily annoyed or irritable 1  Afraid - awful might happen 0  Total GAD 7 Score 4  Anxiety Difficulty Not difficult at all    Tried and failed concerta and adderall. Will try vyvanse. Follow up in 1 month.   Added lexapro to wellbutrin. Discussed side effects. Follow up in 1 month.   Overall lets see if this combination helps focus and mood more.

## 2017-07-23 ENCOUNTER — Telehealth: Payer: Self-pay | Admitting: Physician Assistant

## 2017-07-23 NOTE — Telephone Encounter (Signed)
Received fax from [employer] that Vyvanse requires a PA. Information has been sent to the insurance company. Awaiting determination.   

## 2017-08-04 ENCOUNTER — Other Ambulatory Visit: Payer: Self-pay | Admitting: Sports Medicine

## 2017-08-04 DIAGNOSIS — B009 Herpesviral infection, unspecified: Secondary | ICD-10-CM

## 2017-08-04 NOTE — Telephone Encounter (Signed)
Received letter from [facility] that additional information was needed for a determination. Last office note faxed 08/04/2017.

## 2017-08-10 ENCOUNTER — Other Ambulatory Visit: Payer: Self-pay | Admitting: Physician Assistant

## 2017-08-10 ENCOUNTER — Telehealth: Payer: Self-pay | Admitting: Physician Assistant

## 2017-08-10 MED ORDER — DEXMETHYLPHENIDATE HCL ER 20 MG PO CP24: 20.0000 mg | ORAL_CAPSULE | Freq: Every day | ORAL | 0 refills | Status: DC

## 2017-08-10 NOTE — Telephone Encounter (Signed)
Pt called upset because shes been trying to get her ADHD med and now they tell her it needs a peer to peer. She would also like a refill of her [facility]se sent to [facility]. Patient also states she does not want to try Ritalin

## 2017-08-10 NOTE — Telephone Encounter (Signed)
Sent focalin XR at request of insurance company.

## 2017-08-10 NOTE — Progress Notes (Signed)
Will not approve. Vyvanse until tried focalin XR. Sent today one month.

## 2017-08-10 NOTE — Telephone Encounter (Signed)
Yes I sent in focalin XR to try.

## 2017-08-10 NOTE — Telephone Encounter (Signed)
Left VM with update.  

## 2017-08-10 NOTE — Telephone Encounter (Signed)
Pt has been contacted by PCP.

## 2017-08-10 NOTE — Telephone Encounter (Signed)
Received a denial letter from [employer] that patient will need to try and fail Focalin before we can try to get Vyvanse approved. I spoke with Manuela Schwartz at [facility] this was the advice she gave me as of 08/10/2017. Please send in Foclin, Patient is concerned about taking this medicine due to the side effects. She also stated that she is going to "make up side effects so she can get on the Vyvanse".

## 2017-08-12 ENCOUNTER — Encounter: Payer: Self-pay | Admitting: Physician Assistant

## 2017-08-14 ENCOUNTER — Other Ambulatory Visit: Payer: Self-pay | Admitting: Family Medicine

## 2017-08-14 ENCOUNTER — Other Ambulatory Visit: Payer: Self-pay | Admitting: Physician Assistant

## 2017-08-14 ENCOUNTER — Telehealth: Payer: Self-pay | Admitting: Physician Assistant

## 2017-08-14 DIAGNOSIS — G5620 Lesion of ulnar nerve, unspecified upper limb: Secondary | ICD-10-CM

## 2017-08-14 DIAGNOSIS — F5102 Adjustment insomnia: Secondary | ICD-10-CM

## 2017-08-14 NOTE — Telephone Encounter (Signed)
Pt called and states that her new ADHD medicine Focalin is not working, she states she feels like she is taking nothing. Can she double up? What should she do? Please have nurse to call and advise pt. Thanks

## 2017-08-14 NOTE — Telephone Encounter (Signed)
Left VM with PCP recommendation.

## 2017-08-14 NOTE — Telephone Encounter (Signed)
Max is 40mg  so double up and let me know in one week.

## 2017-08-14 NOTE — Telephone Encounter (Signed)
Routing to PCP for review and recommendation.

## 2017-08-17 NOTE — Telephone Encounter (Signed)
[facility] to [facility] pt's Tramadol.  Last written 06-12-17 for #45, no RF  Last OV 07-22-17 No future appt scheduled  RX pended. Please review and send if appropriate.  Thanks!

## 2017-08-17 NOTE — Telephone Encounter (Signed)
Routed to pcp for signature.Kiki Bivens Lynetta, CMA  

## 2017-08-21 ENCOUNTER — Other Ambulatory Visit: Payer: Self-pay | Admitting: Physician Assistant

## 2017-08-21 MED ORDER — HYDROXYZINE HCL 50 MG PO TABS: 50.0000 mg | ORAL_TABLET | Freq: Three times a day (TID) | ORAL | 1 refills | Status: DC | PRN

## 2017-08-21 NOTE — Progress Notes (Signed)
25mg  is on back order. Sent 50mg  to split.

## 2017-08-25 ENCOUNTER — Telehealth: Payer: Self-pay | Admitting: *Deleted

## 2017-08-25 NOTE — Telephone Encounter (Signed)
Can we submit another PA on vyvanse now? Since she has failed focalin.

## 2017-08-25 NOTE — Telephone Encounter (Signed)
Pt left vm on my per triage request.  She stated that she has doubled the Focalin dose and now feels more anxious and is crying a lot now.

## 2017-08-27 NOTE — Telephone Encounter (Signed)
Received a denial letter from [place] that Vyvanse did not meet medical necessity and patient has been informed. Per PCP she advised that patient should go on Vyvanse website and print off a card. Patient is going to call insurance and talk to them about her medication. PCP is aware we have done everything on our end to approve this medication.

## 2017-09-04 ENCOUNTER — Encounter: Payer: Self-pay | Admitting: Physician Assistant

## 2017-09-09 ENCOUNTER — Other Ambulatory Visit: Payer: Self-pay | Admitting: Physician Assistant

## 2017-09-09 DIAGNOSIS — F411 Generalized anxiety disorder: Secondary | ICD-10-CM

## 2017-09-09 DIAGNOSIS — G5620 Lesion of ulnar nerve, unspecified upper limb: Secondary | ICD-10-CM

## 2017-09-11 ENCOUNTER — Other Ambulatory Visit: Payer: Self-pay | Admitting: Physician Assistant

## 2017-09-11 MED ORDER — DEXMETHYLPHENIDATE HCL ER 40 MG PO CP24: 1.0000 | ORAL_CAPSULE | Freq: Every day | ORAL | 0 refills | Status: DC

## 2017-09-12 ENCOUNTER — Other Ambulatory Visit: Payer: Self-pay | Admitting: Physician Assistant

## 2017-09-15 NOTE — Telephone Encounter (Signed)
Thank you :)

## 2017-09-15 NOTE — Telephone Encounter (Signed)
I spoke with Annette Cook at [place] is in the appeal process and a peer to peer has not been initiated at this time.  Left message for patient that the medication is in the appeal process.

## 2017-09-20 ENCOUNTER — Other Ambulatory Visit: Payer: Self-pay | Admitting: Physician Assistant

## 2017-09-21 ENCOUNTER — Other Ambulatory Visit: Payer: Self-pay | Admitting: Physician Assistant

## 2017-09-21 DIAGNOSIS — G5620 Lesion of ulnar nerve, unspecified upper limb: Secondary | ICD-10-CM

## 2017-10-12 ENCOUNTER — Other Ambulatory Visit: Payer: Self-pay | Admitting: Physician Assistant

## 2017-10-12 NOTE — Telephone Encounter (Signed)
Appeal letter sent to scan for approval from 10/02/2017 through 10/03/2018.

## 2017-10-17 ENCOUNTER — Other Ambulatory Visit: Payer: Self-pay | Admitting: Physician Assistant

## 2017-10-17 DIAGNOSIS — F900 Attention-deficit hyperactivity disorder, predominantly inattentive type: Secondary | ICD-10-CM

## 2017-10-17 DIAGNOSIS — F411 Generalized anxiety disorder: Secondary | ICD-10-CM

## 2017-11-09 ENCOUNTER — Other Ambulatory Visit: Payer: Self-pay | Admitting: Physician Assistant

## 2017-11-09 DIAGNOSIS — G5620 Lesion of ulnar nerve, unspecified upper limb: Secondary | ICD-10-CM

## 2017-11-10 ENCOUNTER — Encounter: Payer: Self-pay | Admitting: Physician Assistant

## 2017-11-11 ENCOUNTER — Ambulatory Visit: Payer: Managed Care, Other (non HMO) | Admitting: Physician Assistant

## 2017-12-02 ENCOUNTER — Other Ambulatory Visit: Payer: Self-pay | Admitting: Physician Assistant

## 2017-12-02 DIAGNOSIS — G5620 Lesion of ulnar nerve, unspecified upper limb: Secondary | ICD-10-CM

## 2017-12-02 NOTE — Telephone Encounter (Signed)
30 Days supply sent looks like needs f/u w/ PCP

## 2017-12-07 ENCOUNTER — Telehealth: Payer: Self-pay

## 2017-12-07 MED ORDER — PANTOPRAZOLE SODIUM 20 MG PO TBEC: 20.0000 mg | DELAYED_RELEASE_TABLET | Freq: Two times a day (BID) | ORAL | 0 refills | Status: DC

## 2017-12-07 NOTE — Telephone Encounter (Signed)
Pt called- needs RF on Protonix.   Follow up scheduled for September. Needs 90 day called in due to not being covered (cost $150).   Will call in refill. Pt aware she must keep her upcoming appt for any further refills

## 2017-12-15 ENCOUNTER — Ambulatory Visit: Payer: Managed Care, Other (non HMO) | Admitting: Physician Assistant

## 2017-12-21 ENCOUNTER — Telehealth: Payer: Self-pay | Admitting: Physician Assistant

## 2017-12-21 DIAGNOSIS — F900 Attention-deficit hyperactivity disorder, predominantly inattentive type: Secondary | ICD-10-CM

## 2017-12-21 MED ORDER — LISDEXAMFETAMINE DIMESYLATE 40 MG PO CAPS: 40.0000 mg | ORAL_CAPSULE | ORAL | 0 refills | Status: DC

## 2017-12-21 NOTE — Telephone Encounter (Signed)
Pt called. She wants to know if you can work her in today so that she can get refill on med.

## 2017-12-21 NOTE — Telephone Encounter (Signed)
I can send refill if she will keep appt when scheduled. Does that work? Sent vyvanse does she need anything else.

## 2017-12-21 NOTE — Telephone Encounter (Signed)
Pt said she only needed the vyvanse Thank you

## 2017-12-23 ENCOUNTER — Ambulatory Visit: Payer: Managed Care, Other (non HMO) | Admitting: Physician Assistant

## 2018-01-01 ENCOUNTER — Other Ambulatory Visit: Payer: Self-pay | Admitting: Physician Assistant

## 2018-01-01 DIAGNOSIS — G5620 Lesion of ulnar nerve, unspecified upper limb: Secondary | ICD-10-CM

## 2018-01-18 ENCOUNTER — Ambulatory Visit: Payer: Managed Care, Other (non HMO) | Admitting: Physician Assistant

## 2018-01-18 ENCOUNTER — Encounter: Payer: Self-pay | Admitting: Physician Assistant

## 2018-01-18 VITALS — BP 115/75 | HR 95 | Ht 61.0 in | Wt 115.0 lb

## 2018-01-18 DIAGNOSIS — F411 Generalized anxiety disorder: Secondary | ICD-10-CM | POA: Diagnosis not present

## 2018-01-18 DIAGNOSIS — G5620 Lesion of ulnar nerve, unspecified upper limb: Secondary | ICD-10-CM | POA: Diagnosis not present

## 2018-01-18 DIAGNOSIS — Z23 Encounter for immunization: Secondary | ICD-10-CM

## 2018-01-18 DIAGNOSIS — B009 Herpesviral infection, unspecified: Secondary | ICD-10-CM

## 2018-01-18 DIAGNOSIS — F900 Attention-deficit hyperactivity disorder, predominantly inattentive type: Secondary | ICD-10-CM

## 2018-01-18 DIAGNOSIS — F5102 Adjustment insomnia: Secondary | ICD-10-CM

## 2018-01-18 DIAGNOSIS — N3281 Overactive bladder: Secondary | ICD-10-CM

## 2018-01-18 DIAGNOSIS — K219 Gastro-esophageal reflux disease without esophagitis: Secondary | ICD-10-CM

## 2018-01-18 MED ORDER — FLUTICASONE PROPIONATE 50 MCG/ACT NA SUSP: 2.0000 | Freq: Every day | NASAL | 3 refills | Status: DC

## 2018-01-18 MED ORDER — MELOXICAM 15 MG PO TABS: 15.0000 mg | ORAL_TABLET | Freq: Every day | ORAL | 3 refills | Status: DC

## 2018-01-18 MED ORDER — LISDEXAMFETAMINE DIMESYLATE 40 MG PO CAPS: 40.0000 mg | ORAL_CAPSULE | ORAL | 0 refills | Status: DC

## 2018-01-18 MED ORDER — GABAPENTIN 300 MG PO CAPS: 300.0000 mg | ORAL_CAPSULE | Freq: Three times a day (TID) | ORAL | 1 refills | Status: DC

## 2018-01-18 MED ORDER — VALACYCLOVIR HCL 500 MG PO TABS: ORAL_TABLET | ORAL | 5 refills | Status: DC

## 2018-01-18 MED ORDER — BUPROPION HCL ER (XL) 150 MG PO TB24: ORAL_TABLET | ORAL | 1 refills | Status: DC

## 2018-01-18 MED ORDER — CLONAZEPAM 1 MG PO TABS: 1.0000 mg | ORAL_TABLET | Freq: Two times a day (BID) | ORAL | 3 refills | Status: DC | PRN

## 2018-01-18 MED ORDER — ESCITALOPRAM OXALATE 10 MG PO TABS: 10.0000 mg | ORAL_TABLET | Freq: Every day | ORAL | 2 refills | Status: DC

## 2018-01-18 MED ORDER — HYDROXYZINE HCL 50 MG PO TABS: 50.0000 mg | ORAL_TABLET | Freq: Three times a day (TID) | ORAL | 1 refills | Status: DC | PRN

## 2018-01-18 MED ORDER — CYCLOBENZAPRINE HCL 10 MG PO TABS: ORAL_TABLET | ORAL | 5 refills | Status: DC

## 2018-01-18 MED ORDER — ZOLPIDEM TARTRATE 10 MG PO TABS: 10.0000 mg | ORAL_TABLET | Freq: Every evening | ORAL | 5 refills | Status: DC | PRN

## 2018-01-18 MED ORDER — PANTOPRAZOLE SODIUM 20 MG PO TBEC: 20.0000 mg | DELAYED_RELEASE_TABLET | Freq: Two times a day (BID) | ORAL | 3 refills | Status: DC

## 2018-01-21 ENCOUNTER — Telehealth: Payer: Self-pay | Admitting: Physician Assistant

## 2018-01-21 DIAGNOSIS — N3281 Overactive bladder: Secondary | ICD-10-CM | POA: Insufficient documentation

## 2018-01-21 NOTE — Progress Notes (Signed)
Subjective:    Patient ID: Annette Cook, female    DOB: 10-18-1985, 32 y.o.   MRN: [id number]  HPI Pt is a 32 yo female with ADHD, GERD, ulnar nerve impingement, GAD who presents to the clinic for follow up.   ADHD is going well. She works hard but can tel the medication sticks with her. Denies any increased anxiety, palpitations, insomnia other than what she already has.   She does need a few refills.   She does need a note from work so that she does not get in trouble with her urinating so frequently. She goes frequently but does not go a lot when she urinates. No pain or odor.   She is in intermittent chronic pain with ulnar nerve. She takes tramadol as needed. She takes mobic daily.   She does feel a little congested today. She denies any fever, chills, SOB or cough.   .. Active Ambulatory Problems    Diagnosis Date Noted  . Herpes 12/02/2011  . Generalized anxiety disorder 10/22/2012  . Insomnia 10/22/2012  . Panic attack 10/22/2012  . ADHD (attention deficit hyperactivity disorder) 10/22/2012  . 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 Anxiety and depression 09/03/2015  . Takes daily multivitamins 09/12/2016  . 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 Opioid use agreement exists 10/24/2016  . Encounter for chronic pain management 10/24/2016  . Adult ADHD 04/10/2017  . Influenza-like illness 05/13/2017  . Overactive bladder 01/21/2018   Resolved Ambulatory Problems    Diagnosis Date Noted  . Supervision of normal IUP (intrauterine pregnancy) in multigravida 11/09/2011  . Rh negative status during pregnancy 02/03/2012  . Marginal placenta previa 02/03/2012  . Pain in throat 04/18/2013  . Acute stress reaction 07/08/2013  . Encounter for supervision of other normal pregnancy in first trimester 12/07/2013  . Anxiety during pregnancy in second trimester, antepartum 01/13/2014  . Active labor 06/01/2014  .  Vaginal delivery 06/01/2014  . Shoulder dystocia, delivered, current hospitalization 06/01/2014  . Bilateral hand numbness 01/16/2015  . Cough 02/21/2015  . Cervical radiculopathy at C8 02/27/2015  . Otitis, externa, infective 03/15/2015  . Polypharmacy 10/24/2016   Past Medical History:  Diagnosis Date  . Anxiety   . GERD (gastroesophageal reflux disease)       Review of Systems    see HPI.  Objective:   Physical Exam  Constitutional: She is oriented to person, place, and time. She appears well-developed and well-nourished.  HENT:  Head: Normocephalic and atraumatic.  Right Ear: External ear normal.  Left Ear: External ear normal.  Nose: Nose normal.  Mouth/Throat: Oropharynx is clear and moist. No oropharyngeal exudate.  TM's clear.   Eyes: Conjunctivae are normal.  Neck: Normal range of motion. Neck supple.  Cardiovascular: Normal rate and regular rhythm.  Pulmonary/Chest: Effort normal and breath sounds normal.  Lymphadenopathy:    She has no cervical adenopathy.  Neurological: She is alert and oriented to person, place, and time.  Skin: No rash noted.  Psychiatric: She has a normal mood and affect. Her behavior is normal.          Assessment & Plan:  Marland KitchenMarland KitchenDiagnoses and all orders for this visit:  Attention deficit hyperactivity disorder (ADHD), predominantly inattentive type -     buPROPion (WELLBUTRIN XL) 150 MG 24 hr tablet; TAKE 1 TABLET BY MOUTH EVERY DAY IN THE MORNING -     lisdexamfetamine (VYVANSE) 40 MG capsule; Take 1 capsule (  40 mg total) by mouth every morning. -     lisdexamfetamine (VYVANSE) 40 MG capsule; Take 1 capsule (40 mg total) by mouth every morning. -     lisdexamfetamine (VYVANSE) 40 MG capsule; Take 1 capsule (40 mg total) by mouth every morning.  Generalized anxiety disorder -     clonazePAM (KLONOPIN) 1 MG tablet; Take 1 tablet (1 mg total) by mouth 2 (two) times daily as needed for anxiety. -     buPROPion (WELLBUTRIN XL) 150 MG 24  hr tablet; TAKE 1 TABLET BY MOUTH EVERY DAY IN THE MORNING -     escitalopram (LEXAPRO) 10 MG tablet; Take 1 tablet (10 mg total) by mouth daily. -     hydrOXYzine (ATARAX/VISTARIL) 50 MG tablet; Take 1 tablet (50 mg total) by mouth every 8 (eight) hours as needed for anxiety.  Ulnar nerve entrapment, unspecified laterality -     gabapentin (NEURONTIN) 300 MG capsule; Take 1 capsule (300 mg total) by mouth 3 (three) times daily. Due for follow up -     meloxicam (MOBIC) 15 MG tablet; Take 1 tablet (15 mg total) by mouth daily. -     cyclobenzaprine (FLEXERIL) 10 MG tablet; Take one tablet as needed three times a day.  Adjustment insomnia -     zolpidem (AMBIEN) 10 MG tablet; Take 1 tablet (10 mg total) by mouth at bedtime as needed. for sleep  HSV infection -     valACYclovir (VALTREX) 500 MG tablet; TAKE 1 TABLET BY MOUTH EVERYDAY AT BEDTIME  Need for immunization against influenza -     Flu Vaccine QUAD 36+ mos IM  Overactive bladder  Gastroesophageal reflux disease, esophagitis presence not specified -     pantoprazole (PROTONIX) 20 MG tablet; Take 1 tablet (20 mg total) by mouth 2 (two) times daily.  Other orders -     fluticasone (FLONASE) 50 MCG/ACT nasal spray; Place 2 sprays into both nostrils daily.   Refilled medications.  Written note for work. Consider medication for OAB in future. Encouraged kegals. For congestion consider flonase and consider tylenol cold/sinus/severe if getting worse.

## 2018-01-21 NOTE — Telephone Encounter (Signed)
Please fax work note in EMR for frequent urinatrion to [phone]

## 2018-01-22 NOTE — Telephone Encounter (Signed)
Faxed

## 2018-01-28 ENCOUNTER — Other Ambulatory Visit: Payer: Self-pay

## 2018-01-28 DIAGNOSIS — K2100 Gastro-esophageal reflux disease with esophagitis, without bleeding: Secondary | ICD-10-CM

## 2018-03-18 ENCOUNTER — Encounter: Payer: Self-pay | Admitting: Physician Assistant

## 2018-04-12 ENCOUNTER — Ambulatory Visit: Payer: Managed Care, Other (non HMO) | Admitting: Physician Assistant

## 2018-04-16 ENCOUNTER — Ambulatory Visit: Payer: Managed Care, Other (non HMO) | Admitting: Physician Assistant

## 2018-04-21 ENCOUNTER — Ambulatory Visit: Payer: Managed Care, Other (non HMO) | Admitting: Physician Assistant

## 2018-04-26 ENCOUNTER — Ambulatory Visit (INDEPENDENT_AMBULATORY_CARE_PROVIDER_SITE_OTHER): Payer: Managed Care, Other (non HMO) | Admitting: Physician Assistant

## 2018-04-26 ENCOUNTER — Encounter: Payer: Self-pay | Admitting: Physician Assistant

## 2018-04-26 DIAGNOSIS — F411 Generalized anxiety disorder: Secondary | ICD-10-CM | POA: Diagnosis not present

## 2018-04-26 DIAGNOSIS — F900 Attention-deficit hyperactivity disorder, predominantly inattentive type: Secondary | ICD-10-CM | POA: Diagnosis not present

## 2018-04-26 DIAGNOSIS — F5102 Adjustment insomnia: Secondary | ICD-10-CM | POA: Diagnosis not present

## 2018-04-26 DIAGNOSIS — G5620 Lesion of ulnar nerve, unspecified upper limb: Secondary | ICD-10-CM

## 2018-04-26 MED ORDER — CLONAZEPAM 1 MG PO TABS: 1.0000 mg | ORAL_TABLET | Freq: Two times a day (BID) | ORAL | 5 refills | Status: DC | PRN

## 2018-04-26 MED ORDER — LISDEXAMFETAMINE DIMESYLATE 40 MG PO CAPS: 40.0000 mg | ORAL_CAPSULE | ORAL | 0 refills | Status: DC

## 2018-04-26 MED ORDER — BUPROPION HCL ER (XL) 150 MG PO TB24: ORAL_TABLET | ORAL | 1 refills | Status: DC

## 2018-04-26 MED ORDER — FAMOTIDINE 20 MG PO TABS: 20.0000 mg | ORAL_TABLET | Freq: Two times a day (BID) | ORAL | 1 refills | Status: DC

## 2018-04-26 MED ORDER — TRAMADOL HCL 50 MG PO TABS: 50.0000 mg | ORAL_TABLET | Freq: Two times a day (BID) | ORAL | 0 refills | Status: DC | PRN

## 2018-04-26 MED ORDER — ESCITALOPRAM OXALATE 10 MG PO TABS: 10.0000 mg | ORAL_TABLET | Freq: Every day | ORAL | 1 refills | Status: DC

## 2018-04-26 MED ORDER — AMPHETAMINE-DEXTROAMPHETAMINE 20 MG PO TABS: 20.0000 mg | ORAL_TABLET | Freq: Two times a day (BID) | ORAL | 0 refills | Status: DC

## 2018-04-26 MED ORDER — ZOLPIDEM TARTRATE 10 MG PO TABS: 10.0000 mg | ORAL_TABLET | Freq: Every evening | ORAL | 5 refills | Status: DC | PRN

## 2018-04-27 ENCOUNTER — Encounter: Payer: Self-pay | Admitting: Physician Assistant

## 2018-04-27 NOTE — Progress Notes (Signed)
Subjective:    Patient ID: Annette Cook, female    DOB: [birth year], 33 y.o.   MRN: [id number]  HPI Patient is a 33 year old female with ADHD, insomnia, ongoing nerve entrapment pain, GAD who presents to the clinic for medication refill.  ADHD-she is doing well on her vyvanse and well controlled.  She does report that she just got her Vyvanse filled on 1 7 and was cleaning out her car this weekend and threw her Vyvanse away.  Insurance will not refill Vyvanse at this time.  She wonders what she can do for month.  Insomnia controlled with Ambien.  Anxiety-overall she seems to be doing pretty good.  There is a lot of work stress going on with some staffing changes.  She denies any depression.  She does have ongoing ulnar nerve entrapment pain that she uses tramadol for.  She requests a refill.  .. Active Ambulatory Problems    Diagnosis Date Noted  . Herpes 12/02/2011  . Generalized anxiety disorder 10/22/2012  . Insomnia 10/22/2012  . Panic attack 10/22/2012  . ADHD (attention deficit hyperactivity disorder) 10/22/2012  . 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 Anxiety and depression 09/03/2015  . Takes daily multivitamins 09/12/2016  . 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 Opioid use agreement exists 10/24/2016  . Encounter for chronic pain management 10/24/2016  . Adult ADHD 04/10/2017  . Influenza-like illness 05/13/2017  . Overactive bladder 01/21/2018   Resolved Ambulatory Problems    Diagnosis Date Noted  . Supervision of normal IUP (intrauterine pregnancy) in multigravida 11/09/2011  . Rh negative status during pregnancy 02/03/2012  . Marginal placenta previa 02/03/2012  . Pain in throat 04/18/2013  . Acute stress reaction 07/08/2013  . Encounter for supervision of other normal pregnancy in first trimester 12/07/2013  . Anxiety during pregnancy in second trimester, antepartum 01/13/2014  . Active  labor 06/01/2014  . Vaginal delivery 06/01/2014  . Shoulder dystocia, delivered, current hospitalization 06/01/2014  . Bilateral hand numbness 01/16/2015  . Cough 02/21/2015  . Cervical radiculopathy at C8 02/27/2015  . Otitis, externa, infective 03/15/2015  . Polypharmacy 10/24/2016   Past Medical History:  Diagnosis Date  . Anxiety   . GERD (gastroesophageal reflux disease)       Review of Systems See HPI.     Objective:   Physical Exam Vitals signs reviewed.  Constitutional:      Appearance: Normal appearance.  HENT:     Head: Normocephalic and atraumatic.     Right Ear: Tympanic membrane normal.     Left Ear: Tympanic membrane normal.  Cardiovascular:     Rate and Rhythm: Normal rate and regular rhythm.  Pulmonary:     Effort: Pulmonary effort is normal.     Breath sounds: Normal breath sounds.  Neurological:     General: No focal deficit present.     Mental Status: She is alert and oriented to person, place, and time.  Psychiatric:        Mood and Affect: Mood normal.        Behavior: Behavior normal.           Assessment & Plan:  Marland KitchenMarland KitchenHideko was seen today for follow-up.  Diagnoses and all orders for this visit:  Attention deficit hyperactivity disorder (ADHD), predominantly inattentive type -     lisdexamfetamine (VYVANSE) 40 MG capsule; Take 1 capsule (40 mg total) by mouth every morning. -     lisdexamfetamine (VYVANSE)  40 MG capsule; Take 1 capsule (40 mg total) by mouth every morning. -     lisdexamfetamine (VYVANSE) 40 MG capsule; Take 1 capsule (40 mg total) by mouth every morning. -     buPROPion (WELLBUTRIN XL) 150 MG 24 hr tablet; TAKE 1 TABLET BY MOUTH EVERY DAY IN THE MORNING  Adjustment insomnia -     zolpidem (AMBIEN) 10 MG tablet; Take 1 tablet (10 mg total) by mouth at bedtime as needed. for sleep  Ulnar nerve entrapment, unspecified laterality -     traMADol (ULTRAM) 50 MG tablet; Take 1 tablet (50 mg total) by mouth every 12 (twelve)  hours as needed.  Generalized anxiety disorder -     escitalopram (LEXAPRO) 10 MG tablet; Take 1 tablet (10 mg total) by mouth daily. -     buPROPion (WELLBUTRIN XL) 150 MG 24 hr tablet; TAKE 1 TABLET BY MOUTH EVERY DAY IN THE MORNING -     clonazePAM (KLONOPIN) 1 MG tablet; Take 1 tablet (1 mg total) by mouth 2 (two) times daily as needed for anxiety.  Other orders -     amphetamine-dextroamphetamine (ADDERALL) 20 MG tablet; Take 1 tablet (20 mg total) by mouth 2 (two) times daily. -     famotidine (PEPCID) 20 MG tablet; Take 1 tablet (20 mg total) by mouth 2 (two) times daily.   Vyvanse refilled for 3 months.  I did give her 1 month of Adderall to use since she cannot get her Vyvanse and she can do this cash pay.  Tramadol refilled to use as needed.  Discussed not to use in combination with Klonopin.  Strongly encourage patient to use Klonopin sparingly and only as needed to avoid dependence.  Ambien refilled as well.  -need pain contract at next visit. She does not get her medication regularly as we forget to update.

## 2018-07-13 ENCOUNTER — Other Ambulatory Visit: Payer: Self-pay

## 2018-07-13 ENCOUNTER — Ambulatory Visit (INDEPENDENT_AMBULATORY_CARE_PROVIDER_SITE_OTHER): Payer: Managed Care, Other (non HMO) | Admitting: Physician Assistant

## 2018-07-13 ENCOUNTER — Encounter: Payer: Self-pay | Admitting: Physician Assistant

## 2018-07-13 VITALS — BP 130/85 | HR 82 | Ht 61.0 in | Wt 119.0 lb

## 2018-07-13 DIAGNOSIS — G5620 Lesion of ulnar nerve, unspecified upper limb: Secondary | ICD-10-CM | POA: Diagnosis not present

## 2018-07-13 DIAGNOSIS — G5623 Lesion of ulnar nerve, bilateral upper limbs: Secondary | ICD-10-CM

## 2018-07-13 DIAGNOSIS — F411 Generalized anxiety disorder: Secondary | ICD-10-CM

## 2018-07-13 DIAGNOSIS — F410 Panic disorder [episodic paroxysmal anxiety] without agoraphobia: Secondary | ICD-10-CM

## 2018-07-13 DIAGNOSIS — F900 Attention-deficit hyperactivity disorder, predominantly inattentive type: Secondary | ICD-10-CM | POA: Diagnosis not present

## 2018-07-13 DIAGNOSIS — F5101 Primary insomnia: Secondary | ICD-10-CM

## 2018-07-13 MED ORDER — TRAMADOL HCL ER 100 MG PO TB24: 100.0000 mg | ORAL_TABLET | Freq: Every day | ORAL | 0 refills | Status: AC

## 2018-07-13 MED ORDER — LISDEXAMFETAMINE DIMESYLATE 40 MG PO CAPS: 40.0000 mg | ORAL_CAPSULE | ORAL | 0 refills | Status: DC
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
## 2018-07-13 NOTE — Progress Notes (Signed)
Needs refill on the Vyvanse. The med is working well for her.

## 2018-07-14 ENCOUNTER — Encounter: Payer: Self-pay | Admitting: Physician Assistant

## 2018-07-26 ENCOUNTER — Telehealth: Payer: Self-pay | Admitting: Physician Assistant

## 2018-07-26 MED ORDER — HYDROCOD POLST-CPM POLST ER 10-8 MG/5ML PO SUER: 5.0000 mL | Freq: Two times a day (BID) | ORAL | 0 refills | Status: DC | PRN

## 2018-07-26 NOTE — Telephone Encounter (Signed)
Pt comes in with daughter and c/o annual cough and request prescription cough syrup. Sent tussionex.

## 2018-08-05 ENCOUNTER — Telehealth: Payer: Self-pay | Admitting: Physician Assistant

## 2018-08-05 DIAGNOSIS — G5620 Lesion of ulnar nerve, unspecified upper limb: Secondary | ICD-10-CM

## 2018-08-05 MED ORDER — TRAMADOL HCL 50 MG PO TABS: 50.0000 mg | ORAL_TABLET | Freq: Two times a day (BID) | ORAL | 0 refills | Status: DC | PRN

## 2018-08-05 NOTE — Telephone Encounter (Signed)
..  PDMP reviewed during this encounter. Refill requested of tramadol. Refill sent.

## 2018-09-27 ENCOUNTER — Telehealth: Payer: Self-pay | Admitting: Physician Assistant

## 2018-09-27 DIAGNOSIS — K219 Gastro-esophageal reflux disease without esophagitis: Secondary | ICD-10-CM

## 2018-09-27 MED ORDER — PANTOPRAZOLE SODIUM 20 MG PO TBEC: 20.0000 mg | DELAYED_RELEASE_TABLET | Freq: Two times a day (BID) | ORAL | 3 refills | Status: DC

## 2018-09-27 NOTE — Telephone Encounter (Signed)
Refill of protonix sent.

## 2018-10-07 ENCOUNTER — Other Ambulatory Visit: Payer: Self-pay | Admitting: Physician Assistant

## 2018-10-07 DIAGNOSIS — F900 Attention-deficit hyperactivity disorder, predominantly inattentive type: Secondary | ICD-10-CM

## 2018-10-07 DIAGNOSIS — F411 Generalized anxiety disorder: Secondary | ICD-10-CM

## 2018-10-14 ENCOUNTER — Other Ambulatory Visit: Payer: Self-pay | Admitting: Physician Assistant

## 2018-10-14 DIAGNOSIS — G5620 Lesion of ulnar nerve, unspecified upper limb: Secondary | ICD-10-CM

## 2018-10-18 NOTE — Telephone Encounter (Signed)
Patient of [facility].  Last refilled 08/05/2018 #45 with no refills.

## 2018-10-19 NOTE — Telephone Encounter (Signed)
Unable to leave a message, voicemail is full.  

## 2018-10-19 NOTE — Telephone Encounter (Signed)
Unfortunately I cannot refill tramadol.  I am not sure why you need tramadol additionally I see multiple other providers prescribing opiates recently.  Please schedule follow-up appointment with me or Luvenia Starch to discuss this further. PDMP reviewed during this encounter.,

## 2018-10-20 ENCOUNTER — Other Ambulatory Visit: Payer: Self-pay | Admitting: Physician Assistant

## 2018-10-20 DIAGNOSIS — G5620 Lesion of ulnar nerve, unspecified upper limb: Secondary | ICD-10-CM
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
## 2018-10-25 ENCOUNTER — Ambulatory Visit (INDEPENDENT_AMBULATORY_CARE_PROVIDER_SITE_OTHER): Payer: Managed Care, Other (non HMO)

## 2018-10-25 ENCOUNTER — Other Ambulatory Visit: Payer: Self-pay

## 2018-10-25 ENCOUNTER — Encounter: Payer: Self-pay | Admitting: Family Medicine

## 2018-10-25 ENCOUNTER — Ambulatory Visit: Payer: Managed Care, Other (non HMO) | Admitting: Family Medicine

## 2018-10-25 VITALS — BP 116/74 | HR 106 | Temp 98.3°F | Wt 127.0 lb

## 2018-10-25 DIAGNOSIS — G5620 Lesion of ulnar nerve, unspecified upper limb: Secondary | ICD-10-CM | POA: Diagnosis not present

## 2018-10-25 DIAGNOSIS — M25521 Pain in right elbow: Secondary | ICD-10-CM

## 2018-10-25 DIAGNOSIS — M542 Cervicalgia: Secondary | ICD-10-CM

## 2018-10-25 DIAGNOSIS — T148XXA Other injury of unspecified body region, initial encounter: Secondary | ICD-10-CM

## 2018-10-25 DIAGNOSIS — F430 Acute stress reaction: Secondary | ICD-10-CM

## 2018-10-25 MED ORDER — TRAMADOL HCL 50 MG PO TABS: 50.0000 mg | ORAL_TABLET | Freq: Two times a day (BID) | ORAL | 0 refills | Status: DC | PRN
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
## 2018-10-25 NOTE — Patient Instructions (Addendum)
Thank you for coming in today. Get xray now.  You should hear about therapy soon.  Attend physical therapy.  Use heating pad on neck.  Continue voltaren gel.  Recheck in 2 weeks.  If about the same we may be able to do a video visit.    I can fill out FMLA for your visits especially PT visit if needed.

## 2018-10-28 ENCOUNTER — Other Ambulatory Visit: Payer: Self-pay

## 2018-10-28 ENCOUNTER — Ambulatory Visit (INDEPENDENT_AMBULATORY_CARE_PROVIDER_SITE_OTHER): Payer: Managed Care, Other (non HMO)

## 2018-10-28 ENCOUNTER — Ambulatory Visit: Payer: Managed Care, Other (non HMO) | Admitting: Family Medicine

## 2018-10-28 VITALS — BP 123/62 | HR 90 | Temp 98.6°F | Wt 126.0 lb

## 2018-10-28 DIAGNOSIS — W19XXXA Unspecified fall, initial encounter: Secondary | ICD-10-CM | POA: Diagnosis not present

## 2018-10-28 DIAGNOSIS — M542 Cervicalgia: Secondary | ICD-10-CM

## 2018-10-28 DIAGNOSIS — G5620 Lesion of ulnar nerve, unspecified upper limb: Secondary | ICD-10-CM

## 2018-10-28 DIAGNOSIS — M25521 Pain in right elbow: Secondary | ICD-10-CM

## 2018-10-28 NOTE — Patient Instructions (Signed)
Thank you for coming in today.  Attend PT.  Use an elbow pad.  Ice is ok but not near the ulnar nerve.   Keep me updated.  Follow up as previously arranged.     Contusion A contusion is a deep bruise. Contusions are the result of a blunt injury to tissues and muscle fibers under the skin. The injury causes bleeding under the skin. The skin overlying the contusion may turn blue, purple, or yellow. Minor injuries will give you a painless contusion, but more severe injuries cause contusions that may stay painful and swollen for a few weeks. Follow these instructions at home: Pay attention to any changes in your symptoms. Let your health care provider know about them. Take these actions to relieve your pain. Managing pain, stiffness, and swelling   Use resting, icing, applying pressure (compression), and raising (elevating) the injured area. This is often called the RICE strategy. ? Rest the injured area. Return to your normal activities as told by your health care provider. Ask your health care provider what activities are safe for you. ? If directed, put ice on the injured area:  Put ice in a plastic bag.  Place a towel between your skin and the bag.  Leave the ice on for 20 minutes, 2-3 times per day. ? If directed, apply light compression to the injured area using an elastic bandage. Make sure the bandage is not wrapped too tightly. Remove and reapply the bandage as directed by your health care provider. ? If possible, raise (elevate) the injured area above the level of your heart while you are sitting or lying down. General instructions  Take over-the-counter and prescription medicines only as told by your health care provider.  Keep all follow-up visits as told by your health care provider. This is important. Contact a health care provider if:  Your symptoms do not improve after several days of treatment.  Your symptoms get worse.  You have difficulty moving the injured  area. Get help right away if:  You have severe pain.  You have numbness in a hand or foot.  Your hand or foot turns pale or cold. Summary  A contusion is a deep bruise.  Contusions are the result of a blunt injury to tissues and muscle fibers under the skin.  It is treated with rest, ice, compression, and elevation. You may be given over-the-counter medicines for pain.  Contact a health care provider if your symptoms do not improve, or get worse.  Get help right away if you have severe pain, have numbness, or the area turns pale or cold. This information is not intended to replace advice given to you by your health care provider. Make sure you discuss any questions you have with your health care provider. Document Released: 01/08/2005 Document Revised: 11/19/2017 Document Reviewed: 11/19/2017 Elsevier Patient Education  2020 [employer].
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
## 2018-11-02 ENCOUNTER — Ambulatory Visit: Payer: Managed Care, Other (non HMO) | Admitting: Physician Assistant

## 2018-11-10 ENCOUNTER — Telehealth: Payer: Self-pay | Admitting: Physician Assistant

## 2018-11-10 ENCOUNTER — Ambulatory Visit: Payer: Managed Care, Other (non HMO) | Admitting: Family Medicine

## 2018-11-11 ENCOUNTER — Ambulatory Visit (INDEPENDENT_AMBULATORY_CARE_PROVIDER_SITE_OTHER): Payer: Managed Care, Other (non HMO) | Admitting: Physician Assistant

## 2018-11-11 VITALS — Ht 61.0 in | Wt 125.0 lb

## 2018-11-11 DIAGNOSIS — F4310 Post-traumatic stress disorder, unspecified: Secondary | ICD-10-CM | POA: Diagnosis not present

## 2018-11-11 DIAGNOSIS — M542 Cervicalgia: Secondary | ICD-10-CM

## 2018-11-11 DIAGNOSIS — F419 Anxiety disorder, unspecified: Secondary | ICD-10-CM

## 2018-11-11 DIAGNOSIS — M545 Low back pain, unspecified: Secondary | ICD-10-CM

## 2018-11-11 DIAGNOSIS — F430 Acute stress reaction: Secondary | ICD-10-CM

## 2018-11-11 DIAGNOSIS — F900 Attention-deficit hyperactivity disorder, predominantly inattentive type: Secondary | ICD-10-CM

## 2018-11-11 DIAGNOSIS — G5620 Lesion of ulnar nerve, unspecified upper limb: Secondary | ICD-10-CM

## 2018-11-11 MED ORDER — LISDEXAMFETAMINE DIMESYLATE 40 MG PO CAPS: 40.0000 mg | ORAL_CAPSULE | ORAL | 0 refills | Status: DC

## 2018-11-11 MED ORDER — DULOXETINE HCL 30 MG PO CPEP: 30.0000 mg | ORAL_CAPSULE | Freq: Every day | ORAL | 0 refills | Status: DC
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
## 2018-11-12 ENCOUNTER — Encounter: Payer: Self-pay | Admitting: Physician Assistant

## 2018-11-12 ENCOUNTER — Other Ambulatory Visit: Payer: Self-pay | Admitting: Physician Assistant

## 2018-11-12 DIAGNOSIS — F4310 Post-traumatic stress disorder, unspecified: Secondary | ICD-10-CM | POA: Insufficient documentation

## 2018-11-12 DIAGNOSIS — M7918 Myalgia, other site: Secondary | ICD-10-CM | POA: Insufficient documentation

## 2018-11-12 DIAGNOSIS — M545 Low back pain: Secondary | ICD-10-CM | POA: Insufficient documentation

## 2018-11-12 DIAGNOSIS — M542 Cervicalgia: Secondary | ICD-10-CM | POA: Insufficient documentation

## 2018-11-12 DIAGNOSIS — F419 Anxiety disorder, unspecified: Secondary | ICD-10-CM | POA: Insufficient documentation

## 2018-11-12 DIAGNOSIS — F5102 Adjustment insomnia: Secondary | ICD-10-CM

## 2018-11-12 MED ORDER — CELECOXIB 100 MG PO CAPS: 100.0000 mg | ORAL_CAPSULE | Freq: Two times a day (BID) | ORAL | 5 refills | Status: DC

## 2018-11-12 MED ORDER — PREDNISONE 50 MG PO TABS: ORAL_TABLET | ORAL | 0 refills | Status: DC

## 2018-11-12 NOTE — Progress Notes (Signed)
Pt called back and just in a lot of pain. Mostly back. Prednisone burst and stop ibuprofen and start celebrex.

## 2018-11-15 ENCOUNTER — Ambulatory Visit: Payer: Managed Care, Other (non HMO) | Admitting: Family Medicine

## 2018-11-15 ENCOUNTER — Other Ambulatory Visit: Payer: Self-pay

## 2018-11-15 ENCOUNTER — Encounter: Payer: Self-pay | Admitting: Family Medicine

## 2018-11-15 VITALS — BP 106/65 | HR 76 | Temp 97.6°F | Wt 124.0 lb

## 2018-11-15 DIAGNOSIS — M25561 Pain in right knee: Secondary | ICD-10-CM

## 2018-11-15 DIAGNOSIS — M545 Low back pain, unspecified: Secondary | ICD-10-CM

## 2018-11-15 DIAGNOSIS — M25562 Pain in left knee: Secondary | ICD-10-CM

## 2018-11-15 DIAGNOSIS — M542 Cervicalgia: Secondary | ICD-10-CM | POA: Diagnosis not present

## 2018-11-15 DIAGNOSIS — M25521 Pain in right elbow: Secondary | ICD-10-CM | POA: Diagnosis not present

## 2018-11-15 NOTE — Patient Instructions (Addendum)
Thank you for coming in today. Continue current plan.  Try to get the local PT scheduled.  If not then [facility] is a good option.  Try cymbalta at bedtime. I think it may be making you sleepy.  If not ok then let me or Luvenia Starch know and we can go back to Lexapro.   Keep me updated.   Recheck in 6 week or sooner if needed.

## 2018-11-15 NOTE — Telephone Encounter (Signed)
Last filled in January for 6 months.
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
## 2018-12-01 ENCOUNTER — Ambulatory Visit: Payer: Self-pay | Admitting: Professional

## 2018-12-01 ENCOUNTER — Other Ambulatory Visit: Payer: Self-pay | Admitting: Physician Assistant

## 2018-12-14 ENCOUNTER — Other Ambulatory Visit: Payer: Self-pay | Admitting: Physician Assistant

## 2018-12-14 DIAGNOSIS — M545 Low back pain, unspecified: Secondary | ICD-10-CM

## 2018-12-14 DIAGNOSIS — F411 Generalized anxiety disorder: Secondary | ICD-10-CM

## 2018-12-14 DIAGNOSIS — F4310 Post-traumatic stress disorder, unspecified: Secondary | ICD-10-CM

## 2018-12-14 DIAGNOSIS — F900 Attention-deficit hyperactivity disorder, predominantly inattentive type: Secondary | ICD-10-CM

## 2018-12-14 DIAGNOSIS — F419 Anxiety disorder, unspecified: Secondary | ICD-10-CM

## 2018-12-15 ENCOUNTER — Other Ambulatory Visit: Payer: Self-pay | Admitting: Physician Assistant

## 2018-12-15 DIAGNOSIS — M545 Low back pain, unspecified: Secondary | ICD-10-CM

## 2018-12-15 DIAGNOSIS — F411 Generalized anxiety disorder: Secondary | ICD-10-CM

## 2018-12-15 DIAGNOSIS — F419 Anxiety disorder, unspecified: Secondary | ICD-10-CM

## 2018-12-15 DIAGNOSIS — F900 Attention-deficit hyperactivity disorder, predominantly inattentive type: Secondary | ICD-10-CM

## 2018-12-15 DIAGNOSIS — F4310 Post-traumatic stress disorder, unspecified: Secondary | ICD-10-CM

## 2018-12-27 ENCOUNTER — Ambulatory Visit: Payer: Managed Care, Other (non HMO) | Admitting: Family Medicine

## 2018-12-27 ENCOUNTER — Other Ambulatory Visit: Payer: Self-pay

## 2018-12-27 DIAGNOSIS — Z20822 Contact with and (suspected) exposure to covid-19: Secondary | ICD-10-CM

## 2018-12-28 LAB — NOVEL CORONAVIRUS, NAA: SARS-CoV-2, NAA: NOT DETECTED

## 2018-12-30 ENCOUNTER — Encounter: Payer: Self-pay | Admitting: Neurology

## 2018-12-30 NOTE — Telephone Encounter (Signed)
Letter written to be out of work for daughter's illness. Per patient to fax to [phone]. Letter faxed.

## 2019-01-03 ENCOUNTER — Other Ambulatory Visit: Payer: Self-pay | Admitting: Physician Assistant

## 2019-01-03 DIAGNOSIS — B009 Herpesviral infection, unspecified: Secondary | ICD-10-CM

## 2019-01-03 DIAGNOSIS — G5620 Lesion of ulnar nerve, unspecified upper limb: Secondary | ICD-10-CM

## 2019-01-03 MED ORDER — TRAMADOL HCL 50 MG PO TABS: 50.0000 mg | ORAL_TABLET | Freq: Two times a day (BID) | ORAL | 0 refills | Status: DC | PRN

## 2019-01-03 MED ORDER — VALACYCLOVIR HCL 500 MG PO TABS: ORAL_TABLET | ORAL | 5 refills | Status: DC

## 2019-01-03 NOTE — Telephone Encounter (Signed)
Pt has been updated and aware both med refills sent to local pharmacy. No other inquiries during call.

## 2019-01-06 ENCOUNTER — Emergency Department (HOSPITAL_COMMUNITY)
Admission: EM | Admit: 2019-01-06 | Discharge: 2019-01-06 | Disposition: A | Discharge status: Home / Self Care | Payer: Managed Care, Other (non HMO) | Attending: Emergency Medicine | Admitting: Emergency Medicine

## 2019-01-06 ENCOUNTER — Encounter (HOSPITAL_COMMUNITY): Payer: Self-pay

## 2019-01-06 ENCOUNTER — Emergency Department (INDEPENDENT_AMBULATORY_CARE_PROVIDER_SITE_OTHER)
Admission: EM | Admit: 2019-01-06 | Discharge: 2019-01-06 | Payer: Managed Care, Other (non HMO) | Source: Home / Self Care

## 2019-01-06 ENCOUNTER — Emergency Department (HOSPITAL_COMMUNITY): Payer: Managed Care, Other (non HMO)

## 2019-01-06 ENCOUNTER — Other Ambulatory Visit: Payer: Self-pay

## 2019-01-06 DIAGNOSIS — R4182 Altered mental status, unspecified: Secondary | ICD-10-CM | POA: Diagnosis present

## 2019-01-06 DIAGNOSIS — R569 Unspecified convulsions: Secondary | ICD-10-CM

## 2019-01-06 DIAGNOSIS — R404 Transient alteration of awareness: Secondary | ICD-10-CM | POA: Diagnosis not present

## 2019-01-06 DIAGNOSIS — R4781 Slurred speech: Secondary | ICD-10-CM

## 2019-01-06 DIAGNOSIS — Z79899 Other long term (current) drug therapy: Secondary | ICD-10-CM | POA: Insufficient documentation

## 2019-01-06 DIAGNOSIS — Z87891 Personal history of nicotine dependence: Secondary | ICD-10-CM | POA: Diagnosis not present

## 2019-01-06 HISTORY — DX: Unspecified convulsions: R56.9

## 2019-01-06 LAB — COMPREHENSIVE METABOLIC PANEL
ALT: 16 U/L (ref 0–44)
AST: 20 U/L (ref 15–41)
Albumin: 4.4 g/dL (ref 3.5–5.0)
Alkaline Phosphatase: 40 U/L (ref 38–126)
Anion gap: 11 (ref 5–15)
BUN: 7 mg/dL (ref 6–20)
CO2: 23 mmol/L (ref 22–32)
Calcium: 8.9 mg/dL (ref 8.9–10.3)
Chloride: 106 mmol/L (ref 98–111)
Creatinine, Ser: 0.75 mg/dL (ref 0.44–1.00)
GFR calc Af Amer: >60 mL/min (ref >60)
GFR calc non Af Amer: >60 mL/min (ref >60)
Glucose, Bld: 107 mg/dL — ABNORMAL HIGH (ref 70–99)
Potassium: 3.1 mmol/L — ABNORMAL LOW (ref 3.5–5.1)
Sodium: 140 mmol/L (ref 135–145)
Total Bilirubin: 0.8 mg/dL (ref 0.3–1.2)
Total Protein: 6.7 g/dL (ref 6.5–8.1)

## 2019-01-06 LAB — PROTIME-INR
INR: 1 (ref 0.8–1.2)
Prothrombin Time: 13.5 seconds (ref 11.4–15.2)

## 2019-01-06 LAB — RAPID URINE DRUG SCREEN, HOSP PERFORMED
Amphetamines: NOT DETECTED
Barbiturates: NOT DETECTED
Benzodiazepines: NOT DETECTED
Cocaine: NOT DETECTED
Opiates: NOT DETECTED
Tetrahydrocannabinol: NOT DETECTED

## 2019-01-06 LAB — DIFFERENTIAL
Abs Immature Granulocytes: 0.1 10*3/uL — ABNORMAL HIGH (ref 0.00–0.07)
Basophils Absolute: 0.1 10*3/uL (ref 0.0–0.1)
Basophils Relative: 0 %
Eosinophils Absolute: 0.1 10*3/uL (ref 0.0–0.5)
Eosinophils Relative: 0 %
Immature Granulocytes: 1 %
Lymphocytes Relative: 10 %
Lymphs Abs: 1.9 10*3/uL (ref 0.7–4.0)
Monocytes Absolute: 1.3 10*3/uL — ABNORMAL HIGH (ref 0.1–1.0)
Monocytes Relative: 7 %
Neutro Abs: 15.4 10*3/uL — ABNORMAL HIGH (ref 1.7–7.7)
Neutrophils Relative %: 82 %

## 2019-01-06 LAB — I-STAT CHEM 8, ED
BUN: 8 mg/dL (ref 6–20)
Calcium, Ion: 1.11 mmol/L — ABNORMAL LOW (ref 1.15–1.40)
Chloride: 105 mmol/L (ref 98–111)
Creatinine, Ser: 0.6 mg/dL (ref 0.44–1.00)
Glucose, Bld: 100 mg/dL — ABNORMAL HIGH (ref 70–99)
HCT: 43 % (ref 36.0–46.0)
Hemoglobin: 14.6 g/dL (ref 12.0–15.0)
Potassium: 3.4 mmol/L — ABNORMAL LOW (ref 3.5–5.1)
Sodium: 140 mmol/L (ref 135–145)
TCO2: 25 mmol/L (ref 22–32)

## 2019-01-06 LAB — CBC
HCT: 42 % (ref 36.0–46.0)
Hemoglobin: 13.8 g/dL (ref 12.0–15.0)
MCH: 32 pg (ref 26.0–34.0)
MCHC: 32.9 g/dL (ref 30.0–36.0)
MCV: 97.4 fL (ref 80.0–100.0)
Platelets: 298 10*3/uL (ref 150–400)
RBC: 4.31 MIL/uL (ref 3.87–5.11)
RDW: 14 % (ref 11.5–15.5)
WBC: 18.8 10*3/uL — ABNORMAL HIGH (ref 4.0–10.5)
nRBC: 0 % (ref 0.0–0.2)

## 2019-01-06 LAB — CBG MONITORING, ED: Glucose-Capillary: 100 mg/dL — ABNORMAL HIGH (ref 70–99)

## 2019-01-06 LAB — APTT: aPTT: 28 seconds (ref 24–36)

## 2019-01-06 LAB — ETHANOL: Alcohol, Ethyl (B): <10 mg/dL (ref <10)

## 2019-01-06 LAB — I-STAT BETA HCG BLOOD, ED (MC, WL, AP ONLY): I-stat hCG, quantitative: <5 m[IU]/mL (ref <5)

## 2019-01-06 MED ORDER — POTASSIUM CHLORIDE CRYS ER 20 MEQ PO TBCR
40.0000 meq | EXTENDED_RELEASE_TABLET | Freq: Once | ORAL | Status: AC
  Administered 2019-01-06: 15:00:00 40 meq via ORAL
  Filled 2019-01-06: qty 2

## 2019-01-06 MED ORDER — SODIUM CHLORIDE 0.9 % IV BOLUS
1000.0000 mL | Freq: Once | INTRAVENOUS | Status: AC
  Administered 2019-01-06: 1000 mL via INTRAVENOUS

## 2019-01-06 MED ORDER — SODIUM CHLORIDE 0.9% FLUSH
3.0000 mL | Freq: Once | INTRAVENOUS | Status: AC
  Administered 2019-01-06: 3 mL via INTRAVENOUS
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
## 2019-01-06 NOTE — ED Notes (Signed)
Patient Alert and oriented to baseline. Stable and ambulatory to baseline. Patient verbalized understanding of the discharge instructions.  Patient belongings were taken by the patient.   

## 2019-01-06 NOTE — ED Provider Notes (Signed)
[facility] EMERGENCY DEPARTMENT Provider Note   CSN: [id number] Arrival date & time: 01/06/19  1054     History   Chief Complaint Chief Complaint  Patient presents with   Altered Mental Status    HPI Annette Cook is a 33 y.o. female.     The history is provided by the patient, the spouse and medical records. No language interpreter was used.  Altered Mental Status    33 year old female with history of anxiety, ADHD, insomnia presenting for evaluation of altered mental status.  Last night around 8 AM patient has an episode of seizure-like activities.  History is difficult to obtain from patient, additional history was obtained from husband who is at bedside.  Per husband, around 8 PM last night patient was going from acting normal and after they had an sexual encounter patient then states "I have done something", subsequently patient was acting bizarre, having seizure-like activities with her head draws to the left side, follows with body tremors lasting for approximately "12 minutes" followed by a period of time when she was sleeping.  Husband did call EMS who arrived but at that time patient refused EMS transport.  She did not sleep throughout the night, and today she goes from acting normal to saying things that does not make any sense according to the husband.  He voiced concern prompting urgent care visit but was sent here for further stroke work-up.  Patient states she has been under a lot of stress with work and with her daughter being pregnant.  She admits she is feeling depressed but denies any SI or HI denies any auditory or visual hallucination.  She admits to taking her antianxiety medication several times yesterday but denies any recreational drug use.  She did mention drinking small amount of alcohol last night however her husband states she did not drink any alcohol.  Husband does not feel like patient is having side effect from her medication as her  symptom has been persistent since 8 PM last night.  Patient is tearful, history is limited.  She does have known history of panic attack and anxiety as well as PTSD and insomnia.  Furthermore, it was reported the patient's mother had seizure and then died from a stroke at the age of 63.  Patient also currently taking Lexapro for depression.  She is currently complaining of pain in her tongue from accidental biting during her seizure-like activities.  No history of prior seizures.  She denies fever, headache, nausea vomiting diarrhea.  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   Anxiety and depression 09/03/2015   Patellofemoral pain syndrome 04/12/2015   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 03/15/2015   Acute stress reaction 07/08/2013   Generalized anxiety disorder 10/22/2012   Insomnia 10/22/2012   Panic attack 10/22/2012   ADHD (attention deficit hyperactivity disorder) 10/22/2012   Herpes 12/02/2011    Past Surgical History:  Procedure Laterality Date   APPENDECTOMY     TONSILLECTOMY AND ADENOIDECTOMY     WISDOM TOOTH EXTRACTION       OB History    Gravida  3   Para  3   Term  3   Preterm      AB      Living  3     SAB      TAB      Ectopic      Multiple  0   Live Births  3            Home Medications    Prior to Admission medications   Medication Sig Start Date End  Date Taking? Authorizing Provider  buPROPion (WELLBUTRIN XL) 150 MG 24 hr tablet TAKE 1 TABLET BY MOUTH EVERY DAY IN THE MORNING *NEED AN APP'T* 12/15/18   Iran Planas L, PA-C  celecoxib (CELEBREX) 100 MG capsule Take 1 capsule (100 mg total) by mouth 2 (two) times daily. 11/12/18   Breeback, Jade L, PA-C  clonazePAM (KLONOPIN) 1 MG tablet Take 1 tablet (1 mg total) by mouth 2 (two) times daily as needed for anxiety. 04/26/18   Breeback, Jade L, PA-C  diclofenac sodium (VOLTAREN) 1 % GEL Apply 2 g topically 4 (four) times daily. To affected joint. 07/20/17   Gregor Hams, MD  DULoxetine (CYMBALTA) 30 MG capsule TAKE 1 CAPSULE BY MOUTH EVERY DAY 12/15/18   Breeback, Jade L, PA-C  famotidine (PEPCID) 20 MG tablet Take 1 tablet (20 mg total) by mouth 2 (two) times daily. 04/26/18   Breeback, Jade L, PA-C  fluticasone (FLONASE) 50 MCG/ACT nasal spray Place 2 sprays into both nostrils daily. 01/18/18   Breeback, Jade L, PA-C  gabapentin (NEURONTIN) 300 MG capsule Take 300 mg by mouth 3 (three) times daily.    [provider]  HYDROcodone-acetaminophen (NORCO/VICODIN) 5-325 MG tablet Take one tablet every 6 hours or as needed in the evening for bilateral subluxation of ulnar nerve 07/08/17   Breeback, Jade L, PA-C  hydrOXYzine (ATARAX/VISTARIL) 50 MG tablet Take 1 tablet (50 mg total) by mouth every 8 (eight) hours as needed for anxiety. 01/18/18   Breeback, Jade L, PA-C  lisdexamfetamine (VYVANSE) 40 MG capsule Take 1 capsule (40 mg total) by mouth every morning. 01/12/19   Breeback, Jade L, PA-C  lisdexamfetamine (VYVANSE) 40 MG capsule Take 1 capsule (40 mg total) by mouth every morning. 12/12/18   Breeback, Jade L, PA-C  lisdexamfetamine (VYVANSE) 40 MG capsule Take 1 capsule (40 mg total) by mouth every morning. 11/11/18   Breeback, Jade L, PA-C  mupirocin ointment (BACTROBAN) 2 % APPLY TO AFFECTED AREA(S)  TOPICALLY 3 TIMES DAILY 01/18/16   Breeback, Jade L, PA-C  pantoprazole (PROTONIX) 20 MG tablet  Take 1 tablet (20 mg total) by mouth 2 (two) times daily. 09/27/18   Donella Stade, PA-C  PROAIR HFA 108 (90 Base) MCG/ACT inhaler USE 2 PUFFS 15-30 MINUTES BEFORE EXERCISE. 06/04/16   Breeback, Jade L, PA-C  progesterone (PROMETRIUM) 200 MG capsule Take 200 mg by mouth daily.    [provider]  traMADol (ULTRAM) 50 MG tablet Take 1 tablet (50 mg total) by mouth every 12 (twelve) hours as needed. 01/03/19   Breeback, Royetta Car, PA-C  valACYclovir (VALTREX) 500 MG tablet TAKE 1 TABLET BY MOUTH EVERYDAY AT BEDTIME 01/03/19   Breeback, Jade L, PA-C  zolpidem (AMBIEN) 10 MG tablet TAKE 1 TABLET BY MOUTH AT BEDTIME AS NEEDED FOR  SLEEP 11/15/18   Donella Stade, PA-C    Family History Family History  Problem Relation Age of Onset   Hypertension Mother    Stroke Mother     Social History Social History   Tobacco Use   Smoking status: Former Smoker    Packs/day: 1.00    Years: 7.00    Pack years: 7.00    Types: Cigarettes    Quit date: 10/22/2004    Years since quitting: 14.2   Smokeless tobacco: Never Used  Substance Use Topics   Alcohol use: Yes    Comment: socially   Drug use: No     Allergies   Patient has no known allergies.   Review of Systems Review of Systems  Unable to perform ROS: Mental status change     Physical Exam Updated Vital Signs BP 118/81    Pulse 90    Temp 98.7 F (37.1 C) (Oral)    Resp 18    LMP  (LMP Unknown)    SpO2 100%   Physical Exam Vitals signs and nursing note reviewed.  Constitutional:      Appearance: She is well-developed.     Comments: Patient is squirming about the bed, tearful, appears uncomfortable.  HENT:     Head: Normocephalic and atraumatic.     Mouth/Throat:     Comments: Bruising noted to right lateral tongue without deep laceration. Eyes:     Extraocular Movements: Extraocular movements intact.     Conjunctiva/sclera: Conjunctivae normal.     Pupils: Pupils are equal, round, and reactive to light.  Neck:      Musculoskeletal: Neck supple. No neck rigidity.  Cardiovascular:     Rate and Rhythm: Normal rate and regular rhythm.     Heart sounds: Normal heart sounds.  Pulmonary:     Effort: Pulmonary effort is normal. No respiratory distress.     Breath sounds: Normal breath sounds.  Abdominal:     Palpations: Abdomen is soft.     Tenderness: There is no abdominal tenderness.  Skin:    Findings: No rash.  Neurological:     Mental Status: She is alert and oriented to person, place, and time.     GCS: GCS eye subscore is 4. GCS verbal subscore is 5. GCS motor subscore is 6.     Cranial Nerves: Cranial nerves are intact.     Sensory: Sensation is intact.     Motor: Motor function is intact.     Coordination: Coordination is intact.     Gait: Gait is intact.  Psychiatric:        Attention and Perception: She is inattentive.        Mood and Affect: Mood is anxious. Affect is tearful.        Speech: Speech is tangential.        Behavior: Behavior is agitated.        Thought Content: Thought content does not include homicidal or suicidal ideation.        Cognition and Memory: Cognition is impaired.        Judgment: Judgment is inappropriate.      ED Treatments / Results  Labs (all labs ordered are listed, but only abnormal results are displayed) Labs Reviewed  CBC - Abnormal; Notable for the following components:      Result Value   WBC 18.8 (*)    All other components within normal limits  DIFFERENTIAL - Abnormal; Notable for the following components:   Neutro  Abs 15.4 (*)    Monocytes Absolute 1.3 (*)    Abs Immature Granulocytes 0.10 (*)    All other components within normal limits  COMPREHENSIVE METABOLIC PANEL - Abnormal; Notable for the following components:   Potassium 3.1 (*)    Glucose, Bld 107 (*)    All other components within normal limits  I-STAT CHEM 8, ED - Abnormal; Notable for the following components:   Potassium 3.4 (*)    Glucose, Bld 100 (*)    Calcium, Ion  1.11 (*)    All other components within normal limits  CBG MONITORING, ED - Abnormal; Notable for the following components:   Glucose-Capillary 100 (*)    All other components within normal limits  PROTIME-INR  APTT  RAPID URINE DRUG SCREEN, HOSP PERFORMED  ETHANOL  I-STAT BETA HCG BLOOD, ED ([facility], [facility], [facility] ONLY)    EKG EKG Interpretation  Date/Time:  Thursday January 06 2019 11:10:20 EDT Ventricular Rate:  79 PR Interval:  110 QRS Duration: 82 QT Interval:  366 QTC Calculation: 419 R Axis:   76 Text Interpretation:  Sinus rhythm with sinus arrhythmia with short PR Cannot rule out Inferior infarct , age undetermined Abnormal ECG Confirmed by Pattricia Boss [phone]) on 01/06/2019 1:00:57 PM   Radiology Ct Head Wo Contrast  Result Date: 01/06/2019 CLINICAL DATA:  Seizure EXAM: CT HEAD WITHOUT CONTRAST TECHNIQUE: Contiguous axial images were obtained from the base of the skull through the vertex without intravenous contrast. COMPARISON:  None. FINDINGS: Brain: No evidence of acute infarction, hemorrhage, hydrocephalus, extra-axial collection or mass lesion/mass effect. Vascular: No hyperdense vessel or unexpected calcification. Skull: Normal. Negative for fracture or focal lesion. Sinuses/Orbits: No acute finding. Other: None. IMPRESSION: No acute intracranial findings. Electronically Signed   By: Davina Poke M.D.   On: 01/06/2019 12:10    Procedures Procedures (including critical care time)  Medications Ordered in ED Medications  sodium chloride flush (NS) 0.9 % injection 3 mL (has no administration in time range)  potassium chloride SA (K-DUR) CR tablet 40 mEq (has no administration in time range)  sodium chloride 0.9 % bolus 1,000 mL (1,000 mLs Intravenous New Bag/Given 01/06/19 1300)     Initial Impression / Assessment and Plan / ED Course  I have reviewed the triage vital signs and the nursing notes.  Pertinent labs & imaging results that were available during my care of  the patient were reviewed by me and considered in my medical decision making (see chart for details).        BP 118/81    Pulse 90    Temp 98.7 F (37.1 C) (Oral)    Resp 18    LMP  (LMP Unknown)    SpO2 100%    Final Clinical Impressions(s) / ED Diagnoses   Final diagnoses:  New onset seizure [facility])    ED Discharge Orders    None     11:45 AM Patient brought here for evaluation of altered mental status, and seizure-like activity last known normal 8 PM last night.  No known history of seizure activities but patient does have history of benzodiazepine dependency.  Patient does not have any focal neuro deficit on exam but she does appears anxious, squirming in the bed, and did bit her tongue.  No bowel bladder incontinence.  No sick symptoms.  Work-up initiated. Suspect stress induced seizure.   1:08 PM Labs remarkable for leukocytosis with WBC 18.8 likely stress demargination.  Potassium is low at 3.1, supplementation given.  Head CT scan unremarkable, normal CBG, normal UDS, pregnancy test is negative.  In the setting of witnessed first-time seizure, patient will need to follow-up closely with neurology for outpatient evaluation which will likely include EEG.  She cannot drive until she is cleared by neurologist. Low suspicion for stroke as sxs not consistent with stroke. Return precaution discussed.  Care discussed with Dr. Jeanell Sparrow.   Domenic Moras, PA-C 01/06/19 1318    Pattricia Boss, MD 01/06/19 [phone]

## 2019-01-06 NOTE — ED Triage Notes (Signed)
Pt accompanied by husband who reports pt had a witnessed seizure last night lasting about 12 mins, no hx of same. EMS evaluated pt and husband decided to wait til today to bring pt to UC, UC sent pt here with concerns of stroke. Pt having intermittent confusion. Pt alert and oriented to self and place at this time. Pt irritable. Pupils 5, equal and reactive.

## 2019-01-06 NOTE — Discharge Instructions (Addendum)
°  You have declined EMS transport.  Please have your husband drive you directly to the hospital for further evaluation of your symptoms.

## 2019-01-06 NOTE — Discharge Instructions (Signed)
Please call and follow up closely with neurologist for further evaluation of seizure activity.  Do not drive or operate machinery until cleared by neurologist.  Return if your condition worsen or if you have other concerns.

## 2019-01-06 NOTE — ED Triage Notes (Signed)
Pt here with husband today. Had seizure last night. EMS was called. Husband still concerned with her mental status, pt slurring words.

## 2019-01-08 DIAGNOSIS — F13939 Sedative, hypnotic or anxiolytic use, unspecified with withdrawal, unspecified: Secondary | ICD-10-CM | POA: Insufficient documentation

## 2019-01-17 ENCOUNTER — Ambulatory Visit: Payer: Self-pay | Admitting: Family Medicine

## 2019-01-24 ENCOUNTER — Telehealth: Payer: Self-pay | Admitting: Neurology

## 2019-01-24 NOTE — Telephone Encounter (Signed)
I have not received anything.

## 2019-01-24 NOTE — Telephone Encounter (Signed)
Patient made aware.

## 2019-01-24 NOTE — Telephone Encounter (Signed)
Patient left vm asking if we have received STD paperwork for completion since patient has been told she could not drive after possible seizure (ED notes in Epic).   Have you received paperwork?

## 2019-02-01 ENCOUNTER — Telehealth: Payer: Self-pay | Admitting: Neurology

## 2019-02-01 NOTE — Telephone Encounter (Signed)
Forms completed for [employer]. Faxed to company with confirmation received [phone]). Patient made aware. Copy of forms at the front for pick up. Patient aware she needs to fill out patient portion of forms and give to company. She expressed understanding.

## 2019-02-02 ENCOUNTER — Other Ambulatory Visit: Payer: Self-pay | Admitting: Physician Assistant

## 2019-02-02 NOTE — Progress Notes (Signed)
Pt came in with son for his appt. Updated med list. Pt has appt with neurologist on Friday. She continues to struggle with memory. EEG in hospital was negative for seizure. Suspected prescription drug withdrawal seizure.

## 2019-02-25 ENCOUNTER — Ambulatory Visit (INDEPENDENT_AMBULATORY_CARE_PROVIDER_SITE_OTHER): Payer: Managed Care, Other (non HMO) | Admitting: Physician Assistant

## 2019-02-25 ENCOUNTER — Encounter: Payer: Self-pay | Admitting: Physician Assistant

## 2019-02-25 ENCOUNTER — Other Ambulatory Visit: Payer: Self-pay

## 2019-02-25 VITALS — BP 137/100 | HR 103 | Wt 123.0 lb

## 2019-02-25 DIAGNOSIS — M5412 Radiculopathy, cervical region: Secondary | ICD-10-CM

## 2019-02-25 DIAGNOSIS — F331 Major depressive disorder, recurrent, moderate: Secondary | ICD-10-CM

## 2019-02-25 DIAGNOSIS — M542 Cervicalgia: Secondary | ICD-10-CM | POA: Diagnosis not present

## 2019-02-25 DIAGNOSIS — G5620 Lesion of ulnar nerve, unspecified upper limb: Secondary | ICD-10-CM | POA: Diagnosis not present

## 2019-02-25 DIAGNOSIS — R569 Unspecified convulsions: Secondary | ICD-10-CM

## 2019-02-25 DIAGNOSIS — K219 Gastro-esophageal reflux disease without esophagitis: Secondary | ICD-10-CM

## 2019-02-25 DIAGNOSIS — F19231 Other psychoactive substance dependence with withdrawal delirium: Secondary | ICD-10-CM

## 2019-02-25 DIAGNOSIS — B009 Herpesviral infection, unspecified: Secondary | ICD-10-CM | POA: Diagnosis not present

## 2019-02-25 DIAGNOSIS — R030 Elevated blood-pressure reading, without diagnosis of hypertension: Secondary | ICD-10-CM

## 2019-02-25 MED ORDER — FAMOTIDINE 20 MG PO TABS: 20.0000 mg | ORAL_TABLET | Freq: Two times a day (BID) | ORAL | 1 refills | Status: DC

## 2019-02-25 MED ORDER — DULOXETINE HCL 60 MG PO CPEP: 60.0000 mg | ORAL_CAPSULE | Freq: Every day | ORAL | 1 refills | Status: DC

## 2019-02-25 MED ORDER — VALACYCLOVIR HCL 500 MG PO TABS: ORAL_TABLET | ORAL | 5 refills | Status: DC
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
## 2019-02-28 ENCOUNTER — Encounter: Payer: Self-pay | Admitting: Physician Assistant

## 2019-02-28 DIAGNOSIS — R569 Unspecified convulsions: Secondary | ICD-10-CM | POA: Insufficient documentation

## 2019-02-28 DIAGNOSIS — M5412 Radiculopathy, cervical region: Secondary | ICD-10-CM | POA: Insufficient documentation

## 2019-02-28 DIAGNOSIS — F331 Major depressive disorder, recurrent, moderate: Secondary | ICD-10-CM | POA: Insufficient documentation

## 2019-02-28 DIAGNOSIS — G8929 Other chronic pain: Secondary | ICD-10-CM | POA: Insufficient documentation

## 2019-02-28 DIAGNOSIS — R030 Elevated blood-pressure reading, without diagnosis of hypertension: Secondary | ICD-10-CM | POA: Insufficient documentation

## 2019-02-28 DIAGNOSIS — K219 Gastro-esophageal reflux disease without esophagitis: Secondary | ICD-10-CM | POA: Insufficient documentation

## 2019-02-28 DIAGNOSIS — F19231 Other psychoactive substance dependence with withdrawal delirium: Secondary | ICD-10-CM | POA: Insufficient documentation

## 2019-02-28 DIAGNOSIS — F339 Major depressive disorder, recurrent, unspecified: Secondary | ICD-10-CM | POA: Insufficient documentation

## 2019-03-02 ENCOUNTER — Ambulatory Visit: Payer: Managed Care, Other (non HMO) | Admitting: Family Medicine

## 2019-03-15 ENCOUNTER — Ambulatory Visit (INDEPENDENT_AMBULATORY_CARE_PROVIDER_SITE_OTHER): Payer: Managed Care, Other (non HMO) | Admitting: Physician Assistant

## 2019-03-15 DIAGNOSIS — Z23 Encounter for immunization: Secondary | ICD-10-CM | POA: Diagnosis not present

## 2019-03-15 NOTE — Progress Notes (Signed)
Patient is here for a flu vaccine. Verified no previous allergy to flu vaccine, eggs, or latex. Flu injection to left deltoid with no apparent complications. Patient advised to call with any problems.   

## 2019-03-17 ENCOUNTER — Emergency Department (HOSPITAL_COMMUNITY): Payer: Managed Care, Other (non HMO) | Admitting: Certified Registered Nurse Anesthetist

## 2019-03-17 ENCOUNTER — Other Ambulatory Visit: Payer: Self-pay

## 2019-03-17 ENCOUNTER — Encounter (HOSPITAL_COMMUNITY): Payer: Self-pay | Admitting: Emergency Medicine

## 2019-03-17 ENCOUNTER — Emergency Department (INDEPENDENT_AMBULATORY_CARE_PROVIDER_SITE_OTHER)
Admission: EM | Admit: 2019-03-17 | Discharge: 2019-03-17 | Disposition: A | Discharge status: Home / Self Care | Payer: Managed Care, Other (non HMO) | Source: Home / Self Care

## 2019-03-17 ENCOUNTER — Emergency Department (INDEPENDENT_AMBULATORY_CARE_PROVIDER_SITE_OTHER): Payer: Managed Care, Other (non HMO)

## 2019-03-17 ENCOUNTER — Encounter (HOSPITAL_COMMUNITY)
Admission: EM | Disposition: A | Discharge status: Home / Self Care | Payer: Self-pay | Source: Home / Self Care | Attending: Emergency Medicine

## 2019-03-17 ENCOUNTER — Ambulatory Visit (HOSPITAL_COMMUNITY)
Admission: EM | Admit: 2019-03-17 | Discharge: 2019-03-17 | Disposition: A | Discharge status: Home / Self Care | Payer: Managed Care, Other (non HMO) | Attending: Emergency Medicine | Admitting: Emergency Medicine

## 2019-03-17 DIAGNOSIS — F329 Major depressive disorder, single episode, unspecified: Secondary | ICD-10-CM | POA: Insufficient documentation

## 2019-03-17 DIAGNOSIS — K8010 Calculus of gallbladder with chronic cholecystitis without obstruction: Secondary | ICD-10-CM | POA: Diagnosis not present

## 2019-03-17 DIAGNOSIS — R109 Unspecified abdominal pain: Secondary | ICD-10-CM | POA: Diagnosis present

## 2019-03-17 DIAGNOSIS — R1013 Epigastric pain: Secondary | ICD-10-CM

## 2019-03-17 DIAGNOSIS — F419 Anxiety disorder, unspecified: Secondary | ICD-10-CM | POA: Diagnosis not present

## 2019-03-17 DIAGNOSIS — Z87891 Personal history of nicotine dependence: Secondary | ICD-10-CM | POA: Diagnosis not present

## 2019-03-17 DIAGNOSIS — Z20828 Contact with and (suspected) exposure to other viral communicable diseases: Secondary | ICD-10-CM | POA: Insufficient documentation

## 2019-03-17 DIAGNOSIS — K8000 Calculus of gallbladder with acute cholecystitis without obstruction: Secondary | ICD-10-CM

## 2019-03-17 DIAGNOSIS — K810 Acute cholecystitis: Secondary | ICD-10-CM

## 2019-03-17 DIAGNOSIS — R569 Unspecified convulsions: Secondary | ICD-10-CM | POA: Insufficient documentation

## 2019-03-17 HISTORY — PX: CHOLECYSTECTOMY: SHX55

## 2019-03-17 LAB — POCT URINALYSIS DIP (MANUAL ENTRY)
Bilirubin, UA: NEGATIVE
Blood, UA: NEGATIVE
Glucose, UA: NEGATIVE mg/dL
Ketones, POC UA: NEGATIVE mg/dL
Leukocytes, UA: NEGATIVE
Nitrite, UA: NEGATIVE
Protein Ur, POC: NEGATIVE mg/dL
Spec Grav, UA: 1.015 (ref 1.010–1.025)
Urobilinogen, UA: 0.2 E.U./dL
pH, UA: 6.5 (ref 5.0–8.0)

## 2019-03-17 LAB — CBC
HCT: 40.8 % (ref 36.0–46.0)
Hemoglobin: 13.5 g/dL (ref 12.0–15.0)
MCH: 30.8 pg (ref 26.0–34.0)
MCHC: 33.1 g/dL (ref 30.0–36.0)
MCV: 93.2 fL (ref 80.0–100.0)
Platelets: 303 10*3/uL (ref 150–400)
RBC: 4.38 MIL/uL (ref 3.87–5.11)
RDW: 12.6 % (ref 11.5–15.5)
WBC: 9.6 10*3/uL (ref 4.0–10.5)
nRBC: 0 % (ref 0.0–0.2)

## 2019-03-17 LAB — COMPREHENSIVE METABOLIC PANEL
ALT: 41 U/L (ref 0–44)
AST: 35 U/L (ref 15–41)
Albumin: 4.5 g/dL (ref 3.5–5.0)
Alkaline Phosphatase: 50 U/L (ref 38–126)
Anion gap: 8 (ref 5–15)
BUN: 6 mg/dL (ref 6–20)
CO2: 28 mmol/L (ref 22–32)
Calcium: 9.1 mg/dL (ref 8.9–10.3)
Chloride: 102 mmol/L (ref 98–111)
Creatinine, Ser: 0.75 mg/dL (ref 0.44–1.00)
GFR calc Af Amer: >60 mL/min (ref >60)
GFR calc non Af Amer: >60 mL/min (ref >60)
Glucose, Bld: 107 mg/dL — ABNORMAL HIGH (ref 70–99)
Potassium: 3.3 mmol/L — ABNORMAL LOW (ref 3.5–5.1)
Sodium: 138 mmol/L (ref 135–145)
Total Bilirubin: 0.4 mg/dL (ref 0.3–1.2)
Total Protein: 6.8 g/dL (ref 6.5–8.1)

## 2019-03-17 LAB — POCT URINE PREGNANCY: Preg Test, Ur: NEGATIVE

## 2019-03-17 LAB — SARS CORONAVIRUS 2 BY RT PCR (HOSPITAL ORDER, PERFORMED IN ~~LOC~~ HOSPITAL LAB): SARS Coronavirus 2: NEGATIVE

## 2019-03-17 LAB — LIPASE, BLOOD: Lipase: 20 U/L (ref 11–51)

## 2019-03-17 SURGERY — LAPAROSCOPIC CHOLECYSTECTOMY
Anesthesia: General | Site: Abdomen

## 2019-03-17 MED ORDER — PROPOFOL 10 MG/ML IV BOLUS
INTRAVENOUS | Status: AC
  Filled 2019-03-17: qty 20

## 2019-03-17 MED ORDER — FENTANYL CITRATE (PF) 100 MCG/2ML IJ SOLN
INTRAMUSCULAR | Status: DC | PRN
  Administered 2019-03-17: 100 ug via INTRAVENOUS
  Administered 2019-03-17: 50 ug via INTRAVENOUS

## 2019-03-17 MED ORDER — CHLORHEXIDINE GLUCONATE CLOTH 2 % EX PADS: 6.0000 | MEDICATED_PAD | Freq: Once | CUTANEOUS | Status: DC

## 2019-03-17 MED ORDER — KETOROLAC TROMETHAMINE 30 MG/ML IJ SOLN
INTRAMUSCULAR | Status: DC | PRN
  Administered 2019-03-17: 30 mg via INTRAVENOUS

## 2019-03-17 MED ORDER — MIDAZOLAM HCL 5 MG/5ML IJ SOLN
INTRAMUSCULAR | Status: DC | PRN
  Administered 2019-03-17: 2 mg via INTRAVENOUS

## 2019-03-17 MED ORDER — LIDOCAINE 2% (20 MG/ML) 5 ML SYRINGE
INTRAMUSCULAR | Status: AC
  Filled 2019-03-17: qty 5

## 2019-03-17 MED ORDER — ONDANSETRON HCL 4 MG/2ML IJ SOLN
4.0000 mg | Freq: Four times a day (QID) | INTRAMUSCULAR | Status: DC | PRN
  Administered 2019-03-17: 4 mg via INTRAVENOUS
  Filled 2019-03-17: qty 2

## 2019-03-17 MED ORDER — SUCCINYLCHOLINE CHLORIDE 200 MG/10ML IV SOSY
PREFILLED_SYRINGE | INTRAVENOUS | Status: DC | PRN
  Administered 2019-03-17: 100 mg via INTRAVENOUS

## 2019-03-17 MED ORDER — MORPHINE SULFATE (PF) 2 MG/ML IV SOLN
1.0000 mg | INTRAVENOUS | Status: DC | PRN
  Administered 2019-03-17: 1 mg via INTRAVENOUS
  Filled 2019-03-17: qty 1

## 2019-03-17 MED ORDER — TRAMADOL HCL 50 MG PO TABS: 50.0000 mg | ORAL_TABLET | Freq: Four times a day (QID) | ORAL | 0 refills | Status: DC | PRN

## 2019-03-17 MED ORDER — BUPIVACAINE HCL (PF) 0.25 % IJ SOLN
INTRAMUSCULAR | Status: AC
  Filled 2019-03-17: qty 30

## 2019-03-17 MED ORDER — DEXAMETHASONE SODIUM PHOSPHATE 10 MG/ML IJ SOLN
INTRAMUSCULAR | Status: AC
  Filled 2019-03-17: qty 1

## 2019-03-17 MED ORDER — CELECOXIB 200 MG PO CAPS
200.0000 mg | ORAL_CAPSULE | ORAL | Status: DC
  Filled 2019-03-17: qty 1

## 2019-03-17 MED ORDER — LACTATED RINGERS IV SOLN
INTRAVENOUS | Status: DC
  Administered 2019-03-17: 16:00:00 via INTRAVENOUS

## 2019-03-17 MED ORDER — ONDANSETRON HCL 4 MG/2ML IJ SOLN
INTRAMUSCULAR | Status: DC | PRN
  Administered 2019-03-17: 4 mg via INTRAVENOUS

## 2019-03-17 MED ORDER — FENTANYL CITRATE (PF) 250 MCG/5ML IJ SOLN
INTRAMUSCULAR | Status: AC
  Filled 2019-03-17: qty 5

## 2019-03-17 MED ORDER — DIPHENHYDRAMINE HCL 50 MG/ML IJ SOLN
INTRAMUSCULAR | Status: DC | PRN
  Administered 2019-03-17: 25 mg via INTRAVENOUS

## 2019-03-17 MED ORDER — ALUM & MAG HYDROXIDE-SIMETH 200-200-20 MG/5ML PO SUSP
30.0000 mL | Freq: Once | ORAL | Status: AC
  Administered 2019-03-17: 20 mL via ORAL

## 2019-03-17 MED ORDER — PHENYLEPHRINE HCL (PRESSORS) 10 MG/ML IV SOLN
INTRAVENOUS | Status: DC | PRN
  Administered 2019-03-17: 120 ug via INTRAVENOUS

## 2019-03-17 MED ORDER — PROPOFOL 10 MG/ML IV BOLUS
INTRAVENOUS | Status: DC | PRN
  Administered 2019-03-17: 150 mg via INTRAVENOUS

## 2019-03-17 MED ORDER — FENTANYL CITRATE (PF) 100 MCG/2ML IJ SOLN: 25.0000 ug | INTRAMUSCULAR | Status: DC | PRN

## 2019-03-17 MED ORDER — 0.9 % SODIUM CHLORIDE (POUR BTL) OPTIME
TOPICAL | Status: DC | PRN
  Administered 2019-03-17: 17:00:00 1000 mL

## 2019-03-17 MED ORDER — PROMETHAZINE HCL 25 MG/ML IJ SOLN
INTRAMUSCULAR | Status: AC
  Filled 2019-03-17: qty 1

## 2019-03-17 MED ORDER — LIDOCAINE 2% (20 MG/ML) 5 ML SYRINGE
INTRAMUSCULAR | Status: DC | PRN
  Administered 2019-03-17: 6 mg via INTRAVENOUS

## 2019-03-17 MED ORDER — DEXAMETHASONE SODIUM PHOSPHATE 10 MG/ML IJ SOLN
INTRAMUSCULAR | Status: DC | PRN
  Administered 2019-03-17: 4 mg via INTRAVENOUS

## 2019-03-17 MED ORDER — BUPIVACAINE HCL 0.25 % IJ SOLN
INTRAMUSCULAR | Status: DC | PRN
  Administered 2019-03-17: 12 mL

## 2019-03-17 MED ORDER — MIDAZOLAM HCL 2 MG/2ML IJ SOLN
INTRAMUSCULAR | Status: AC
  Filled 2019-03-17: qty 2

## 2019-03-17 MED ORDER — ROCURONIUM BROMIDE 50 MG/5ML IV SOSY
PREFILLED_SYRINGE | INTRAVENOUS | Status: DC | PRN
  Administered 2019-03-17: 50 mg via INTRAVENOUS

## 2019-03-17 MED ORDER — PROMETHAZINE HCL 25 MG/ML IJ SOLN
6.2500 mg | INTRAMUSCULAR | Status: DC | PRN
  Administered 2019-03-17: 12.5 mg via INTRAVENOUS

## 2019-03-17 MED ORDER — SODIUM CHLORIDE 0.9 % IV SOLN
2.0000 g | INTRAVENOUS | Status: AC
  Administered 2019-03-17: 2 g via INTRAVENOUS
  Filled 2019-03-17: qty 20

## 2019-03-17 MED ORDER — ROCURONIUM BROMIDE 10 MG/ML (PF) SYRINGE
PREFILLED_SYRINGE | INTRAVENOUS | Status: AC
  Filled 2019-03-17: qty 10

## 2019-03-17 MED ORDER — ACETAMINOPHEN 500 MG PO TABS
1000.0000 mg | ORAL_TABLET | ORAL | Status: AC
  Administered 2019-03-17: 1000 mg via ORAL
  Filled 2019-03-17: qty 2

## 2019-03-17 MED ORDER — GABAPENTIN 300 MG PO CAPS
300.0000 mg | ORAL_CAPSULE | ORAL | Status: AC
  Administered 2019-03-17: 300 mg via ORAL
  Filled 2019-03-17: qty 1

## 2019-03-17 MED ORDER — SODIUM CHLORIDE 0.9 % IR SOLN
Status: DC | PRN
  Administered 2019-03-17: 1000 mL

## 2019-03-17 MED ORDER — ONDANSETRON HCL 4 MG/2ML IJ SOLN
INTRAMUSCULAR | Status: AC
  Filled 2019-03-17: qty 2

## 2019-03-17 MED ORDER — KETOROLAC TROMETHAMINE 30 MG/ML IJ SOLN
INTRAMUSCULAR | Status: AC
  Filled 2019-03-17: qty 1

## 2019-03-17 SURGICAL SUPPLY — 44 items
APPLIER CLIP 5 13 M/L LIGAMAX5 (MISCELLANEOUS)
CANISTER SUCT 3000ML PPV (MISCELLANEOUS) ×4 IMPLANT
CHLORAPREP W/TINT 26 (MISCELLANEOUS) ×4 IMPLANT
CLIP APPLIE 5 13 M/L LIGAMAX5 (MISCELLANEOUS) IMPLANT
CLIP VESOLOCK MED LG 6/CT (CLIP) ×8 IMPLANT
COVER MAYO STAND STRL (DRAPES) ×4 IMPLANT
COVER SURGICAL LIGHT HANDLE (MISCELLANEOUS) ×4 IMPLANT
COVER TRANSDUCER ULTRASND (DRAPES) ×4 IMPLANT
COVER WAND RF STERILE (DRAPES) ×4 IMPLANT
DEFOGGER SCOPE WARMER CLEARIFY (MISCELLANEOUS) IMPLANT
DERMABOND ADVANCED (GAUZE/BANDAGES/DRESSINGS) ×2
DERMABOND ADVANCED .7 DNX12 (GAUZE/BANDAGES/DRESSINGS) ×2 IMPLANT
DRAPE C-ARM 42X120 X-RAY (DRAPES) IMPLANT
ELECT REM PT RETURN 9FT ADLT (ELECTROSURGICAL) ×4
ELECTRODE REM PT RTRN 9FT ADLT (ELECTROSURGICAL) ×2 IMPLANT
GLOVE BIO SURGEON STRL SZ7.5 (GLOVE) ×4 IMPLANT
GOWN STRL REUS W/ TWL LRG LVL3 (GOWN DISPOSABLE) ×4 IMPLANT
GOWN STRL REUS W/ TWL XL LVL3 (GOWN DISPOSABLE) ×2 IMPLANT
GOWN STRL REUS W/TWL LRG LVL3 (GOWN DISPOSABLE) ×4
GOWN STRL REUS W/TWL XL LVL3 (GOWN DISPOSABLE) ×2
GRASPER SUT TROCAR 14GX15 (MISCELLANEOUS) ×4 IMPLANT
IV CATH 14GX2 1/4 (CATHETERS) ×4 IMPLANT
KIT BASIN OR (CUSTOM PROCEDURE TRAY) ×4 IMPLANT
KIT TURNOVER KIT B (KITS) ×4 IMPLANT
NEEDLE INSUFFLATION 14GA 120MM (NEEDLE) ×4 IMPLANT
NS IRRIG 1000ML POUR BTL (IV SOLUTION) ×4 IMPLANT
PAD ARMBOARD 7.5X6 YLW CONV (MISCELLANEOUS) ×8 IMPLANT
POUCH LAPAROSCOPIC INSTRUMENT (MISCELLANEOUS) ×4 IMPLANT
POUCH RETRIEVAL ECOSAC 10 (ENDOMECHANICALS) IMPLANT
POUCH RETRIEVAL ECOSAC 10MM (ENDOMECHANICALS)
SCISSORS LAP 5X35 DISP (ENDOMECHANICALS) ×4 IMPLANT
SET CHOLANGIOGRAPHY FRANKLIN (SET/KITS/TRAYS/PACK) ×4 IMPLANT
SET IRRIG TUBING LAPAROSCOPIC (IRRIGATION / IRRIGATOR) ×4 IMPLANT
SET TUBE SMOKE EVAC HIGH FLOW (TUBING) ×4 IMPLANT
SLEEVE ENDOPATH XCEL 5M (ENDOMECHANICALS) ×4 IMPLANT
SPECIMEN JAR SMALL (MISCELLANEOUS) ×4 IMPLANT
STOPCOCK 4 WAY LG BORE MALE ST (IV SETS) ×4 IMPLANT
SUT MNCRL AB 4-0 PS2 18 (SUTURE) ×4 IMPLANT
TOWEL GREEN STERILE (TOWEL DISPOSABLE) ×4 IMPLANT
TOWEL GREEN STERILE FF (TOWEL DISPOSABLE) ×4 IMPLANT
TRAY LAPAROSCOPIC MC (CUSTOM PROCEDURE TRAY) ×4 IMPLANT
TROCAR XCEL NON-BLD 11X100MML (ENDOMECHANICALS) ×4 IMPLANT
TROCAR XCEL NON-BLD 5MMX100MML (ENDOMECHANICALS) ×4 IMPLANT
WATER STERILE IRR 1000ML POUR (IV SOLUTION) ×4 IMPLANT

## 2019-03-17 NOTE — Anesthesia Preprocedure Evaluation (Signed)
Anesthesia Evaluation  Patient identified by MRN, date of birth, ID band Patient awake    Reviewed: Allergy & Precautions, NPO status , Patient's Chart, lab work & pertinent test results  Airway Mallampati: II  TM Distance: >3 FB Neck ROM: Full    Dental  (+) Teeth Intact, Dental Advisory Given, Implants,    Pulmonary neg pulmonary ROS, former smoker,    Pulmonary exam normal breath sounds clear to auscultation       Cardiovascular negative cardio ROS Normal cardiovascular exam Rhythm:Regular Rate:Normal     Neuro/Psych Seizures -,  PSYCHIATRIC DISORDERS Anxiety Depression    GI/Hepatic GERD  Medicated,ACUTE CHOLECYSTITIS   Endo/Other  negative endocrine ROS  Renal/GU negative Renal ROS     Musculoskeletal negative musculoskeletal ROS (+)   Abdominal   Peds  (+) ADHD Hematology negative hematology ROS (+)   Anesthesia Other Findings Day of surgery medications reviewed with the patient.  Reproductive/Obstetrics                             Anesthesia Physical Anesthesia Plan  ASA: II  Anesthesia Plan: General   Post-op Pain Management:    Induction: Intravenous, Rapid sequence and Cricoid pressure planned  PONV Risk Score and Plan: 4 or greater and Diphenhydramine, Midazolam, Dexamethasone and Ondansetron  Airway Management Planned: Oral ETT  Additional Equipment:   Intra-op Plan:   Post-operative Plan: Extubation in OR  Informed Consent: I have reviewed the patients History and Physical, chart, labs and discussed the procedure including the risks, benefits and alternatives for the proposed anesthesia with the patient or authorized representative who has indicated his/her understanding and acceptance.     Dental advisory given  Plan Discussed with: CRNA  Anesthesia Plan Comments:         Anesthesia Quick Evaluation

## 2019-03-17 NOTE — Discharge Instructions (Addendum)
[facility]   LAPAROSCOPIC SURGERY: POST OP INSTRUCTIONS Always review your discharge instruction sheet given to you by the facility where your surgery was performed. IF YOU HAVE DISABILITY OR FAMILY LEAVE FORMS, YOU MUST BRING THEM TO THE OFFICE FOR PROCESSING.   DO NOT GIVE THEM TO YOUR DOCTOR.  PAIN CONTROL  1. First take acetaminophen (Tylenol) AND/or ibuprofen (Advil) to control your pain after surgery.  Follow directions on package.  Taking acetaminophen (Tylenol) and/or ibuprofen (Advil) regularly after surgery will help to control your pain and lower the amount of prescription pain medication you may need.  You should not take more than 4,000 mg (4 grams) of acetaminophen (Tylenol) in 24 hours.  You should not take ibuprofen (Advil), aleve, motrin, naprosyn or other NSAIDS if you have a history of stomach ulcers or chronic kidney disease.  2. A prescription for pain medication may be given to you upon discharge.  Take your pain medication as prescribed, if you still have uncontrolled pain after taking acetaminophen (Tylenol) or ibuprofen (Advil). 3. Use ice packs to help control pain. 4. If you need a refill on your pain medication, please contact your pharmacy.  They will contact our office to request authorization. Prescriptions will not be filled after 5pm or on week-ends.  HOME MEDICATIONS 5. Take your usually prescribed medications unless otherwise directed.  DIET 6. You should follow a light diet the first few days after arrival home.  Be sure to include lots of fluids daily. Avoid fatty, fried foods.   CONSTIPATION 7. It is common to experience some constipation after surgery and if you are taking pain medication.  Increasing fluid intake and taking a stool softener (such as Colace) will usually help or prevent this problem from occurring.  A mild laxative (Milk of Magnesia or Miralax) should be taken according to package instructions if there are no bowel  movements after 48 hours.  WOUND/INCISION CARE 8. Most patients will experience some swelling and bruising in the area of the incisions.  Ice packs will help.  Swelling and bruising can take several days to resolve.  9. Unless discharge instructions indicate otherwise, follow guidelines below  a. STERI-STRIPS - you may remove your outer bandages 48 hours after surgery, and you may shower at that time.  You have steri-strips (small skin tapes) in place directly over the incision.  These strips should be left on the skin for 7-10 days.   b. DERMABOND/SKIN GLUE - you may shower in 24 hours.  The glue will flake off over the next 2-3 weeks. 10. Any sutures or staples will be removed at the office during your follow-up visit.  ACTIVITIES 11. You may resume regular (light) daily activities beginning the next day--such as daily self-care, walking, climbing stairs--gradually increasing activities as tolerated.  You may have sexual intercourse when it is comfortable.  Refrain from any heavy lifting or straining until approved by your doctor. a. You may drive when you are no longer taking prescription pain medication, you can comfortably wear a seatbelt, and you can safely maneuver your car and apply brakes.  FOLLOW-UP 12. You should see your doctor in the office for a follow-up appointment approximately 2-3 weeks after your surgery.  You should have been given your post-op/follow-up appointment when your surgery was scheduled.  If you did not receive a post-op/follow-up appointment, make sure that you call for this appointment within a day or two after you arrive home to insure a convenient appointment time.  OTHER INSTRUCTIONS  WHEN TO CALL YOUR DOCTOR: 1. Fever over 101.0 2. Inability to urinate 3. Continued bleeding from incision. 4. Increased pain, redness, or drainage from the incision. 5. Increasing abdominal pain  The clinic staff is available to answer your questions during regular business  hours.  Please dont hesitate to call and ask to speak to one of the nurses for clinical concerns.  If you have a medical emergency, go to the nearest emergency room or call 911.  A surgeon from [facility] is always on call at the hospital. [address], [place], [place], [place]  [address] ? [address], [place], [place]   [phone] ? [phone] ? FAX [phone]

## 2019-03-17 NOTE — Anesthesia Postprocedure Evaluation (Signed)
Anesthesia Post Note  Patient: Annette Cook  Procedure(s) Performed: LAPAROSCOPIC CHOLECYSTECTOMY (N/A Abdomen)     Patient location during evaluation: PACU Anesthesia Type: General Level of consciousness: awake and alert Pain management: pain level controlled Vital Signs Assessment: post-procedure vital signs reviewed and stable Respiratory status: spontaneous breathing, nonlabored ventilation, respiratory function stable and patient connected to nasal cannula oxygen Cardiovascular status: blood pressure returned to baseline and stable Postop Assessment: no apparent nausea or vomiting Anesthetic complications: no    Last Vitals:  Vitals:   03/17/19 1806 03/17/19 1821  BP: (!) 131/95 129/82  Pulse: 73 93  Resp: 13 14  Temp:  36.7 C  SpO2: 100% 100%    Last Pain:  Vitals:   03/17/19 1751  TempSrc:   PainSc: 0-No pain                 Catalina Gravel

## 2019-03-17 NOTE — ED Triage Notes (Addendum)
Pt c/o abd pain that started last night. Pt thought she was constipated so she used a suppository. Vomited once at 11pm last night. No vomiting today. No diarrhea. Started menses yesterday.

## 2019-03-17 NOTE — Transfer of Care (Signed)
Immediate Anesthesia Transfer of Care Note  Patient: Annette Cook  Procedure(s) Performed: LAPAROSCOPIC CHOLECYSTECTOMY (N/A Abdomen)  Patient Location: PACU  Anesthesia Type:General  Level of Consciousness: awake, alert  and oriented  Airway & Oxygen Therapy: Patient Spontanous Breathing  Post-op Assessment: Report given to RN and Post -op Vital signs reviewed and stable  Post vital signs: Reviewed and stable  Last Vitals:  Vitals Value Taken Time  BP 124/80 03/17/19 1751  Temp    Pulse 97 03/17/19 1752  Resp 17 03/17/19 1752  SpO2 99 % 03/17/19 1752  Vitals shown include unvalidated device data.  Last Pain:  Vitals:   03/17/19 1607  TempSrc:   PainSc: 8          Complications: No apparent anesthesia complications

## 2019-03-17 NOTE — H&P (Signed)
[facility] Admission Note  Annette Cook [birth year]  [id number].    Requesting MD: Veryl Speak Chief Complaint/Reason for Consult: acute cholecystitis   HPI:  Annette Cook is a 33yo female who presented to [facility] from [facility] with abdominal pain. Patient states that it started last night around 1900 after eating pizza for lunch. Pain is epigastric and radiates into RUQ. Associated with nausea and vomiting. Initially thought she was constipated but tried a suppository without relief. She has never had pain like this before. She had pain all throughout the night so she went to urgent care this morning. U/s showed gallstones with the largest 1.3cm, borderline gallbladder wall thickening, prominence of the common bile duct 64mm. WBC 10.8. CMP and lipase pending. General surgery asked to see.  Abdominal surgical history: appendectomy, tummy tuck  ROS: Review of Systems  Constitutional: Negative.   HENT: Negative.   Eyes: Negative.   Respiratory: Negative.   Cardiovascular: Negative.   Gastrointestinal: Positive for abdominal pain, nausea and vomiting. Negative for constipation and diarrhea.  Genitourinary: Negative.   Musculoskeletal: Negative.   Skin: Negative.   Neurological: Negative.     All systems reviewed and otherwise negative except for as above  Family History  Problem Relation Age of Onset  . Hypertension Mother   . Stroke Mother     Past Medical History:  Diagnosis Date  . ADHD (attention deficit hyperactivity disorder)   . Anxiety   . GERD (gastroesophageal reflux disease)   . Insomnia     Past Surgical History:  Procedure Laterality Date  . APPENDECTOMY    . TONSILLECTOMY AND ADENOIDECTOMY    . WISDOM TOOTH EXTRACTION      Social History:  reports that she quit smoking about 14 years ago. Her smoking use included cigarettes. She has a 7.00 pack-year smoking history. She has never used smokeless tobacco. She reports current  alcohol use. She reports that she does not use drugs.  Allergies: No Known Allergies  (Not in a hospital admission)   Prior to Admission medications   Medication Sig Start Date End Date Taking? Authorizing Provider  diclofenac sodium (VOLTAREN) 1 % GEL Apply 2 g topically 4 (four) times daily. To affected joint. Patient not taking: Reported on 02/25/2019 07/20/17   Gregor Hams, MD  DULoxetine (CYMBALTA) 60 MG capsule Take 1 capsule (60 mg total) by mouth daily. 02/25/19   Breeback, Jade L, PA-C  famotidine (PEPCID) 20 MG tablet Take 1 tablet (20 mg total) by mouth 2 (two) times daily. 02/25/19   Breeback, Jade L, PA-C  hydrOXYzine (ATARAX/VISTARIL) 50 MG tablet Take 1 tablet (50 mg total) by mouth every 8 (eight) hours as needed for anxiety. 01/18/18   Breeback, Royetta Car, PA-C  Oxcarbazepine (TRILEPTAL) 300 MG tablet Take 300 mg by mouth 2 (two) times daily. 02/11/19   [provider]  valACYclovir (VALTREX) 500 MG tablet TAKE 1 TABLET BY MOUTH EVERYDAY AT BEDTIME 02/25/19   Breeback, Jade L, PA-C    Blood pressure 131/88, pulse (!) 103, temperature 98 F (36.7 C), temperature source Oral, resp. rate 18, last menstrual period 03/16/2019, SpO2 99 %, currently breastfeeding. Physical Exam: General: pleasant, WD/WN white female who is sitting up in bed in NAD HEENT: head is normocephalic, atraumatic.  Sclera are noninjected.  Pupils equal and round.  Ears and nose without any masses or lesions.  Mouth is pink and moist. Dentition fair Heart: regular, rate, and rhythm.  No obvious murmurs, gallops,  or rubs noted.  Palpable pedal pulses bilaterally Lungs: CTAB, no wheezes, rhonchi, or rales noted.  Respiratory effort nonlabored Abd: soft, ND, +BS, no masses, hernias, or organomegaly. Mild epigastric/RUQ TTP MS: calves soft and nontender Skin: warm and dry with no masses, lesions, or rashes Psych: A&Ox3 with an appropriate affect. Neuro: cranial nerves grossly intact, extremity CSM  intact bilaterally, normal speech  Results for orders placed or performed during the hospital encounter of 03/17/19 (from the past 48 hour(s))  POCT urinalysis dipstick     Status: None   Collection Time: 03/17/19 10:23 AM  Result Value Ref Range   Color, UA yellow yellow   Clarity, UA clear clear   Glucose, UA negative negative mg/dL   Bilirubin, UA negative negative   Ketones, POC UA negative negative mg/dL   Spec Grav, UA 1.015 1.010 - 1.025   Blood, UA negative negative   pH, UA 6.5 5.0 - 8.0   Protein Ur, POC negative negative mg/dL   Urobilinogen, UA 0.2 0.2 or 1.0 E.U./dL   Nitrite, UA Negative Negative   Leukocytes, UA Negative Negative  POCT urine pregnancy     Status: None   Collection Time: 03/17/19 10:25 AM  Result Value Ref Range   Preg Test, Ur Negative Negative   US Abdomen Complete  Result Date: 03/17/2019 CLINICAL DATA:  Epigastric/upper abdominal pain EXAM: ABDOMEN ULTRASOUND COMPLETE COMPARISON:  None. FINDINGS: Gallbladder: Within the gallbladder, there are echogenic foci which move and shadow consistent with cholelithiasis. Largest gallstone measures 1.3 cm in length. The gallbladder wall is borderline thickened. There is no appreciable pericholecystic fluid. Patient is focally tender over the gallbladder. Common bile duct: Diameter: 8 mm, prominent. More distally, the common bile duct is obscured by gas. No intrahepatic biliary duct dilatation. Liver: No focal lesion identified. Within normal limits in parenchymal echogenicity. Portal vein is patent on color Doppler imaging with normal direction of blood flow towards the liver. IVC: No abnormality visualized. Pancreas: No pancreatic mass or inflammatory focus. Spleen: Size and appearance within normal limits. Right Kidney: Length: 9.8 cm. Echogenicity within normal limits. No mass or hydronephrosis visualized. Left Kidney: Length: 10.1 cm. Echogenicity within normal limits. No mass or hydronephrosis visualized.  Abdominal aorta: No aneurysm visualized. Other findings: No demonstrable ascites. IMPRESSION: 1. Cholelithiasis with borderline gallbladder wall thickening. Patient is focally tender over the gallbladder. These findings are concerning for potential acute cholecystitis. This finding may warrant correlation with nuclear medicine hepatobiliary imaging study to assess for cystic duct patency. 2. Prominence of the common bile duct. More distally, the common bile duct is obscured by gas. From an imaging standpoint, MRCP would be the imaging study of choice to further evaluate the biliary ductal system. 3.  Study otherwise unremarkable. These results will be called to the ordering clinician or representative by the Radiologist Assistant, and communication documented in the PACS or zVision Dashboard. Electronically Signed   By: Lowella Grip III M.D.   On: 03/17/2019 11:20      Assessment/Plan  Cholelithiasis Early acute cholecystitis - Plan for laparoscopic cholecystectomy today pending covid test. Lab work is also pending. May be able to go home from PACU postop.   Wellington Hampshire, [facility] 03/17/2019, 2:28 PM Please see Amion for pager number during day hours 7:00am-4:30pm

## 2019-03-17 NOTE — Op Note (Signed)
03/17/2019  5:36 PM  PATIENT:  Annette Cook  33 y.o. female  PRE-OPERATIVE DIAGNOSIS:  ACUTE CHOLECYSTITIS  POST-OPERATIVE DIAGNOSIS:  ACUTE CHOLECYSTITIS  PROCEDURE:  Procedure(s): LAPAROSCOPIC CHOLECYSTECTOMY (N/A)  SURGEON:  Surgeon(s) and Role:    Ralene Ok, MD - Primary  ANESTHESIA:   local and general  EBL:  minimal   BLOOD ADMINISTERED:none  DRAINS: none   LOCAL MEDICATIONS USED:  BUPIVICAINE   SPECIMEN:  Source of Specimen:  gallbladder  DISPOSITION OF SPECIMEN:  PATHOLOGY  COUNTS:  YES  TOURNIQUET:  * No tourniquets in log *  DICTATION: .Dragon Dictation   EBL: [id number]   Complications: none   Counts: reported as correct x 2   Findings:chronic inflammation and gallstones  Indications for procedure: Pt is a 1F with RUQ pain and seen to have gallstones.   Details of the procedure: The patient was taken to the operating and placed in the supine position with bilateral SCDs in place. A time out was called and all facts were verified. A pneumoperitoneum was obtained via A Veress needle technique to a pressure of 37mm of mercury. A 53mm trochar was then placed in the right upper quadrant under visualization, and there were no injuries to any abdominal organs. A 11 mm port was then placed in the umbilical region after infiltrating with local anesthesia under direct visualization. A second epigastric port was placed under direct visualization.   The gallbladder was identified and retracted, the peritoneum was then sharply dissected from the gallbladder and this dissection was carried down to Calot's triangle. The cystic duct was identified and dissected circumferentially and seen going into the gallbladder 360.  The cystic artery was dissected away from the surrounding tissues.   The critical angle was obtained.  2 clips were placed proximally one distally and the cystic duct transected. The cystic artery was identified and 2 clips placed proximally and one  distally and transected. We then proceeded to remove the gallbladder off the hepatic fossa with Bovie cautery. A retrieval bag was then placed in the abdomen and gallbladder placed in the bag. The hepatic fossa was then reexamined and hemostasis was achieved with Bovie cautery and was excellent at this portion of the case. The subhepatic fossa and perihepatic fossa was then irrigated until the effluent was clear. The specimen bag and specimen were removed from the abdominal cavity.  The 11 mm trocar fascia was reapproximated with the Endo Close #1 Vicryl x2. The pneumoperitoneum was evacuated and all trochars removed under direct visulalization. The skin was then closed with 4-0 Monocryl and the skin dressed with Dermabond. The patient was awaken from general anesthesia and taken to the recovery room in stable condition.   PLAN OF CARE: Discharge to home after PACU  PATIENT DISPOSITION:  PACU - hemodynamically stable.   Delay start of Pharmacological VTE agent (>24hrs) due to surgical blood loss or risk of bleeding: not applicable

## 2019-03-17 NOTE — Anesthesia Procedure Notes (Addendum)
Procedure Name: Intubation Date/Time: 03/17/2019 4:52 PM Performed by: Catalina Gravel, MD Pre-anesthesia Checklist: Patient identified, Emergency Drugs available, Suction available and Patient being monitored Patient Re-evaluated:Patient Re-evaluated prior to induction Oxygen Delivery Method: Circle system utilized Preoxygenation: Pre-oxygenation with 100% oxygen Induction Type: IV induction Ventilation: Mask ventilation without difficulty Laryngoscope Size: Mac and 3 Tube type: Oral Tube size: 7.0 mm Number of attempts: 1 Airway Equipment and Method: Stylet and Oral airway Placement Confirmation: ETT inserted through vocal cords under direct vision,  positive ETCO2 and breath sounds checked- equal and bilateral Secured at: 22 cm Tube secured with: Tape Dental Injury: Teeth and Oropharynx as per pre-operative assessment  Comments: Intubation by Dr. Gifford Shave

## 2019-03-17 NOTE — ED Provider Notes (Signed)
[facility] DEPARTMENT Provider Note   CSN: [id number] Arrival date & time: 03/17/19  1311     History   Chief Complaint Chief Complaint  Patient presents with   Abdominal Pain    HPI Annette Cook is a 33 y.o. female.     Patient is a 33 year old female with history of ADHD, anxiety, GERD, and seizures.  She presents today for evaluation of abdominal pain.  Patient started last night with pain to her epigastrium and right upper quadrant.  She felt like she was constipated and tried a suppository with no relief.  She went to the ER urgent care at [facility] this morning and had an ultrasound performed.  This showed cholelithiasis with borderline dilatation of the common bile duct.  She had a sonographic Murphy's sign, then was referred here for surgical consultation.  Patient denies fevers or chills.  She denies bloody stool or vomit.  The history is provided by the patient.  Abdominal Pain Pain location:  Epigastric and RUQ Pain quality: cramping   Pain radiates to:  Does not radiate Pain severity:  Moderate Onset quality:  Sudden Duration:  12 hours Timing:  Constant Progression:  Worsening Chronicity:  New Relieved by:  Movement and palpation Worsened by:  Nothing   Past Medical History:  Diagnosis Date   ADHD (attention deficit hyperactivity disorder)    Anxiety    GERD (gastroesophageal reflux disease)    Insomnia     Patient Active Problem List   Diagnosis Date Noted   Right cervical radiculopathy 02/28/2019   Drug withdrawal seizure with delirium ([place]) 02/28/2019   Elevated blood pressure reading 02/28/2019   Gastroesophageal reflux disease 02/28/2019   Moderate episode of recurrent major depressive disorder ([place]) 02/28/2019   PTSD (post-traumatic stress disorder) 11/12/2018   Anxiety 11/12/2018   Neck pain 11/12/2018   Acute bilateral low back pain without sciatica 11/12/2018   Overactive bladder 01/21/2018     Adult ADHD 04/10/2017   Opioid use agreement exists 10/24/2016   Encounter for chronic pain management 10/24/2016   Takes daily multivitamins 09/12/2016   Ulnar nerve entrapment 09/12/2016   Anxiety and depression 09/03/2015   Patellofemoral pain syndrome 04/12/2015   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 03/15/2015   Acute stress reaction 07/08/2013   Generalized anxiety disorder 10/22/2012   Insomnia 10/22/2012   Panic attack 10/22/2012   ADHD (attention deficit hyperactivity disorder) 10/22/2012   HSV infection 12/02/2011    Past Surgical History:  Procedure Laterality Date   APPENDECTOMY     TONSILLECTOMY AND ADENOIDECTOMY     WISDOM TOOTH EXTRACTION       OB History    Gravida  3   Para  3   Term  3   Preterm      AB      Living  3     SAB      TAB      Ectopic      Multiple  0   Live Births  3            Home Medications    Prior to Admission medications   Medication Sig Start Date End Date Taking? Authorizing Provider  diclofenac sodium (VOLTAREN) 1 % GEL Apply 2 g topically 4 (four) times daily. To affected joint. Patient not taking: Reported on 02/25/2019 07/20/17   Gregor Hams, MD  DULoxetine (CYMBALTA) 60 MG capsule Take 1 capsule (60 mg total) by mouth daily. 02/25/19  Breeback, Jade L, PA-C  famotidine (PEPCID) 20 MG tablet Take 1 tablet (20 mg total) by mouth 2 (two) times daily. 02/25/19   Breeback, Jade L, PA-C  hydrOXYzine (ATARAX/VISTARIL) 50 MG tablet Take 1 tablet (50 mg total) by mouth every 8 (eight) hours as needed for anxiety. 01/18/18   Breeback, Royetta Car, PA-C  Oxcarbazepine (TRILEPTAL) 300 MG tablet Take 300 mg by mouth 2 (two) times daily. 02/11/19   [provider]  valACYclovir (VALTREX) 500 MG tablet TAKE 1 TABLET BY MOUTH EVERYDAY AT BEDTIME 02/25/19   Breeback, Royetta Car, PA-C    Family History Family History  Problem Relation Age of Onset   Hypertension Mother    Stroke Mother      Social History Social History   Tobacco Use   Smoking status: Former Smoker    Packs/day: 1.00    Years: 7.00    Pack years: 7.00    Types: Cigarettes    Quit date: 10/22/2004    Years since quitting: 14.4   Smokeless tobacco: Never Used  Substance Use Topics   Alcohol use: Yes    Comment: socially   Drug use: No     Allergies   Patient has no known allergies.   Review of Systems Review of Systems  Gastrointestinal: Positive for abdominal pain.  All other systems reviewed and are negative.    Physical Exam Updated Vital Signs BP 131/88 (BP Location: Right Arm)    Pulse (!) 103    Temp 98 F (36.7 C) (Oral)    Resp 18    LMP 03/16/2019 (Exact Date)    SpO2 99%   Physical Exam Vitals signs and nursing note reviewed.  Constitutional:      General: She is not in acute distress.    Appearance: She is well-developed. She is not diaphoretic.  HENT:     Head: Normocephalic and atraumatic.  Neck:     Musculoskeletal: Normal range of motion and neck supple.  Cardiovascular:     Rate and Rhythm: Normal rate and regular rhythm.     Heart sounds: No murmur. No friction rub. No gallop.   Pulmonary:     Effort: Pulmonary effort is normal. No respiratory distress.     Breath sounds: Normal breath sounds. No wheezing.  Abdominal:     General: Bowel sounds are normal. There is no distension.     Palpations: Abdomen is soft.     Tenderness: There is abdominal tenderness in the right upper quadrant and epigastric area. There is no right CVA tenderness, left CVA tenderness, guarding or rebound.  Musculoskeletal: Normal range of motion.  Skin:    General: Skin is warm and dry.  Neurological:     Mental Status: She is alert and oriented to person, place, and time.      ED Treatments / Results  Labs (all labs ordered are listed, but only abnormal results are displayed) Labs Reviewed - No data to display  EKG None  Radiology US Abdomen Complete  Result Date:  03/17/2019 CLINICAL DATA:  Epigastric/upper abdominal pain EXAM: ABDOMEN ULTRASOUND COMPLETE COMPARISON:  None. FINDINGS: Gallbladder: Within the gallbladder, there are echogenic foci which move and shadow consistent with cholelithiasis. Largest gallstone measures 1.3 cm in length. The gallbladder wall is borderline thickened. There is no appreciable pericholecystic fluid. Patient is focally tender over the gallbladder. Common bile duct: Diameter: 8 mm, prominent. More distally, the common bile duct is obscured by gas. No intrahepatic biliary duct dilatation. Liver: No  focal lesion identified. Within normal limits in parenchymal echogenicity. Portal vein is patent on color Doppler imaging with normal direction of blood flow towards the liver. IVC: No abnormality visualized. Pancreas: No pancreatic mass or inflammatory focus. Spleen: Size and appearance within normal limits. Right Kidney: Length: 9.8 cm. Echogenicity within normal limits. No mass or hydronephrosis visualized. Left Kidney: Length: 10.1 cm. Echogenicity within normal limits. No mass or hydronephrosis visualized. Abdominal aorta: No aneurysm visualized. Other findings: No demonstrable ascites. IMPRESSION: 1. Cholelithiasis with borderline gallbladder wall thickening. Patient is focally tender over the gallbladder. These findings are concerning for potential acute cholecystitis. This finding may warrant correlation with nuclear medicine hepatobiliary imaging study to assess for cystic duct patency. 2. Prominence of the common bile duct. More distally, the common bile duct is obscured by gas. From an imaging standpoint, MRCP would be the imaging study of choice to further evaluate the biliary ductal system. 3.  Study otherwise unremarkable. These results will be called to the ordering clinician or representative by the Radiologist Assistant, and communication documented in the PACS or zVision Dashboard. Electronically Signed   By: Lowella Grip III  M.D.   On: 03/17/2019 11:20    Procedures Procedures (including critical care time)  Medications Ordered in ED Medications - No data to display   Initial Impression / Assessment and Plan / ED Course  I have reviewed the triage vital signs and the nursing notes.  Pertinent labs & imaging results that were available during my care of the patient were reviewed by me and considered in my medical decision making (see chart for details).  Patient seen by general surgery and will undergo a laparoscopic cholecystectomy this afternoon.  Dr. Rosendo Gros to admit.  Final Clinical Impressions(s) / ED Diagnoses   Final diagnoses:  None    ED Discharge Orders    None       Veryl Speak, MD 03/17/19 1536

## 2019-03-17 NOTE — ED Triage Notes (Signed)
Pt sent from [facility] for RUQ pain since last night with nausea and vomiting.

## 2019-03-17 NOTE — ED Provider Notes (Signed)
[facility] C[facility]    CSN: [id number] Arrival date & time: 03/17/19  [id number]      History   Chief Complaint Chief Complaint  Patient presents with   Abdominal Pain    HPI Annette Cook is a 33 y.o. female.   The history is provided by the patient. No language interpreter was used.  Abdominal Pain Pain location:  Epigastric Pain quality: aching, sharp and stabbing   Pain radiates to:  Does not radiate Pain severity:  Severe Onset quality:  Gradual Duration:  1 day Timing:  Constant Progression:  Worsening Chronicity:  New Context: not sick contacts   Relieved by:  Nothing Worsened by:  Nothing Ineffective treatments:  None tried Associated symptoms: nausea   Associated symptoms: no anorexia   Pt complains of severe epigastric pain.  Pt has been taking ibuprofen for neck pain   Past Medical History:  Diagnosis Date   ADHD (attention deficit hyperactivity disorder)    Anxiety    GERD (gastroesophageal reflux disease)    Insomnia     Patient Active Problem List   Diagnosis Date Noted   Right cervical radiculopathy 02/28/2019   Drug withdrawal seizure with delirium ([place]) 02/28/2019   Elevated blood pressure reading 02/28/2019   Gastroesophageal reflux disease 02/28/2019   Moderate episode of recurrent major depressive disorder ([place]) 02/28/2019   PTSD (post-traumatic stress disorder) 11/12/2018   Anxiety 11/12/2018   Neck pain 11/12/2018   Acute bilateral low back pain without sciatica 11/12/2018   Overactive bladder 01/21/2018   Adult ADHD 04/10/2017   Opioid use agreement exists 10/24/2016   Encounter for chronic pain management 10/24/2016   Takes daily multivitamins 09/12/2016   Ulnar nerve entrapment 09/12/2016   Anxiety and depression 09/03/2015   Patellofemoral pain syndrome 04/12/2015   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 03/15/2015   Acute stress reaction 07/08/2013   Generalized anxiety disorder 10/22/2012    Insomnia 10/22/2012   Panic attack 10/22/2012   ADHD (attention deficit hyperactivity disorder) 10/22/2012   HSV infection 12/02/2011    Past Surgical History:  Procedure Laterality Date   APPENDECTOMY     TONSILLECTOMY AND ADENOIDECTOMY     WISDOM TOOTH EXTRACTION      OB History    Gravida  3   Para  3   Term  3   Preterm      AB      Living  3     SAB      TAB      Ectopic      Multiple  0   Live Births  3            Home Medications    Prior to Admission medications   Medication Sig Start Date End Date Taking? Authorizing Provider  diclofenac sodium (VOLTAREN) 1 % GEL Apply 2 g topically 4 (four) times daily. To affected joint. Patient not taking: Reported on 02/25/2019 07/20/17   Gregor Hams, MD  DULoxetine (CYMBALTA) 60 MG capsule Take 1 capsule (60 mg total) by mouth daily. 02/25/19   Breeback, Jade L, PA-C  famotidine (PEPCID) 20 MG tablet Take 1 tablet (20 mg total) by mouth 2 (two) times daily. 02/25/19   Breeback, Jade L, PA-C  hydrOXYzine (ATARAX/VISTARIL) 50 MG tablet Take 1 tablet (50 mg total) by mouth every 8 (eight) hours as needed for anxiety. 01/18/18   Breeback, Royetta Car, PA-C  Oxcarbazepine (TRILEPTAL) 300 MG tablet Take 300 mg by mouth 2 (two)  times daily. 02/11/19   [provider]  valACYclovir (VALTREX) 500 MG tablet TAKE 1 TABLET BY MOUTH EVERYDAY AT BEDTIME 02/25/19   Breeback, Royetta Car, PA-C    Family History Family History  Problem Relation Age of Onset   Hypertension Mother    Stroke Mother     Social History Social History   Tobacco Use   Smoking status: Former Smoker    Packs/day: 1.00    Years: 7.00    Pack years: 7.00    Types: Cigarettes    Quit date: 10/22/2004    Years since quitting: 14.4   Smokeless tobacco: Never Used  Substance Use Topics   Alcohol use: Yes    Comment: socially   Drug use: No     Allergies   Patient has no known allergies.   Review of Systems Review of  Systems  Gastrointestinal: Positive for abdominal pain and nausea. Negative for anorexia.  All other systems reviewed and are negative.    Physical Exam Triage Vital Signs ED Triage Vitals  Enc Vitals Group     BP 03/17/19 0936 125/85     Pulse Rate 03/17/19 0936 94     Resp 03/17/19 0936 18     Temp 03/17/19 0936 98.5 F (36.9 C)     Temp Source 03/17/19 0936 Oral     SpO2 03/17/19 0936 97 %     Weight 03/17/19 0938 121 lb 4.1 oz (55 kg)     Height --      Head Circumference --      Peak Flow --      Pain Score 03/17/19 0937 9     Pain Loc --      Pain Edu? --      Excl. in [place]? --    No data found.  Updated Vital Signs BP 125/85 (BP Location: Right Arm)    Pulse 94    Temp 98.5 F (36.9 C) (Oral)    Resp 18    Wt 55 kg    LMP 03/16/2019 (Exact Date)    SpO2 97%    BMI 22.91 kg/m   Visual Acuity Right Eye Distance:   Left Eye Distance:   Bilateral Distance:    Right Eye Near:   Left Eye Near:    Bilateral Near:     Physical Exam Vitals signs and nursing note reviewed.  Constitutional:      Appearance: She is well-developed.  HENT:     Head: Normocephalic.  Eyes:     Extraocular Movements: Extraocular movements intact.  Neck:     Musculoskeletal: Normal range of motion.  Cardiovascular:     Rate and Rhythm: Normal rate.  Pulmonary:     Effort: Pulmonary effort is normal.  Abdominal:     General: Bowel sounds are normal. There is no distension.     Tenderness: There is abdominal tenderness in the right upper quadrant and epigastric area.  Musculoskeletal: Normal range of motion.  Skin:    General: Skin is warm.  Neurological:     Mental Status: She is alert and oriented to person, place, and time.  Psychiatric:        Mood and Affect: Mood normal.      UC Treatments / Results  Labs (all labs ordered are listed, but only abnormal results are displayed) Labs Reviewed  COMPLETE METABOLIC PANEL WITH GFR  CBC WITH DIFFERENTIAL/PLATELET  LIPASE    POCT URINALYSIS DIP (MANUAL ENTRY)  POCT URINE PREGNANCY  EKG   Radiology US Abdomen Complete  Result Date: 03/17/2019 CLINICAL DATA:  Epigastric/upper abdominal pain EXAM: ABDOMEN ULTRASOUND COMPLETE COMPARISON:  None. FINDINGS: Gallbladder: Within the gallbladder, there are echogenic foci which move and shadow consistent with cholelithiasis. Largest gallstone measures 1.3 cm in length. The gallbladder wall is borderline thickened. There is no appreciable pericholecystic fluid. Patient is focally tender over the gallbladder. Common bile duct: Diameter: 8 mm, prominent. More distally, the common bile duct is obscured by gas. No intrahepatic biliary duct dilatation. Liver: No focal lesion identified. Within normal limits in parenchymal echogenicity. Portal vein is patent on color Doppler imaging with normal direction of blood flow towards the liver. IVC: No abnormality visualized. Pancreas: No pancreatic mass or inflammatory focus. Spleen: Size and appearance within normal limits. Right Kidney: Length: 9.8 cm. Echogenicity within normal limits. No mass or hydronephrosis visualized. Left Kidney: Length: 10.1 cm. Echogenicity within normal limits. No mass or hydronephrosis visualized. Abdominal aorta: No aneurysm visualized. Other findings: No demonstrable ascites. IMPRESSION: 1. Cholelithiasis with borderline gallbladder wall thickening. Patient is focally tender over the gallbladder. These findings are concerning for potential acute cholecystitis. This finding may warrant correlation with nuclear medicine hepatobiliary imaging study to assess for cystic duct patency. 2. Prominence of the common bile duct. More distally, the common bile duct is obscured by gas. From an imaging standpoint, MRCP would be the imaging study of choice to further evaluate the biliary ductal system. 3.  Study otherwise unremarkable. These results will be called to the ordering clinician or representative by the Radiologist  Assistant, and communication documented in the PACS or zVision Dashboard. Electronically Signed   By: Lowella Grip III M.D.   On: 03/17/2019 11:20    Procedures Procedures (including critical care time)  Medications Ordered in UC Medications  alum & mag hydroxide-simeth (MAALOX/MYLANTA) 200-200-20 MG/5ML suspension 30 mL (20 mLs Oral Given 03/17/19 1113)    Initial Impression / Assessment and Plan / UC Course  I have reviewed the triage vital signs and the nursing notes.  Pertinent labs & imaging results that were available during my care of the patient were reviewed by me and considered in my medical decision making (see chart for details).     MDM  Ultrasound shows multiple gallstones the largest being 1.3 cm.  Patient also has gallbladder wall thickening radiologist advised MRCP would provide more information.  Has slight elevation in white blood cell count to 10.8.  I have ordered chemistries and lipase however these are send out test at this facility it will not return for 12 to 24 hours.  I obtained general surgery consult from Margie Billet PA-C who advised to send the patient to [facility] emergency department I counseled patient on results and need to go to the emergency department for evaluation and she is agreeable. Final Clinical Impressions(s) / UC Diagnoses   Final diagnoses:  Epigastric pain  Calculus of gallbladder with acute cholecystitis without obstruction     Discharge Instructions     Go to [facility] ED for evaltuion   ED Prescriptions    None     PDMP not reviewed this encounter.  An After Visit Summary was printed and given to the patient.    Fransico Meadow, [place] 03/17/19 1210

## 2019-03-17 NOTE — Discharge Instructions (Signed)
Go to [facility] ED for evaltuion

## 2019-03-18 ENCOUNTER — Encounter (HOSPITAL_COMMUNITY): Payer: Self-pay | Admitting: General Surgery

## 2019-03-20 LAB — COMPLETE METABOLIC PANEL WITH GFR
AG Ratio: 2.1 (calc) (ref 1.0–2.5)
ALT: 34 U/L — ABNORMAL HIGH (ref 6–29)
AST: 31 U/L — ABNORMAL HIGH (ref 10–30)
Albumin: 4.6 g/dL (ref 3.6–5.1)
Alkaline phosphatase (APISO): 50 U/L (ref 31–125)
BUN: 10 mg/dL (ref 7–25)
CO2: 26 mmol/L (ref 20–32)
Calcium: 9.5 mg/dL (ref 8.6–10.2)
Chloride: 102 mmol/L (ref 98–110)
Creat: 0.77 mg/dL (ref 0.50–1.10)
GFR, Est African American: 116 mL/min/{1.73_m2} (ref >=60)
GFR, Est Non African American: 100 mL/min/{1.73_m2} (ref >=60)
Globulin: 2.2 g/dL (calc) (ref 1.9–3.7)
Glucose, Bld: 103 mg/dL — ABNORMAL HIGH (ref 65–99)
Potassium: 4 mmol/L (ref 3.5–5.3)
Sodium: 138 mmol/L (ref 135–146)
Total Bilirubin: 0.3 mg/dL (ref 0.2–1.2)
Total Protein: 6.8 g/dL (ref 6.1–8.1)

## 2019-03-20 LAB — LIPASE: Lipase: 8 U/L (ref 7–60)

## 2019-03-20 LAB — CBC WITH DIFFERENTIAL/PLATELET

## 2019-03-21 ENCOUNTER — Ambulatory Visit: Payer: Managed Care, Other (non HMO) | Admitting: Family Medicine

## 2019-03-23 ENCOUNTER — Observation Stay (HOSPITAL_COMMUNITY): Payer: Managed Care, Other (non HMO)

## 2019-03-23 ENCOUNTER — Emergency Department (HOSPITAL_COMMUNITY): Payer: Managed Care, Other (non HMO)

## 2019-03-23 ENCOUNTER — Other Ambulatory Visit: Payer: Self-pay

## 2019-03-23 ENCOUNTER — Encounter (HOSPITAL_COMMUNITY): Payer: Self-pay | Admitting: Emergency Medicine

## 2019-03-23 ENCOUNTER — Inpatient Hospital Stay (HOSPITAL_COMMUNITY)
Admission: EM | Admit: 2019-03-23 | Discharge: 2019-03-27 | DRG: 395 | Disposition: A | Discharge status: Home / Self Care | Payer: Managed Care, Other (non HMO) | Attending: Surgery | Admitting: Surgery

## 2019-03-23 DIAGNOSIS — Z20828 Contact with and (suspected) exposure to other viral communicable diseases: Secondary | ICD-10-CM | POA: Diagnosis present

## 2019-03-23 DIAGNOSIS — K219 Gastro-esophageal reflux disease without esophagitis: Secondary | ICD-10-CM | POA: Diagnosis present

## 2019-03-23 DIAGNOSIS — K838 Other specified diseases of biliary tract: Secondary | ICD-10-CM

## 2019-03-23 DIAGNOSIS — F5101 Primary insomnia: Secondary | ICD-10-CM | POA: Diagnosis present

## 2019-03-23 DIAGNOSIS — F909 Attention-deficit hyperactivity disorder, unspecified type: Secondary | ICD-10-CM | POA: Diagnosis present

## 2019-03-23 DIAGNOSIS — K9189 Other postprocedural complications and disorders of digestive system: Principal | ICD-10-CM | POA: Diagnosis present

## 2019-03-23 DIAGNOSIS — G8929 Other chronic pain: Secondary | ICD-10-CM | POA: Diagnosis present

## 2019-03-23 DIAGNOSIS — Z9049 Acquired absence of other specified parts of digestive tract: Secondary | ICD-10-CM

## 2019-03-23 DIAGNOSIS — R1011 Right upper quadrant pain: Secondary | ICD-10-CM | POA: Diagnosis not present

## 2019-03-23 DIAGNOSIS — R102 Pelvic and perineal pain: Secondary | ICD-10-CM

## 2019-03-23 DIAGNOSIS — Z8249 Family history of ischemic heart disease and other diseases of the circulatory system: Secondary | ICD-10-CM

## 2019-03-23 DIAGNOSIS — Z79899 Other long term (current) drug therapy: Secondary | ICD-10-CM

## 2019-03-23 DIAGNOSIS — F4310 Post-traumatic stress disorder, unspecified: Secondary | ICD-10-CM | POA: Diagnosis present

## 2019-03-23 DIAGNOSIS — Y838 Other surgical procedures as the cause of abnormal reaction of the patient, or of later complication, without mention of misadventure at the time of the procedure: Secondary | ICD-10-CM | POA: Diagnosis present

## 2019-03-23 DIAGNOSIS — Z823 Family history of stroke: Secondary | ICD-10-CM

## 2019-03-23 DIAGNOSIS — Z87891 Personal history of nicotine dependence: Secondary | ICD-10-CM

## 2019-03-23 DIAGNOSIS — K839 Disease of biliary tract, unspecified: Secondary | ICD-10-CM

## 2019-03-23 LAB — COMPREHENSIVE METABOLIC PANEL
ALT: 64 U/L — ABNORMAL HIGH (ref 0–44)
AST: 29 U/L (ref 15–41)
Albumin: 3.6 g/dL (ref 3.5–5.0)
Alkaline Phosphatase: 70 U/L (ref 38–126)
Anion gap: 8 (ref 5–15)
BUN: 8 mg/dL (ref 6–20)
CO2: 29 mmol/L (ref 22–32)
Calcium: 9 mg/dL (ref 8.9–10.3)
Chloride: 98 mmol/L (ref 98–111)
Creatinine, Ser: 0.71 mg/dL (ref 0.44–1.00)
GFR calc Af Amer: >60 mL/min (ref >60)
GFR calc non Af Amer: >60 mL/min (ref >60)
Glucose, Bld: 109 mg/dL — ABNORMAL HIGH (ref 70–99)
Potassium: 4.2 mmol/L (ref 3.5–5.1)
Sodium: 135 mmol/L (ref 135–145)
Total Bilirubin: 0.7 mg/dL (ref 0.3–1.2)
Total Protein: 5.9 g/dL — ABNORMAL LOW (ref 6.5–8.1)

## 2019-03-23 LAB — URINALYSIS, ROUTINE W REFLEX MICROSCOPIC
Bilirubin Urine: NEGATIVE
Glucose, UA: NEGATIVE mg/dL
Hgb urine dipstick: NEGATIVE
Ketones, ur: NEGATIVE mg/dL
Nitrite: NEGATIVE
Protein, ur: NEGATIVE mg/dL
Specific Gravity, Urine: 1.014 (ref 1.005–1.030)
pH: 7 (ref 5.0–8.0)

## 2019-03-23 LAB — I-STAT BETA HCG BLOOD, ED (MC, WL, AP ONLY): I-stat hCG, quantitative: <5 m[IU]/mL (ref <5)

## 2019-03-23 LAB — SARS CORONAVIRUS 2 (TAT 6-24 HRS): SARS Coronavirus 2: NEGATIVE

## 2019-03-23 LAB — CBC WITH DIFFERENTIAL/PLATELET
Abs Immature Granulocytes: 0.02 10*3/uL (ref 0.00–0.07)
Basophils Absolute: 0 10*3/uL (ref 0.0–0.1)
Basophils Relative: 1 %
Eosinophils Absolute: 0.4 10*3/uL (ref 0.0–0.5)
Eosinophils Relative: 4 %
HCT: 36.9 % (ref 36.0–46.0)
Hemoglobin: 11.9 g/dL — ABNORMAL LOW (ref 12.0–15.0)
Immature Granulocytes: 0 %
Lymphocytes Relative: 25 %
Lymphs Abs: 2.1 10*3/uL (ref 0.7–4.0)
MCH: 30.6 pg (ref 26.0–34.0)
MCHC: 32.2 g/dL (ref 30.0–36.0)
MCV: 94.9 fL (ref 80.0–100.0)
Monocytes Absolute: 0.5 10*3/uL (ref 0.1–1.0)
Monocytes Relative: 7 %
Neutro Abs: 5.3 10*3/uL (ref 1.7–7.7)
Neutrophils Relative %: 63 %
Platelets: 249 10*3/uL (ref 150–400)
RBC: 3.89 MIL/uL (ref 3.87–5.11)
RDW: 12.4 % (ref 11.5–15.5)
WBC: 8.4 10*3/uL (ref 4.0–10.5)
nRBC: 0 % (ref 0.0–0.2)

## 2019-03-23 LAB — LIPASE, BLOOD: Lipase: 18 U/L (ref 11–51)

## 2019-03-23 MED ORDER — OXYCODONE HCL 5 MG PO TABS
5.0000 mg | ORAL_TABLET | ORAL | Status: DC | PRN
  Administered 2019-03-23 – 2019-03-27 (×17): 10 mg via ORAL
  Filled 2019-03-23 (×17): qty 2

## 2019-03-23 MED ORDER — SODIUM CHLORIDE 0.9 % IV SOLN
INTRAVENOUS | Status: DC
  Administered 2019-03-23 – 2019-03-26 (×7): via INTRAVENOUS

## 2019-03-23 MED ORDER — DOCUSATE SODIUM 100 MG PO CAPS
100.0000 mg | ORAL_CAPSULE | Freq: Two times a day (BID) | ORAL | Status: DC
  Administered 2019-03-23 – 2019-03-27 (×9): 100 mg via ORAL
  Filled 2019-03-23 (×9): qty 1

## 2019-03-23 MED ORDER — IOHEXOL 300 MG/ML  SOLN
100.0000 mL | Freq: Once | INTRAMUSCULAR | Status: AC | PRN
  Administered 2019-03-23: 10:00:00 100 mL via INTRAVENOUS

## 2019-03-23 MED ORDER — METHOCARBAMOL 500 MG PO TABS
500.0000 mg | ORAL_TABLET | Freq: Four times a day (QID) | ORAL | Status: DC | PRN
  Administered 2019-03-24 – 2019-03-25 (×5): 500 mg via ORAL
  Filled 2019-03-23 (×6): qty 1

## 2019-03-23 MED ORDER — HYDROMORPHONE HCL 1 MG/ML IJ SOLN
0.5000 mg | INTRAMUSCULAR | Status: DC | PRN
  Administered 2019-03-23 – 2019-03-25 (×9): 1 mg via INTRAVENOUS
  Filled 2019-03-23 (×9): qty 1

## 2019-03-23 MED ORDER — ONDANSETRON 4 MG PO TBDP
4.0000 mg | ORAL_TABLET | Freq: Once | ORAL | Status: AC | PRN
  Administered 2019-03-23: 4 mg via ORAL
  Filled 2019-03-23: qty 1

## 2019-03-23 MED ORDER — DIPHENHYDRAMINE HCL 25 MG PO CAPS: 25.0000 mg | ORAL_CAPSULE | Freq: Four times a day (QID) | ORAL | Status: DC | PRN

## 2019-03-23 MED ORDER — ONDANSETRON HCL 4 MG/2ML IJ SOLN
4.0000 mg | Freq: Once | INTRAMUSCULAR | Status: AC
  Administered 2019-03-23: 4 mg via INTRAVENOUS
  Filled 2019-03-23: qty 2

## 2019-03-23 MED ORDER — HYDROMORPHONE HCL 1 MG/ML IJ SOLN
1.0000 mg | INTRAMUSCULAR | Status: AC | PRN
  Administered 2019-03-23 (×2): 1 mg via INTRAVENOUS
  Filled 2019-03-23 (×2): qty 1

## 2019-03-23 MED ORDER — PANTOPRAZOLE SODIUM 40 MG IV SOLR
40.0000 mg | Freq: Every day | INTRAVENOUS | Status: DC
  Administered 2019-03-23 – 2019-03-24 (×2): 40 mg via INTRAVENOUS
  Filled 2019-03-23 (×2): qty 40

## 2019-03-23 MED ORDER — ACETAMINOPHEN 325 MG PO TABS
650.0000 mg | ORAL_TABLET | Freq: Four times a day (QID) | ORAL | Status: DC | PRN
  Administered 2019-03-23 – 2019-03-24 (×5): 650 mg via ORAL
  Filled 2019-03-23 (×5): qty 2

## 2019-03-23 MED ORDER — ENOXAPARIN SODIUM 40 MG/0.4ML ~~LOC~~ SOLN
40.0000 mg | SUBCUTANEOUS | Status: DC
  Administered 2019-03-23 – 2019-03-26 (×4): 40 mg via SUBCUTANEOUS
  Filled 2019-03-23 (×4): qty 0.4

## 2019-03-23 MED ORDER — POLYETHYLENE GLYCOL 3350 17 G PO PACK
17.0000 g | PACK | Freq: Every day | ORAL | Status: DC | PRN
  Administered 2019-03-24: 17 g via ORAL
  Filled 2019-03-23: qty 1

## 2019-03-23 MED ORDER — SIMETHICONE 80 MG PO CHEW
40.0000 mg | CHEWABLE_TABLET | Freq: Four times a day (QID) | ORAL | Status: DC | PRN
  Administered 2019-03-23 – 2019-03-26 (×4): 40 mg via ORAL
  Filled 2019-03-23 (×5): qty 1

## 2019-03-23 MED ORDER — DIPHENHYDRAMINE HCL 50 MG/ML IJ SOLN: 25.0000 mg | Freq: Four times a day (QID) | INTRAMUSCULAR | Status: DC | PRN

## 2019-03-23 MED ORDER — METHOCARBAMOL 500 MG PO TABS: 500.0000 mg | ORAL_TABLET | Freq: Four times a day (QID) | ORAL | Status: DC | PRN

## 2019-03-23 MED ORDER — LORAZEPAM 1 MG PO TABS: 1.0000 mg | ORAL_TABLET | ORAL | Status: DC

## 2019-03-23 MED ORDER — ONDANSETRON 4 MG PO TBDP: 4.0000 mg | ORAL_TABLET | Freq: Four times a day (QID) | ORAL | Status: DC | PRN

## 2019-03-23 MED ORDER — SODIUM CHLORIDE 0.9 % IV BOLUS
1000.0000 mL | Freq: Once | INTRAVENOUS | Status: AC
  Administered 2019-03-23: 1000 mL via INTRAVENOUS

## 2019-03-23 MED ORDER — ALPRAZOLAM 0.5 MG PO TABS
0.5000 mg | ORAL_TABLET | Freq: Three times a day (TID) | ORAL | Status: DC | PRN
  Administered 2019-03-23 – 2019-03-24 (×2): 0.5 mg via ORAL
  Filled 2019-03-23 (×3): qty 1

## 2019-03-23 MED ORDER — FENTANYL CITRATE (PF) 100 MCG/2ML IJ SOLN
50.0000 ug | Freq: Once | INTRAMUSCULAR | Status: AC
  Administered 2019-03-23: 50 ug via INTRAVENOUS
  Filled 2019-03-23: qty 2

## 2019-03-23 MED ORDER — ONDANSETRON HCL 4 MG/2ML IJ SOLN
4.0000 mg | Freq: Four times a day (QID) | INTRAMUSCULAR | Status: DC | PRN
  Administered 2019-03-24 (×2): 4 mg via INTRAVENOUS
  Filled 2019-03-23 (×3): qty 2

## 2019-03-23 MED ORDER — ACETAMINOPHEN 650 MG RE SUPP: 650.0000 mg | Freq: Four times a day (QID) | RECTAL | Status: DC | PRN

## 2019-03-23 MED ORDER — KETOROLAC TROMETHAMINE 15 MG/ML IJ SOLN
15.0000 mg | Freq: Once | INTRAMUSCULAR | Status: AC
  Administered 2019-03-23: 15 mg via INTRAVENOUS
  Filled 2019-03-23: qty 1

## 2019-03-23 MED ORDER — METOPROLOL TARTRATE 5 MG/5ML IV SOLN: 5.0000 mg | Freq: Four times a day (QID) | INTRAVENOUS | Status: DC | PRN

## 2019-03-23 NOTE — ED Notes (Signed)
Dr. Nanavati at triage to assess pt.   

## 2019-03-23 NOTE — ED Provider Notes (Signed)
[name]   CSN: [id number] Arrival date & time: 03/23/19  [id number]     History   Chief Complaint Chief Complaint  Patient presents with  . Abdominal Pain    Post-op    HPI Annette Cook is a 33 y.o. female.     HPI 33 year old female comes in a chief complaint of abdominal pain. She is status post laparoscopic cholecystectomy from 12 3. She reports that she woke up with severe abdominal pain.  The pain is located on the right side and she is having pain in her right scapular region and right upper extremity.  The pain is described as sharp pain, different than her cholecystitis or postop pain.  Patient has had nausea without vomiting.  BM have been normal.  Review of system is negative for any fevers, chills, worsening wound at the incision site or any drainage from the incision site.  Patient was also denies any UTI-like symptoms or history of kidney stones.  Past Medical History:  Diagnosis Date  . ADHD (attention deficit hyperactivity disorder)   . Anxiety   . GERD (gastroesophageal reflux disease)   . Insomnia     Patient Active Problem List   Diagnosis Date Noted  . Right cervical radiculopathy 02/28/2019  . Drug withdrawal seizure with delirium ([place]) 02/28/2019  . Elevated blood pressure reading 02/28/2019  . Gastroesophageal reflux disease 02/28/2019  . Moderate episode of recurrent major depressive disorder ([place]) 02/28/2019  . PTSD (post-traumatic stress disorder) 11/12/2018  . Anxiety 11/12/2018  . Neck pain 11/12/2018  . Acute bilateral low back pain without sciatica 11/12/2018  . Overactive bladder 01/21/2018  . Adult ADHD 04/10/2017  . Opioid use agreement exists 10/24/2016  . Encounter for chronic pain management 10/24/2016  . Takes daily multivitamins 09/12/2016  . 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 Anxiety and depression 09/03/2015  . 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 Acute stress reaction 07/08/2013  . Generalized anxiety disorder 10/22/2012  . Insomnia 10/22/2012  . Panic attack 10/22/2012  . ADHD (attention deficit hyperactivity disorder) 10/22/2012  . HSV infection 12/02/2011    Past Surgical History:  Procedure Laterality Date  . APPENDECTOMY    . CHOLECYSTECTOMY N/A 03/17/2019   Procedure: LAPAROSCOPIC CHOLECYSTECTOMY;  Surgeon: Ralene Ok, MD;  Location: [facility];  Service: General;  Laterality: N/A;  . TONSILLECTOMY AND ADENOIDECTOMY    . WISDOM TOOTH EXTRACTION       OB History    Gravida  3   Para  3   Term  3   Preterm      AB      Living  3     SAB      TAB      Ectopic      Multiple  0   Live Births  3            Home Medications    Prior to Admission medications   Medication Sig Start Date End Date Taking? Authorizing Provider  ALPRAZolam Duanne Moron) 0.5 MG tablet Take 0.5 mg by mouth daily as needed for anxiety. 11/03/18   [provider]  DULoxetine (CYMBALTA) 60 MG capsule Take 1 capsule (60 mg total) by mouth daily. Patient taking differently: Take 30 mg by mouth daily.  02/25/19   Breeback, Jade L, PA-C  famotidine (PEPCID) 20 MG tablet Take 1 tablet (20 mg total) by mouth 2 (two)  times daily. 02/25/19   Breeback, Jade L, PA-C  hydrOXYzine (ATARAX/VISTARIL) 50 MG tablet Take 1 tablet (50 mg total) by mouth every 8 (eight) hours as needed for anxiety. Patient taking differently: Take 50-100 mg by mouth 2 (two) times daily. Take 50 mg in the morning and 100 mg in the evening 01/18/18   Breeback, Jade L, PA-C  Oxcarbazepine (TRILEPTAL) 300 MG tablet Take 300 mg by mouth 2 (two) times daily. 02/11/19   [provider]  tiZANidine (ZANAFLEX) 4 MG tablet Take 4 mg by mouth at bedtime as needed for muscle spasms. 11/26/18   [provider]  traMADol (ULTRAM) 50 MG tablet Take 1 tablet (50 mg total) by mouth every 6 (six) hours as needed.  03/17/19 03/16/20  Ralene Ok, MD  valACYclovir (VALTREX) 500 MG tablet TAKE 1 TABLET BY MOUTH EVERYDAY AT BEDTIME Patient taking differently: Take 500 mg by mouth at bedtime.  02/25/19   Donella Stade, PA-C    Family History Family History  Problem Relation Age of Onset  . Hypertension Mother   . Stroke Mother     Social History Social History   Tobacco Use  . Smoking status: Former Smoker    Packs/day: 1.00    Years: 7.00    Pack years: 7.00    Types: Cigarettes    Quit date: 10/22/2004    Years since quitting: 14.4  . Smokeless tobacco: Never Used  Substance Use Topics  . Alcohol use: Yes    Comment: socially  . Drug use: No     Allergies   Patient has no known allergies.   Review of Systems Review of Systems  Constitutional: Positive for activity change. Negative for fever.  Gastrointestinal: Positive for abdominal pain, nausea and vomiting.  Skin: Positive for wound.  Allergic/Immunologic: Negative for immunocompromised state.  Hematological: Does not bruise/bleed easily.  All other systems reviewed and are negative.    Physical Exam Updated Vital Signs BP 112/82 (BP Location: Right Arm)   Pulse 81   Temp 98.1 F (36.7 C) (Oral)   Resp 14   LMP 03/16/2019 (Exact Date)   SpO2 100%   Physical Exam Vitals signs and nursing note reviewed.  Constitutional:      Appearance: She is well-developed.  HENT:     Head: Normocephalic and atraumatic.  Neck:     Musculoskeletal: Normal range of motion and neck supple.  Cardiovascular:     Rate and Rhythm: Normal rate.  Pulmonary:     Effort: Pulmonary effort is normal.  Abdominal:     General: Bowel sounds are normal.     Tenderness: There is abdominal tenderness in the right upper quadrant, right lower quadrant and epigastric area. There is right CVA tenderness.  Skin:    General: Skin is warm and dry.  Neurological:     Mental Status: She is alert and oriented to person, place, and time.       ED Treatments / Results  Labs (all labs ordered are listed, but only abnormal results are displayed) Labs Reviewed  COMPREHENSIVE METABOLIC PANEL - Abnormal; Notable for the following components:      Result Value   Glucose, Bld 109 (*)    Total Protein 5.9 (*)    ALT 64 (*)    All other components within normal limits  CBC WITH DIFFERENTIAL/PLATELET - Abnormal; Notable for the following components:   Hemoglobin 11.9 (*)    All other components within normal limits  URINALYSIS, ROUTINE W  REFLEX MICROSCOPIC - Abnormal; Notable for the following components:   APPearance HAZY (*)    Leukocytes,Ua TRACE (*)    Bacteria, UA RARE (*)    All other components within normal limits  LIPASE, BLOOD  I-STAT BETA HCG BLOOD, ED ([facility], [facility], [facility] ONLY)    EKG None  Radiology No results found.  Procedures Procedures (including critical care time)  Medications Ordered in ED Medications  HYDROmorphone (DILAUDID) injection 1 mg (1 mg Intravenous Given 03/23/19 0938)  ondansetron (ZOFRAN-ODT) disintegrating tablet 4 mg (4 mg Oral Given 03/23/19 0724)  fentaNYL (SUBLIMAZE) injection 50 mcg (50 mcg Intravenous Given 03/23/19 0845)  ondansetron (ZOFRAN) injection 4 mg (4 mg Intravenous Given 03/23/19 0844)  sodium chloride 0.9 % bolus 1,000 mL (1,000 mLs Intravenous New Bag/Given 03/23/19 0940)     Initial Impression / Assessment and Plan / ED Course  I have reviewed the triage vital signs and the nursing notes.  Pertinent labs & imaging results that were available during my care of the patient were reviewed by me and considered in my medical decision making (see chart for details).        33 year old female 6 days postop from laparoscopic cholecystectomy comes in a chief complaint of abdominal pain.  On exam she has diffuse right-sided tenderness.  Clinical suspicion is high for biliary leak.  Choledocholithiasis is also possible.  Based on history and exam doubt, nephritis, kidney stones or  PE. CT abdomen and pelvis ordered.  Final Clinical Impressions(s) / ED Diagnoses   Final diagnoses:  None    ED Discharge Orders    None       Varney Biles, MD 03/23/19 [id number]

## 2019-03-23 NOTE — H&P (Signed)
[facility] Admission Note  Annette Cook 1985/06/15  [id number].    Requesting MD: Dollene Cleveland Chief Complaint: Right sided abdominal pain radiating to her shoulder and arm, nausea and vomiting Reason for Consult: Postop nausea and vomiting  HPI: Patient is a 33 year old female who was admitted on 03/17/2019 with early acute cholecystitis.  She was seen and underwent laparoscopic cholecystectomy the same day and was discharged from the PACU.  She returned today with the above-noted complaints. States that she was initially doing very well postop. She was sore but pain controlled and tolerating a diet until this morning. She woke up with severe RUQ pain radiating into her right shoulder. Pain is constant, worse with palpation and deep inspiration. No new cough, chest pain, or SOB. Pain is different than prior to surgery. She tried taking percocet but this did not help. She did take 1 xanax and this helped a little. She had a normal BM this morning. Notes that her urine is a little dark but denies dysuria.  Work-up in the ED shows she is afebrile vital signs were stable.  Labs are all essentially normal.  Glucose 109, lipase 18, alk phos 70, AST is 29, ALT is 64.  Total bilirubin 0.7.  WBC 8.4, H/H 11.9/36.9, platelets 249,000.  hCG is negative, UA is negative.  CT scan was obtained and showed patient status post cholecystectomy with minimal amount of fat stranding changes seen within the gallbladder bed.  No loculated fluid collections were seen.  There is a mildly dilated common bile duct measuring up to 10 mm.  No definite calcified stones are seen in the common bile duct.  The main portal vein was patent.  CBD measured 8 mm on her abdominal ultrasound on 03/17/2019.  Ultrasound was then repeated, and common bile duct measured 6.1 mm on ultrasound.  Again no evidence of common bile duct stones.  MRI shows mildly dilated common hepatic duct and common bile duct, no filling defect within the  bile duct and no intrahepatic duct dilatation present. Pelvis u/s to rule out torsion is pending.  ROS: Review of Systems  Constitutional: Negative.   HENT: Negative.   Eyes: Negative.   Respiratory: Negative.   Cardiovascular: Negative.   Gastrointestinal: Positive for abdominal pain, nausea and vomiting. Negative for constipation and diarrhea.  Genitourinary: Positive for hematuria. Negative for dysuria.  Musculoskeletal: Positive for joint pain.  Skin: Negative.   Neurological: Negative.     Family History  Problem Relation Age of Onset  . Hypertension Mother   . Stroke Mother     Past Medical History:  Diagnosis Date  . ADHD (attention deficit hyperactivity disorder)   . Anxiety   . GERD (gastroesophageal reflux disease)   . Insomnia     Past Surgical History:  Procedure Laterality Date  . APPENDECTOMY    . CHOLECYSTECTOMY N/A 03/17/2019   Procedure: LAPAROSCOPIC CHOLECYSTECTOMY;  Surgeon: Ralene Ok, MD;  Location: [facility];  Service: General;  Laterality: N/A;  . TONSILLECTOMY AND ADENOIDECTOMY    . WISDOM TOOTH EXTRACTION      Social History:  reports that she quit smoking about 14 years ago. Her smoking use included cigarettes. She has a 7.00 pack-year smoking history. She has never used smokeless tobacco. She reports current alcohol use. She reports that she does not use drugs.  Allergies: No Known Allergies  Prior to Admission medications   Medication Sig Start Date End Date Taking? Authorizing Provider  ALPRAZolam Duanne Moron) 0.5 MG tablet Take  0.5 mg by mouth daily as needed for anxiety. 11/03/18   [provider]  DULoxetine (CYMBALTA) 60 MG capsule Take 1 capsule (60 mg total) by mouth daily. Patient taking differently: Take 30 mg by mouth daily.  02/25/19   Breeback, Jade L, PA-C  famotidine (PEPCID) 20 MG tablet Take 1 tablet (20 mg total) by mouth 2 (two) times daily. 02/25/19   Breeback, Jade L, PA-C  hydrOXYzine (ATARAX/VISTARIL) 50 MG tablet  Take 1 tablet (50 mg total) by mouth every 8 (eight) hours as needed for anxiety. Patient taking differently: Take 50-100 mg by mouth 2 (two) times daily. Take 50 mg in the morning and 100 mg in the evening 01/18/18   Breeback, Jade L, PA-C  Oxcarbazepine (TRILEPTAL) 300 MG tablet Take 300 mg by mouth 2 (two) times daily. 02/11/19   [provider]  tiZANidine (ZANAFLEX) 4 MG tablet Take 4 mg by mouth at bedtime as needed for muscle spasms. 11/26/18   [provider]  traMADol (ULTRAM) 50 MG tablet Take 1 tablet (50 mg total) by mouth every 6 (six) hours as needed. 03/17/19 03/16/20  Ralene Ok, MD  valACYclovir (VALTREX) 500 MG tablet TAKE 1 TABLET BY MOUTH EVERYDAY AT BEDTIME Patient taking differently: Take 500 mg by mouth at bedtime.  02/25/19   Breeback, Jade L, PA-C     Blood pressure (!) 102/55, pulse 79, temperature 98.1 F (36.7 C), temperature source Oral, resp. rate 14, last menstrual period 03/16/2019, SpO2 100 %, currently breastfeeding. Physical Exam: General: pleasant, WD/WN white female who is sitting up in bed, hunched over, NAD  HEENT: head is normocephalic, atraumatic.  Sclera are noninjected.  Pupils equal and round.  Ears and nose without any masses or lesions.  Mouth is pink and moist. Dentition fair Heart: regular, rate, and rhythm.  No obvious murmurs, gallops, or rubs noted.  Palpable pedal pulses bilaterally Lungs: CTAB, no wheezes, rhonchi, or rales noted.  Respiratory effort nonlabored Abd: soft, ND, +BS, no masses, hernias, or organomegaly. Lab incisions with trace ecchymosis, no erythema or drainage. Moderate TTP epigastric region and RUQ with voluntary guarding MS: calves soft and nontender Skin: warm and dry with no masses, lesions, or rashes Psych: A&Ox3 with an appropriate affect. Neuro: cranial nerves grossly intact, extremity CSM intact bilaterally, normal speech  Results for orders placed or performed during the hospital encounter of  03/23/19 (from the past 48 hour(s))  Comprehensive metabolic panel     Status: Abnormal   Collection Time: 03/23/19  7:30 AM  Result Value Ref Range   Sodium 135 135 - 145 mmol/L   Potassium 4.2 3.5 - 5.1 mmol/L   Chloride 98 98 - 111 mmol/L   CO2 29 22 - 32 mmol/L   Glucose, Bld 109 (H) 70 - 99 mg/dL   BUN 8 6 - 20 mg/dL   Creatinine, Ser 0.71 0.44 - 1.00 mg/dL   Calcium 9.0 8.9 - 10.3 mg/dL   Total Protein 5.9 (L) 6.5 - 8.1 g/dL   Albumin 3.6 3.5 - 5.0 g/dL   AST 29 15 - 41 U/L   ALT 64 (H) 0 - 44 U/L   Alkaline Phosphatase 70 38 - 126 U/L   Total Bilirubin 0.7 0.3 - 1.2 mg/dL   GFR calc non Af Amer >60 >60 mL/min   GFR calc Af Amer >60 >60 mL/min   Anion gap 8 5 - 15    Comment: Performed at [facility], [address],  [place] [address]  Lipase, blood     Status: None   Collection Time: 03/23/19  7:30 AM  Result Value Ref Range   Lipase 18 11 - 51 U/L    Comment: Performed at [facility], [place] [address] [place], [place] [address]  CBC with Diff     Status: Abnormal   Collection Time: 03/23/19  7:30 AM  Result Value Ref Range   WBC 8.4 4.0 - 10.5 K/uL   RBC 3.89 3.87 - 5.11 MIL/uL   Hemoglobin 11.9 (L) 12.0 - 15.0 g/dL   HCT 36.9 36.0 - 46.0 %   MCV 94.9 80.0 - 100.0 fL   MCH 30.6 26.0 - 34.0 pg   MCHC 32.2 30.0 - 36.0 g/dL   RDW 12.4 11.5 - 15.5 %   Platelets 249 150 - 400 K/uL   nRBC 0.0 0.0 - 0.2 %   Neutrophils Relative % 63 %   Neutro Abs 5.3 1.7 - 7.7 K/uL   Lymphocytes Relative 25 %   Lymphs Abs 2.1 0.7 - 4.0 K/uL   Monocytes Relative 7 %   Monocytes Absolute 0.5 0.1 - 1.0 K/uL   Eosinophils Relative 4 %   Eosinophils Absolute 0.4 0.0 - 0.5 K/uL   Basophils Relative 1 %   Basophils Absolute 0.0 0.0 - 0.1 K/uL   Immature Granulocytes 0 %   Abs Immature Granulocytes 0.02 0.00 - 0.07 K/uL    Comment: Performed at [facility] [address] [place], [place] [address]  Urinalysis, Routine w reflex microscopic     Status:  Abnormal   Collection Time: 03/23/19  8:27 AM  Result Value Ref Range   Color, Urine YELLOW YELLOW   APPearance HAZY (A) CLEAR   Specific Gravity, Urine 1.014 1.005 - 1.030   pH 7.0 5.0 - 8.0   Glucose, UA NEGATIVE NEGATIVE mg/dL   Hgb urine dipstick NEGATIVE NEGATIVE   Bilirubin Urine NEGATIVE NEGATIVE   Ketones, ur NEGATIVE NEGATIVE mg/dL   Protein, ur NEGATIVE NEGATIVE mg/dL   Nitrite NEGATIVE NEGATIVE   Leukocytes,Ua TRACE (A) NEGATIVE   RBC / HPF 0-5 0 - 5 RBC/hpf   WBC, UA 0-5 0 - 5 WBC/hpf   Bacteria, UA RARE (A) NONE SEEN   Squamous Epithelial / LPF 0-5 0 - 5   Amorphous Crystal PRESENT     Comment: Performed at [facility], [address] [place], [place] [address]  I-Stat beta hCG blood, ED     Status: None   Collection Time: 03/23/19  8:51 AM  Result Value Ref Range   I-stat hCG, quantitative <5.0 <5 mIU/mL   Comment 3            Comment:   GEST. AGE      CONC.  (mIU/mL)   <=1 WEEK        5 - 50     2 WEEKS       50 - 500     3 WEEKS       100 - 10,000     4 WEEKS     1,000 - 30,000        FEMALE AND NON-PREGNANT FEMALE:     LESS THAN 5 mIU/mL    Ct Abdomen Pelvis W Contrast  Result Date: 03/23/2019 CLINICAL DATA:  Nausea vomiting, abdominal pain EXAM: CT ABDOMEN AND PELVIS WITH CONTRAST TECHNIQUE: Multidetector CT imaging of the abdomen and pelvis was performed using the standard protocol following  bolus administration of intravenous contrast. CONTRAST:  146m OMNIPAQUE IOHEXOL 300 MG/ML  SOLN COMPARISON:  Ultrasound March 17, 2019 FINDINGS: Lower chest: The visualized heart size within normal limits. No pericardial fluid/thickening. No hiatal hernia. The visualized portions of the lungs are clear. Hepatobiliary: The patient is status post cholecystectomy. Minimal amount of fat stranding changes seen within the gallbladder bed. No loculated fluid collections are seen. There is a mildly dilated common bile duct measuring up to 10 mm. No definite calcified  stones are seen within the common bile duct.The main portal vein is patent. Pancreas: Unremarkable. No pancreatic ductal dilatation or surrounding inflammatory changes. Spleen: Normal in size without focal abnormality. Adrenals/Urinary Tract: Both adrenal glands appear normal. The kidneys and collecting system appear normal without evidence of urinary tract calculus or hydronephrosis. Bladder is unremarkable. Stomach/Bowel: The stomach, small bowel, and colon are normal in appearance. No inflammatory changes, wall thickening, or obstructive findings.The appendix is normal. Vascular/Lymphatic: There are no enlarged mesenteric, retroperitoneal, or pelvic lymph nodes. No significant vascular findings are present. Reproductive: The uterus and adnexa are unremarkable. Other:there is a small amount of free fluid seen within the deep pelvis. There is air seen within the right anterior abdominal wall musculature as well as subcutaneous emphysema. There is also fat stranding changes seen at the umbilicus likely postsurgical changes. Musculoskeletal: No acute or significant osseous findings. IMPRESSION: 1. Status post cholecystectomy with amount of fat stranding changes in the gallbladder bed. No definite loculated fluid collections however are noted. 2. Mildly dilated common bile duct without definite evidence choledocholithiasis. However if further evaluation is required would recommend MRCP. 3. Subcutaneous emphysema over the right anterior abdominal with fat stranding at the umbilicus, likely postsurgical changes. Electronically Signed   By: BPrudencio PairM.D.   On: 03/23/2019 10:38   UKoreaAbdomen Limited Ruq  Result Date: 03/23/2019 CLINICAL DATA:  Abdominal pain with nausea and vomiting. Cholecystectomy on 03/17/2019. EXAM: ULTRASOUND ABDOMEN LIMITED RIGHT UPPER QUADRANT COMPARISON:  CT scan of the abdomen dated 03/23/2019 FINDINGS: Gallbladder: Removed Common bile duct: Diameter: 6.1 mm. No evidence of common bile  duct stone. No dilated intrahepatic bile ducts. Liver: No focal lesion identified. Within normal limits in parenchymal echogenicity. Portal vein is patent on color Doppler imaging with normal direction of blood flow towards the liver. Other: There is a tiny amount of free fluid adjacent to the posterior aspect of the right lobe of the liver, likely postsurgical. IMPRESSION: No evidence of common bile duct stone. Common bile duct on this ultrasound exam measures only 6.1 mm in diameter with no dilated intrahepatic bile ducts. This is within normal limits for a post cholecystectomy patient. Electronically Signed   By: JLorriane ShireM.D.   On: 03/23/2019 11:54      Assessment/Plan ADHD Hx generalized anxiety disorder/Hx panic attack Hx PTSD Hx AMS 12/2018 questionable drug use Hx primary insomnia  S/p laparoscopic cholecystectomy 03/17/19 Dr. RRosendo GrosPostop abdominal pain/nausea and vomiting - Workup including labs, CT, u/s, and MRCP essentially normal. Unsure as to the cause of her pain but will plan to admit for observation and pain control. Repeat labs in the AM. Pelvic u/s pending to rule out torsion.  ID - none FEN - IVF, FLD Foley - none Follow up - DOW/TBD   BWellington Hampshire PBirchwood VillageSurgery 03/23/2019, 4:11 PM Please see Amion for pager number during day hours 7:00am-4:30pm

## 2019-03-23 NOTE — ED Notes (Signed)
ED TO INPATIENT HANDOFF REPORT  ED Nurse Name and Phone #: Thurmond Butts Crabtree Name/Age/Gender Annette Cook 33 y.o. female Room/Bed: H019C/H019C  Code Status   Code Status: Full Code  Home/SNF/Other Home Patient oriented to: AOx4 Is this baseline? Yes   Triage Complete: Triage complete  Chief Complaint post op complications  Triage Note Pt had cholecystectomy on 12/3.  Woke up with severe R sided abd pain radiating to R shoulder and R arm.with nausea.  Took 1 Percocet and got in bathtub.  States pain has not improved with pain medication.     Allergies No Known Allergies  Level of Care/Admitting Diagnosis ED Disposition    ED Disposition Condition Comment   Admit  Hospital Area: [facility] [[id number]]  Level of Care: Med-Surg [16]  Covid Evaluation: N/A  Diagnosis: Status post laparoscopic cholecystectomy [id number]  Admitting Physician: CCS, [place]  Attending Physician: CCS, MD [[id number]  Bed request comments: 6N  PT Class (Do Not Modify): Observation [104]  PT Acc Code (Do Not Modify): Observation [10022]       B Medical/Surgery History Past Medical History:  Diagnosis Date  . ADHD (attention deficit hyperactivity disorder)   . Anxiety   . GERD (gastroesophageal reflux disease)   . Insomnia    Past Surgical History:  Procedure Laterality Date  . APPENDECTOMY    . CHOLECYSTECTOMY N/A 03/17/2019   Procedure: LAPAROSCOPIC CHOLECYSTECTOMY;  Surgeon: Ralene Ok, MD;  Location: [facility];  Service: General;  Laterality: N/A;  . TONSILLECTOMY AND ADENOIDECTOMY    . WISDOM TOOTH EXTRACTION       A IV Location/Drains/Wounds Patient Lines/Drains/Airways Status   Active Line/Drains/Airways    Name:   Placement date:   Placement time:   Site:   Days:   Peripheral IV 03/23/19 Left Antecubital   03/23/19    0839    Antecubital   less than 1   Incision (Closed) 03/17/19 Abdomen Other (Comment)   03/17/19    1714     6   Incision - 3 Ports Abdomen  1: Umbilicus 2: Mid;Upper 3: Right;Lateral   03/17/19    1741     6          Intake/Output Last 24 hours  Intake/Output Summary (Last 24 hours) at 03/23/2019 1531 Last data filed at 03/23/2019 1347 Gross per 24 hour  Intake 1000 ml  Output -  Net 1000 ml    Labs/Imaging Results for orders placed or performed during the hospital encounter of 03/23/19 (from the past 48 hour(s))  Comprehensive metabolic panel     Status: Abnormal   Collection Time: 03/23/19  7:30 AM  Result Value Ref Range   Sodium 135 135 - 145 mmol/L   Potassium 4.2 3.5 - 5.1 mmol/L   Chloride 98 98 - 111 mmol/L   CO2 29 22 - 32 mmol/L   Glucose, Bld 109 (H) 70 - 99 mg/dL   BUN 8 6 - 20 mg/dL   Creatinine, Ser 0.71 0.44 - 1.00 mg/dL   Calcium 9.0 8.9 - 10.3 mg/dL   Total Protein 5.9 (L) 6.5 - 8.1 g/dL   Albumin 3.6 3.5 - 5.0 g/dL   AST 29 15 - 41 U/L   ALT 64 (H) 0 - 44 U/L   Alkaline Phosphatase 70 38 - 126 U/L   Total Bilirubin 0.7 0.3 - 1.2 mg/dL   GFR calc non Af Amer >60 >60 mL/min   GFR calc Af Amer >60 >60  mL/min   Anion gap 8 5 - 15    Comment: Performed at [facility] [address] [place], [place] [address]  Lipase, blood     Status: None   Collection Time: 03/23/19  7:30 AM  Result Value Ref Range   Lipase 18 11 - 51 U/L    Comment: Performed at [facility] [address], [place], [place] [address]  CBC with Diff     Status: Abnormal   Collection Time: 03/23/19  7:30 AM  Result Value Ref Range   WBC 8.4 4.0 - 10.5 K/uL   RBC 3.89 3.87 - 5.11 MIL/uL   Hemoglobin 11.9 (L) 12.0 - 15.0 g/dL   HCT 36.9 36.0 - 46.0 %   MCV 94.9 80.0 - 100.0 fL   MCH 30.6 26.0 - 34.0 pg   MCHC 32.2 30.0 - 36.0 g/dL   RDW 12.4 11.5 - 15.5 %   Platelets 249 150 - 400 K/uL   nRBC 0.0 0.0 - 0.2 %   Neutrophils Relative % 63 %   Neutro Abs 5.3 1.7 - 7.7 K/uL   Lymphocytes Relative 25 %   Lymphs Abs 2.1 0.7 - 4.0 K/uL   Monocytes Relative 7 %   Monocytes Absolute 0.5 0.1 - 1.0 K/uL    Eosinophils Relative 4 %   Eosinophils Absolute 0.4 0.0 - 0.5 K/uL   Basophils Relative 1 %   Basophils Absolute 0.0 0.0 - 0.1 K/uL   Immature Granulocytes 0 %   Abs Immature Granulocytes 0.02 0.00 - 0.07 K/uL    Comment: Performed at [facility] [address], [place], [place] [address]  Urinalysis, Routine w reflex microscopic     Status: Abnormal   Collection Time: 03/23/19  8:27 AM  Result Value Ref Range   Color, Urine YELLOW YELLOW   APPearance HAZY (A) CLEAR   Specific Gravity, Urine 1.014 1.005 - 1.030   pH 7.0 5.0 - 8.0   Glucose, UA NEGATIVE NEGATIVE mg/dL   Hgb urine dipstick NEGATIVE NEGATIVE   Bilirubin Urine NEGATIVE NEGATIVE   Ketones, ur NEGATIVE NEGATIVE mg/dL   Protein, ur NEGATIVE NEGATIVE mg/dL   Nitrite NEGATIVE NEGATIVE   Leukocytes,Ua TRACE (A) NEGATIVE   RBC / HPF 0-5 0 - 5 RBC/hpf   WBC, UA 0-5 0 - 5 WBC/hpf   Bacteria, UA RARE (A) NONE SEEN   Squamous Epithelial / LPF 0-5 0 - 5   Amorphous Crystal PRESENT     Comment: Performed at [facility], [address] [place], [place] [address]  I-Stat beta hCG blood, ED     Status: None   Collection Time: 03/23/19  8:51 AM  Result Value Ref Range   I-stat hCG, quantitative <5.0 <5 mIU/mL   Comment 3            Comment:   GEST. AGE      CONC.  (mIU/mL)   <=1 WEEK        5 - 50     2 WEEKS       50 - 500     3 WEEKS       100 - 10,000     4 WEEKS     1,000 - 30,000        FEMALE AND NON-PREGNANT FEMALE:     LESS THAN 5 mIU/mL    Ct Abdomen Pelvis W Contrast  Result Date: 03/23/2019 CLINICAL DATA:  Nausea vomiting, abdominal  pain EXAM: CT ABDOMEN AND PELVIS WITH CONTRAST TECHNIQUE: Multidetector CT imaging of the abdomen and pelvis was performed using the standard protocol following bolus administration of intravenous contrast. CONTRAST:  153mL OMNIPAQUE IOHEXOL 300 MG/ML  SOLN COMPARISON:  Ultrasound March 17, 2019 FINDINGS: Lower chest: The visualized heart size within normal limits. No  pericardial fluid/thickening. No hiatal hernia. The visualized portions of the lungs are clear. Hepatobiliary: The patient is status post cholecystectomy. Minimal amount of fat stranding changes seen within the gallbladder bed. No loculated fluid collections are seen. There is a mildly dilated common bile duct measuring up to 10 mm. No definite calcified stones are seen within the common bile duct.The main portal vein is patent. Pancreas: Unremarkable. No pancreatic ductal dilatation or surrounding inflammatory changes. Spleen: Normal in size without focal abnormality. Adrenals/Urinary Tract: Both adrenal glands appear normal. The kidneys and collecting system appear normal without evidence of urinary tract calculus or hydronephrosis. Bladder is unremarkable. Stomach/Bowel: The stomach, small bowel, and colon are normal in appearance. No inflammatory changes, wall thickening, or obstructive findings.The appendix is normal. Vascular/Lymphatic: There are no enlarged mesenteric, retroperitoneal, or pelvic lymph nodes. No significant vascular findings are present. Reproductive: The uterus and adnexa are unremarkable. Other:there is a small amount of free fluid seen within the deep pelvis. There is air seen within the right anterior abdominal wall musculature as well as subcutaneous emphysema. There is also fat stranding changes seen at the umbilicus likely postsurgical changes. Musculoskeletal: No acute or significant osseous findings. IMPRESSION: 1. Status post cholecystectomy with amount of fat stranding changes in the gallbladder bed. No definite loculated fluid collections however are noted. 2. Mildly dilated common bile duct without definite evidence choledocholithiasis. However if further evaluation is required would recommend MRCP. 3. Subcutaneous emphysema over the right anterior abdominal with fat stranding at the umbilicus, likely postsurgical changes. Electronically Signed   By: Prudencio Pair M.D.   On:  03/23/2019 10:38   Mr Abdomen Mrcp Wo Contrast  Result Date: 03/23/2019 CLINICAL DATA:  Dilated common bile duct. Prior cholecystectomy. Cholecystectomy several days prior (03/17/2019 EXAM: MRI ABDOMEN WITHOUT CONTRAST  (INCLUDING MRCP) TECHNIQUE: Multiplanar multisequence MR imaging of the abdomen was performed. Heavily T2-weighted images of the biliary and pancreatic ducts were obtained, and three-dimensional MRCP images were rendered by post processing. COMPARISON:  CT 01/21/2019, ultrasound 03/17/2019 FINDINGS: Patient refused to complete entirety of exam due to complaints of pain. Pain medications were administered. Patient still could not completely exam. The MRCP sequences are degraded by patient motion. Lower chest:  Lung bases are clear. Hepatobiliary: No intrahepatic biliary duct dilatation. The common hepatic duct measures 8 mm. The common bile duct measures 8 mm. The common bile duct tapers smoothly to the ampulla. No filling defect is seen within the common bile duct. There is small amount of fluid within the gallbladder fossa. Fluid does not appear organized (image 10/7). A small amount fluid along the posterior margin RIGHT hepatic lobe additionally (image 28/5). This volume of fluid is felt to be within normal limits following cholecystectomy. There is a small hyperintense lesion LEFT lateral hepatic lobe measuring 1 cm (114/3) most consistent benign cysts or hemangioma. Pancreas: Normal pancreatic parenchymal intensity. No ductal dilatation or inflammation. Spleen: Normal spleen. Adrenals/urinary tract: Adrenal glands and kidneys are normal. Stomach/Bowel: Stomach and limited of the small bowel is unremarkable Vascular/Lymphatic: Abdominal aortic normal caliber. No retroperitoneal periportal lymphadenopathy. Musculoskeletal: No aggressive osseous lesion. Subcutaneous gas in the RIGHT abdominal wall likely related to laparoscopic surgery.  Gas better appreciated on comparison CT. IMPRESSION: 1.  Incomplete exam. Patient reported pain which which prohibited complete exam. Pain medications were administered; however, the patient still could not complete exam. 2. With this caveat, the common hepatic duct and common bile duct are mildly dilated. No filling defect within the bile duct identified. No intrahepatic duct dilatation present. 3. There is small amount of fluid within the gallbladder fossa and along the posterior margin of the RIGHT hepatic lobe which is felt to be within normal limits following cholecystectomy. No organized collections. 4. Normal pancreas without evidence of pancreatitis. 5. Subcutaneous gas in the RIGHT abdominal wall related to laparoscopic. Better appreciated on CT. Electronically Signed   By: Suzy Bouchard M.D.   On: 03/23/2019 13:57   US Abdomen Limited Ruq  Result Date: 03/23/2019 CLINICAL DATA:  Abdominal pain with nausea and vomiting. Cholecystectomy on 03/17/2019. EXAM: ULTRASOUND ABDOMEN LIMITED RIGHT UPPER QUADRANT COMPARISON:  CT scan of the abdomen dated 03/23/2019 FINDINGS: Gallbladder: Removed Common bile duct: Diameter: 6.1 mm. No evidence of common bile duct stone. No dilated intrahepatic bile ducts. Liver: No focal lesion identified. Within normal limits in parenchymal echogenicity. Portal vein is patent on color Doppler imaging with normal direction of blood flow towards the liver. Other: There is a tiny amount of free fluid adjacent to the posterior aspect of the right lobe of the liver, likely postsurgical. IMPRESSION: No evidence of common bile duct stone. Common bile duct on this ultrasound exam measures only 6.1 mm in diameter with no dilated intrahepatic bile ducts. This is within normal limits for a post cholecystectomy patient. Electronically Signed   By: Lorriane Shire M.D.   On: 03/23/2019 11:54    Pending Labs Unresulted Labs (From admission, onward)    Start     Ordered   03/30/19 0500  Creatinine, serum  (enoxaparin (LOVENOX)    CrCl >/= 30  ml/min)  Weekly,   R    Comments: while on enoxaparin therapy    03/23/19 1500   03/24/19 0500  Comprehensive metabolic panel  Tomorrow morning,   R     03/23/19 1500   03/24/19 0500  CBC  Tomorrow morning,   R     03/23/19 1500   03/23/19 1456  HIV Antibody (routine testing w rflx)  (HIV Antibody (Routine testing w reflex) panel)  Once,   STAT     03/23/19 1500          Vitals/Pain Today's Vitals   03/23/19 0721 03/23/19 1021 03/23/19 1058 03/23/19 1527  BP:   (!) 102/55 (!) 101/56  Pulse:   79 99  Resp:   14 16  Temp:    97.7 F (36.5 C)  TempSrc:    Oral  SpO2:   100% 99%  PainSc: 10-Worst pain ever 10-Worst pain ever      Isolation Precautions No active isolations  Medications Medications  enoxaparin (LOVENOX) injection 40 mg (has no administration in time range)  0.9 %  sodium chloride infusion (has no administration in time range)  metoprolol tartrate (LOPRESSOR) injection 5 mg (has no administration in time range)  pantoprazole (PROTONIX) injection 40 mg (has no administration in time range)  simethicone (MYLICON) chewable tablet 40 mg (has no administration in time range)  ondansetron (ZOFRAN-ODT) disintegrating tablet 4 mg (has no administration in time range)    Or  ondansetron (ZOFRAN) injection 4 mg (has no administration in time range)  polyethylene glycol (MIRALAX / GLYCOLAX) packet 17 g (has no  administration in time range)  docusate sodium (COLACE) capsule 100 mg (has no administration in time range)  diphenhydrAMINE (BENADRYL) capsule 25 mg (has no administration in time range)    Or  diphenhydrAMINE (BENADRYL) injection 25 mg (has no administration in time range)  methocarbamol (ROBAXIN) tablet 500 mg (has no administration in time range)  HYDROmorphone (DILAUDID) injection 0.5-1 mg (has no administration in time range)  oxyCODONE (Oxy IR/ROXICODONE) immediate release tablet 5-10 mg (has no administration in time range)  acetaminophen (TYLENOL)  tablet 650 mg (has no administration in time range)    Or  acetaminophen (TYLENOL) suppository 650 mg (has no administration in time range)  ketorolac (TORADOL) 15 MG/ML injection 15 mg (has no administration in time range)  ALPRAZolam (XANAX) tablet 0.5 mg (has no administration in time range)  ondansetron (ZOFRAN-ODT) disintegrating tablet 4 mg (4 mg Oral Given 03/23/19 0724)  fentaNYL (SUBLIMAZE) injection 50 mcg (50 mcg Intravenous Given 03/23/19 0845)  ondansetron (ZOFRAN) injection 4 mg (4 mg Intravenous Given 03/23/19 0844)  HYDROmorphone (DILAUDID) injection 1 mg (1 mg Intravenous Given 03/23/19 1225)  sodium chloride 0.9 % bolus 1,000 mL (0 mLs Intravenous Stopped 03/23/19 1347)  iohexol (OMNIPAQUE) 300 MG/ML solution 100 mL (100 mLs Intravenous Contrast Given 03/23/19 1012)    Mobility walks Low fall risk   Focused Assessments    R Recommendations: See Admitting Provider Note  Report given to:   Additional Notes:

## 2019-03-23 NOTE — ED Triage Notes (Signed)
Pt had cholecystectomy on 12/3.  Woke up with severe R sided abd pain radiating to R shoulder and R arm.with nausea.  Took 1 Percocet and got in bathtub.  States pain has not improved with pain medication.

## 2019-03-23 NOTE — ED Notes (Signed)
Patient transported to MRI 

## 2019-03-23 NOTE — Progress Notes (Signed)
This nurse was called to patient bedside at request of patient asking to speak with charge nurse. Patient relates she has been here all day and we are not taking care of her. She states she is in severe pain 10/10 and nothing we are giving her is working. She requests for the Dr's presence at bedside. This nurse reached out to Dr. Erroll Luna who is unable to come to the floor currently but gave verbal orders for Ativan. While at patient bedside, this nurse discovered percocet in patients belongings and vape machine in the bathroom. This nurse, patients primary nurse Marcie Bal and patient counted Percocet for a total of 9 tablets. This nurse obtained medication and is going to take them to pharmacy per protocal. Vape also obtained and will be placed with security and returned to patient upon discharge. Before exiting patients room, patient requests stool softener. This nurse will medicate patient appropriately

## 2019-03-24 ENCOUNTER — Inpatient Hospital Stay (HOSPITAL_COMMUNITY): Payer: Managed Care, Other (non HMO)

## 2019-03-24 ENCOUNTER — Inpatient Hospital Stay (HOSPITAL_COMMUNITY): Payer: Managed Care, Other (non HMO) | Admitting: Anesthesiology

## 2019-03-24 ENCOUNTER — Encounter (HOSPITAL_COMMUNITY): Admission: EM | Disposition: A | Discharge status: Home / Self Care | Payer: Self-pay | Source: Home / Self Care

## 2019-03-24 ENCOUNTER — Encounter (HOSPITAL_COMMUNITY): Payer: Self-pay

## 2019-03-24 DIAGNOSIS — Z87891 Personal history of nicotine dependence: Secondary | ICD-10-CM | POA: Diagnosis not present

## 2019-03-24 DIAGNOSIS — K219 Gastro-esophageal reflux disease without esophagitis: Secondary | ICD-10-CM | POA: Diagnosis present

## 2019-03-24 DIAGNOSIS — Z20828 Contact with and (suspected) exposure to other viral communicable diseases: Secondary | ICD-10-CM | POA: Diagnosis present

## 2019-03-24 DIAGNOSIS — G8929 Other chronic pain: Secondary | ICD-10-CM | POA: Diagnosis present

## 2019-03-24 DIAGNOSIS — Z8249 Family history of ischemic heart disease and other diseases of the circulatory system: Secondary | ICD-10-CM | POA: Diagnosis not present

## 2019-03-24 DIAGNOSIS — F5101 Primary insomnia: Secondary | ICD-10-CM | POA: Diagnosis present

## 2019-03-24 DIAGNOSIS — Z79899 Other long term (current) drug therapy: Secondary | ICD-10-CM | POA: Diagnosis not present

## 2019-03-24 DIAGNOSIS — Z823 Family history of stroke: Secondary | ICD-10-CM | POA: Diagnosis not present

## 2019-03-24 DIAGNOSIS — F4310 Post-traumatic stress disorder, unspecified: Secondary | ICD-10-CM | POA: Diagnosis present

## 2019-03-24 DIAGNOSIS — R1011 Right upper quadrant pain: Secondary | ICD-10-CM | POA: Diagnosis present

## 2019-03-24 DIAGNOSIS — Y838 Other surgical procedures as the cause of abnormal reaction of the patient, or of later complication, without mention of misadventure at the time of the procedure: Secondary | ICD-10-CM | POA: Diagnosis present

## 2019-03-24 DIAGNOSIS — Z9049 Acquired absence of other specified parts of digestive tract: Secondary | ICD-10-CM | POA: Diagnosis not present

## 2019-03-24 DIAGNOSIS — F909 Attention-deficit hyperactivity disorder, unspecified type: Secondary | ICD-10-CM | POA: Diagnosis present

## 2019-03-24 DIAGNOSIS — K9189 Other postprocedural complications and disorders of digestive system: Secondary | ICD-10-CM | POA: Diagnosis present

## 2019-03-24 HISTORY — PX: BILIARY STENT PLACEMENT: SHX5538

## 2019-03-24 HISTORY — PX: ERCP: SHX5425

## 2019-03-24 HISTORY — PX: SPHINCTEROTOMY: SHX5544

## 2019-03-24 LAB — RAPID URINE DRUG SCREEN, HOSP PERFORMED
Amphetamines: NOT DETECTED
Barbiturates: NOT DETECTED
Benzodiazepines: POSITIVE — AB
Cocaine: NOT DETECTED
Opiates: POSITIVE — AB
Tetrahydrocannabinol: NOT DETECTED

## 2019-03-24 LAB — CBC
HCT: 35.5 % — ABNORMAL LOW (ref 36.0–46.0)
Hemoglobin: 11.7 g/dL — ABNORMAL LOW (ref 12.0–15.0)
MCH: 30.5 pg (ref 26.0–34.0)
MCHC: 33 g/dL (ref 30.0–36.0)
MCV: 92.4 fL (ref 80.0–100.0)
Platelets: 234 10*3/uL (ref 150–400)
RBC: 3.84 MIL/uL — ABNORMAL LOW (ref 3.87–5.11)
RDW: 12.4 % (ref 11.5–15.5)
WBC: 8.5 10*3/uL (ref 4.0–10.5)
nRBC: 0 % (ref 0.0–0.2)

## 2019-03-24 LAB — COMPREHENSIVE METABOLIC PANEL
ALT: 68 U/L — ABNORMAL HIGH (ref 0–44)
AST: 45 U/L — ABNORMAL HIGH (ref 15–41)
Albumin: 3.7 g/dL (ref 3.5–5.0)
Alkaline Phosphatase: 86 U/L (ref 38–126)
Anion gap: 8 (ref 5–15)
BUN: <5 mg/dL — ABNORMAL LOW (ref 6–20)
CO2: 28 mmol/L (ref 22–32)
Calcium: 8.8 mg/dL — ABNORMAL LOW (ref 8.9–10.3)
Chloride: 102 mmol/L (ref 98–111)
Creatinine, Ser: 0.64 mg/dL (ref 0.44–1.00)
GFR calc Af Amer: >60 mL/min (ref >60)
GFR calc non Af Amer: >60 mL/min (ref >60)
Glucose, Bld: 133 mg/dL — ABNORMAL HIGH (ref 70–99)
Potassium: 3.6 mmol/L (ref 3.5–5.1)
Sodium: 138 mmol/L (ref 135–145)
Total Bilirubin: 0.9 mg/dL (ref 0.3–1.2)
Total Protein: 6 g/dL — ABNORMAL LOW (ref 6.5–8.1)

## 2019-03-24 LAB — HIV ANTIBODY (ROUTINE TESTING W REFLEX): HIV Screen 4th Generation wRfx: NONREACTIVE

## 2019-03-24 SURGERY — ERCP, WITH INTERVENTION IF INDICATED
Anesthesia: General

## 2019-03-24 MED ORDER — CIPROFLOXACIN IN D5W 400 MG/200ML IV SOLN
INTRAVENOUS | Status: AC
  Filled 2019-03-24: qty 200

## 2019-03-24 MED ORDER — GLUCAGON HCL RDNA (DIAGNOSTIC) 1 MG IJ SOLR
INTRAMUSCULAR | Status: AC
  Filled 2019-03-24: qty 1

## 2019-03-24 MED ORDER — MIDAZOLAM HCL 2 MG/2ML IJ SOLN
INTRAMUSCULAR | Status: AC
  Filled 2019-03-24: qty 2

## 2019-03-24 MED ORDER — SODIUM CHLORIDE 0.9 % IV SOLN
INTRAVENOUS | Status: DC | PRN
  Administered 2019-03-24: 20 mL

## 2019-03-24 MED ORDER — DEXAMETHASONE SODIUM PHOSPHATE 10 MG/ML IJ SOLN
INTRAMUSCULAR | Status: DC | PRN
  Administered 2019-03-24: 5 mg via INTRAVENOUS

## 2019-03-24 MED ORDER — OXCARBAZEPINE 300 MG PO TABS
300.0000 mg | ORAL_TABLET | Freq: Two times a day (BID) | ORAL | Status: DC
  Administered 2019-03-24 – 2019-03-27 (×7): 300 mg via ORAL
  Filled 2019-03-24 (×7): qty 1

## 2019-03-24 MED ORDER — FENTANYL CITRATE (PF) 100 MCG/2ML IJ SOLN
INTRAMUSCULAR | Status: AC
  Filled 2019-03-24: qty 2

## 2019-03-24 MED ORDER — HYDROXYZINE HCL 25 MG PO TABS: 50.0000 mg | ORAL_TABLET | Freq: Three times a day (TID) | ORAL | Status: DC | PRN

## 2019-03-24 MED ORDER — LIDOCAINE 2% (20 MG/ML) 5 ML SYRINGE
INTRAMUSCULAR | Status: DC | PRN
  Administered 2019-03-24: 100 mg via INTRAVENOUS

## 2019-03-24 MED ORDER — MELATONIN 3 MG PO TABS
6.0000 mg | ORAL_TABLET | Freq: Every day | ORAL | Status: DC
  Administered 2019-03-24 – 2019-03-26 (×3): 6 mg via ORAL
  Filled 2019-03-24 (×3): qty 2

## 2019-03-24 MED ORDER — TECHNETIUM TC 99M MEBROFENIN IV KIT
5.0000 | PACK | Freq: Once | INTRAVENOUS | Status: AC | PRN
  Administered 2019-03-24: 15:00:00 5 via INTRAVENOUS

## 2019-03-24 MED ORDER — ALPRAZOLAM 0.5 MG PO TABS: 0.5000 mg | ORAL_TABLET | Freq: Two times a day (BID) | ORAL | Status: DC | PRN

## 2019-03-24 MED ORDER — INDOMETHACIN 50 MG RE SUPP
RECTAL | Status: DC | PRN
  Administered 2019-03-24: 100 mg via RECTAL

## 2019-03-24 MED ORDER — PIPERACILLIN-TAZOBACTAM 3.375 G IVPB 30 MIN
3.3750 g | Freq: Three times a day (TID) | INTRAVENOUS | Status: DC
  Administered 2019-03-24 – 2019-03-27 (×9): 3.375 g via INTRAVENOUS
  Filled 2019-03-24 (×19): qty 50

## 2019-03-24 MED ORDER — FENTANYL CITRATE (PF) 100 MCG/2ML IJ SOLN
INTRAMUSCULAR | Status: DC | PRN
  Administered 2019-03-24: 100 ug via INTRAVENOUS

## 2019-03-24 MED ORDER — MIDAZOLAM HCL 2 MG/2ML IJ SOLN
INTRAMUSCULAR | Status: DC | PRN
  Administered 2019-03-24: 2 mg via INTRAVENOUS

## 2019-03-24 MED ORDER — DULOXETINE HCL 30 MG PO CPEP
30.0000 mg | ORAL_CAPSULE | Freq: Two times a day (BID) | ORAL | Status: DC
  Administered 2019-03-24 – 2019-03-27 (×7): 30 mg via ORAL
  Filled 2019-03-24 (×7): qty 1

## 2019-03-24 MED ORDER — SUCCINYLCHOLINE CHLORIDE 200 MG/10ML IV SOSY
PREFILLED_SYRINGE | INTRAVENOUS | Status: DC | PRN
  Administered 2019-03-24: 140 mg via INTRAVENOUS

## 2019-03-24 MED ORDER — INDOMETHACIN 50 MG RE SUPP
RECTAL | Status: AC
  Filled 2019-03-24: qty 2

## 2019-03-24 MED ORDER — HYDROXYZINE HCL 25 MG PO TABS
50.0000 mg | ORAL_TABLET | Freq: Two times a day (BID) | ORAL | Status: DC
  Administered 2019-03-24 – 2019-03-27 (×7): 50 mg via ORAL
  Filled 2019-03-24 (×7): qty 2

## 2019-03-24 MED ORDER — GUAIFENESIN 100 MG/5ML PO SOLN
5.0000 mL | ORAL | Status: DC | PRN
  Administered 2019-03-25 – 2019-03-26 (×2): 100 mg via ORAL
  Filled 2019-03-24 (×4): qty 5

## 2019-03-24 MED ORDER — PROPOFOL 10 MG/ML IV BOLUS
INTRAVENOUS | Status: DC | PRN
  Administered 2019-03-24: 150 mg via INTRAVENOUS

## 2019-03-24 MED ORDER — SODIUM CHLORIDE 0.9 % IV SOLN
INTRAVENOUS | Status: DC
  Administered 2019-03-24: 17:00:00 via INTRAVENOUS

## 2019-03-24 MED ORDER — FENTANYL CITRATE (PF) 250 MCG/5ML IJ SOLN: INTRAMUSCULAR | Status: DC | PRN

## 2019-03-24 MED ORDER — LORAZEPAM 1 MG PO TABS
1.0000 mg | ORAL_TABLET | ORAL | Status: DC | PRN
  Administered 2019-03-24 – 2019-03-26 (×11): 1 mg via ORAL
  Filled 2019-03-24: qty 2
  Filled 2019-03-24 (×3): qty 1
  Filled 2019-03-24 (×2): qty 2
  Filled 2019-03-24: qty 1
  Filled 2019-03-24: qty 2
  Filled 2019-03-24 (×2): qty 1
  Filled 2019-03-24: qty 2

## 2019-03-24 MED ORDER — POLYETHYLENE GLYCOL 3350 17 G PO PACK
17.0000 g | PACK | Freq: Every day | ORAL | Status: DC
  Administered 2019-03-25 – 2019-03-27 (×3): 17 g via ORAL
  Filled 2019-03-24 (×3): qty 1

## 2019-03-24 NOTE — Anesthesia Preprocedure Evaluation (Addendum)
Anesthesia Evaluation  Patient identified by MRN, date of birth, ID band Patient awake    Reviewed: Allergy & Precautions, NPO status , Patient's Chart, lab work & pertinent test results  History of Anesthesia Complications Negative for: history of anesthetic complications  Airway Mallampati: II  TM Distance: >3 FB     Dental no notable dental hx. (+) Implants, Dental Advisory Given,    Pulmonary neg pulmonary ROS, former smoker,    Pulmonary exam normal        Cardiovascular negative cardio ROS Normal cardiovascular exam     Neuro/Psych Seizures -,  PSYCHIATRIC DISORDERS Anxiety Depression    GI/Hepatic GERD  Medicated,ACUTE CHOLECYSTITIS   Endo/Other  negative endocrine ROS  Renal/GU negative Renal ROS     Musculoskeletal negative musculoskeletal ROS (+)   Abdominal   Peds  (+) ADHD Hematology negative hematology ROS (+)   Anesthesia Other Findings Day of surgery medications reviewed with the patient.  Reproductive/Obstetrics                            Anesthesia Physical  Anesthesia Plan  ASA: II  Anesthesia Plan: General   Post-op Pain Management:    Induction: Intravenous  PONV Risk Score and Plan: 4 or greater and Diphenhydramine, Midazolam, Dexamethasone and Ondansetron  Airway Management Planned: Oral ETT  Additional Equipment:   Intra-op Plan:   Post-operative Plan: Extubation in OR  Informed Consent: I have reviewed the patients History and Physical, chart, labs and discussed the procedure including the risks, benefits and alternatives for the proposed anesthesia with the patient or authorized representative who has indicated his/her understanding and acceptance.     Dental advisory given  Plan Discussed with: CRNA and Anesthesiologist  Anesthesia Plan Comments:        Anesthesia Quick Evaluation

## 2019-03-24 NOTE — Consult Note (Addendum)
Telepsych Consultation   Reason for Consult:  Anxiety/Panic Attack Referring Physician:  Dr Modena Jansky [facility] [id number] Location of Provider: [facility]  Patient Identification: Annette Cook MRN:  [id number] Principal Diagnosis: <principal problem not specified> Diagnosis:  Active Problems:   Status post laparoscopic cholecystectomy   Total Time spent with patient: 30 minutes  Subjective:   Annette Cook is a 33 y.o. female patient admitted with right sided abdominal pain, nausea and vomiting.  Patient assessed by nurse practitioner, husband Vicente Males, at bedside.  Patient request that husband be present for psychiatric consult.  Patient denies suicidal and homicidal ideations.  Patient denies paranoia and hallucinations.  Patient states "I have had 3 panic attacks since I got here on yesterday."  Patient reports "this hurts like hell it is not something that is in my mind."  Patient reports anxiety and panic triggered by pain.  Patient reports history of ADHD and anxiety.  Patient seen by outpatient psychiatry psychiatrist Barnett Applebaum and therapist Alysia Penna in [facility]. Patient discussed with Dr. Mallie Darting.  HPI: Patient admitted with right-sided abdominal pain, nausea and vomiting.  Past Psychiatric History: ADHD, anxiety  Risk to Self:  No Risk to Others:  No Prior Inpatient Therapy:  No Prior Outpatient Therapy:  Yes  Past Medical History:  Past Medical History:  Diagnosis Date   ADHD (attention deficit hyperactivity disorder)    Anxiety    GERD (gastroesophageal reflux disease)    Insomnia     Past Surgical History:  Procedure Laterality Date   APPENDECTOMY     CHOLECYSTECTOMY N/A 03/17/2019   Procedure: LAPAROSCOPIC CHOLECYSTECTOMY;  Surgeon: Ralene Ok, MD;  Location: [facility] OR;  Service: General;  Laterality: N/A;   TONSILLECTOMY AND ADENOIDECTOMY     WISDOM TOOTH EXTRACTION     Family History:  Family History  Problem  Relation Age of Onset   Hypertension Mother    Stroke Mother    Family Psychiatric  History: Unknown Social History:  Social History   Substance and Sexual Activity  Alcohol Use Yes   Comment: socially     Social History   Substance and Sexual Activity  Drug Use No    Social History   Socioeconomic History   Marital status: Married    Spouse name: Not on file   Number of children: Not on file   Years of education: Not on file   Highest education level: Not on file  Occupational History   Occupation: front Therapist, sports  Tobacco Use   Smoking status: Former Smoker    Packs/day: 1.00    Years: 7.00    Pack years: 7.00    Types: Cigarettes    Quit date: 10/22/2004    Years since quitting: 14.4   Smokeless tobacco: Never Used  Substance and Sexual Activity   Alcohol use: Yes    Comment: socially   Drug use: No   Sexual activity: Yes    Partners: Male  Other Topics Concern   Not on file  Social History Narrative   Marital Status: Married Barrister's clerk)    Children:  Son Nurse, learning disability) Daughter (Remi)    Pets: None    Living Situation: Lives with husband, step-daughter & children.    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      Alcohol Use:  None   Drug  Use:  None   Diet:  Regular   Exercise:  None   Hobbies: Fishing [employer])          Social Determinants of Health   Financial Resource Strain:    Difficulty of Paying Living Expenses: Not on file  Food Insecurity:    Worried About Charity fundraiser in the Last Year: Not on file   [employer] of Food in the Last Year: Not on file  Transportation Needs:    Lack of Transportation (Medical): Not on file   Lack of Transportation (Non-Medical): Not on file  Physical Activity:    Days of Exercise per Week: Not on file   Minutes of Exercise per Session: Not on file  Stress:    Feeling of Stress  : Not on file  Social Connections:    Frequency of Communication with Friends and Family: Not on file   Frequency of Social Gatherings with Friends and Family: Not on file   Attends Religious Services: Not on file   Active Member of Clubs or Organizations: Not on file   Attends Archivist Meetings: Not on file   Marital Status: Not on file   Additional Social History:    Allergies:  No Known Allergies  Labs:  Results for orders placed or performed during the hospital encounter of 03/23/19 (from the past 48 hour(s))  Comprehensive metabolic panel     Status: Abnormal   Collection Time: 03/23/19  7:30 AM  Result Value Ref Range   Sodium 135 135 - 145 mmol/L   Potassium 4.2 3.5 - 5.1 mmol/L   Chloride 98 98 - 111 mmol/L   CO2 29 22 - 32 mmol/L   Glucose, Bld 109 (H) 70 - 99 mg/dL   BUN 8 6 - 20 mg/dL   Creatinine, Ser 0.71 0.44 - 1.00 mg/dL   Calcium 9.0 8.9 - 10.3 mg/dL   Total Protein 5.9 (L) 6.5 - 8.1 g/dL   Albumin 3.6 3.5 - 5.0 g/dL   AST 29 15 - 41 U/L   ALT 64 (H) 0 - 44 U/L   Alkaline Phosphatase 70 38 - 126 U/L   Total Bilirubin 0.7 0.3 - 1.2 mg/dL   GFR calc non Af Amer >60 >60 mL/min   GFR calc Af Amer >60 >60 mL/min   Anion gap 8 5 - 15    Comment: Performed at [facility], [address], [place], [place] [address]  Lipase, blood     Status: None   Collection Time: 03/23/19  7:30 AM  Result Value Ref Range   Lipase 18 11 - 51 U/L    Comment: Performed at [facility] [address] [place], [place] [address]  CBC with Diff     Status: Abnormal   Collection Time: 03/23/19  7:30 AM  Result Value Ref Range   WBC 8.4 4.0 - 10.5 K/uL   RBC 3.89 3.87 - 5.11 MIL/uL   Hemoglobin 11.9 (L) 12.0 - 15.0 g/dL   HCT 36.9 36.0 - 46.0 %   MCV 94.9 80.0 - 100.0 fL   MCH 30.6 26.0 - 34.0 pg   MCHC 32.2 30.0 - 36.0 g/dL   RDW 12.4 11.5 - 15.5 %   Platelets 249 150 - 400 K/uL   nRBC 0.0 0.0 - 0.2 %   Neutrophils Relative % 63 %   Neutro Abs 5.3  1.7 - 7.7 K/uL   Lymphocytes Relative 25 %   Lymphs  Abs 2.1 0.7 - 4.0 K/uL   Monocytes Relative 7 %   Monocytes Absolute 0.5 0.1 - 1.0 K/uL   Eosinophils Relative 4 %   Eosinophils Absolute 0.4 0.0 - 0.5 K/uL   Basophils Relative 1 %   Basophils Absolute 0.0 0.0 - 0.1 K/uL   Immature Granulocytes 0 %   Abs Immature Granulocytes 0.02 0.00 - 0.07 K/uL    Comment: Performed at [facility], [place] [address] [place], [place] [address]  Urinalysis, Routine w reflex microscopic     Status: Abnormal   Collection Time: 03/23/19  8:27 AM  Result Value Ref Range   Color, Urine YELLOW YELLOW   APPearance HAZY (A) CLEAR   Specific Gravity, Urine 1.014 1.005 - 1.030   pH 7.0 5.0 - 8.0   Glucose, UA NEGATIVE NEGATIVE mg/dL   Hgb urine dipstick NEGATIVE NEGATIVE   Bilirubin Urine NEGATIVE NEGATIVE   Ketones, ur NEGATIVE NEGATIVE mg/dL   Protein, ur NEGATIVE NEGATIVE mg/dL   Nitrite NEGATIVE NEGATIVE   Leukocytes,Ua TRACE (A) NEGATIVE   RBC / HPF 0-5 0 - 5 RBC/hpf   WBC, UA 0-5 0 - 5 WBC/hpf   Bacteria, UA RARE (A) NONE SEEN   Squamous Epithelial / LPF 0-5 0 - 5   Amorphous [place] PRESENT     Comment: Performed at [facility], [address], [place], [place] [address]  I-Stat beta hCG blood, ED     Status: None   Collection Time: 03/23/19  8:51 AM  Result Value Ref Range   I-stat hCG, quantitative <5.0 <5 mIU/mL   Comment 3            Comment:   GEST. AGE      CONC.  (mIU/mL)   <=1 WEEK        5 - 50     2 WEEKS       50 - 500     3 WEEKS       100 - 10,000     4 WEEKS     1,000 - 30,000        FEMALE AND NON-PREGNANT FEMALE:     LESS THAN 5 mIU/mL   SARS CORONAVIRUS 2 (TAT 6-24 HRS) Nasopharyngeal Nasopharyngeal Swab     Status: None   Collection Time: 03/23/19  4:04 PM   Specimen: Nasopharyngeal Swab  Result Value Ref Range   SARS Coronavirus 2 NEGATIVE NEGATIVE    Comment: (NOTE) SARS-CoV-2 target nucleic acids are NOT DETECTED. The SARS-CoV-2 RNA is generally  detectable in upper and lower respiratory specimens during the acute phase of infection. Negative results do not preclude SARS-CoV-2 infection, do not rule out co-infections with other pathogens, and should not be used as the sole basis for treatment or other patient management decisions. Negative results must be combined with clinical observations, patient history, and epidemiological information. The expected result is Negative. Fact Sheet for Patients: SugarRoll.be Fact Sheet for Healthcare Providers: https://www.woods-mathews.com/ This test is not yet approved or cleared by the [place] FDA and  has been authorized for detection and/or diagnosis of SARS-CoV-2 by FDA under an Emergency Use Authorization (EUA). This EUA will remain  in effect (meaning this test can be used) for the duration of the COVID-19 declaration under Section 56 4(b)(1) of the Act, 21 U.S.C. section 360bbb-3(b)(1), unless the authorization is terminated or revoked sooner. Performed at [facility], [place] [address], [place], [place] [address]   HIV Antibody (  routine testing w rflx)     Status: None   Collection Time: 03/24/19  2:19 AM  Result Value Ref Range   HIV Screen 4th Generation wRfx NON REACTIVE NON REACTIVE    Comment: Performed at [facility], [place] [address], [place], [place] [address]  Comprehensive metabolic panel     Status: Abnormal   Collection Time: 03/24/19  2:19 AM  Result Value Ref Range   Sodium 138 135 - 145 mmol/L   Potassium 3.6 3.5 - 5.1 mmol/L   Chloride 102 98 - 111 mmol/L   CO2 28 22 - 32 mmol/L   Glucose, Bld 133 (H) 70 - 99 mg/dL   BUN <5 (L) 6 - 20 mg/dL   Creatinine, Ser 0.64 0.44 - 1.00 mg/dL   Calcium 8.8 (L) 8.9 - 10.3 mg/dL   Total Protein 6.0 (L) 6.5 - 8.1 g/dL   Albumin 3.7 3.5 - 5.0 g/dL   AST 45 (H) 15 - 41 U/L   ALT 68 (H) 0 - 44 U/L   Alkaline Phosphatase 86 38 - 126 U/L   Total Bilirubin 0.9 0.3 - 1.2  mg/dL   GFR calc non Af Amer >60 >60 mL/min   GFR calc Af Amer >60 >60 mL/min   Anion gap 8 5 - 15    Comment: Performed at [facility] [address] [place], [place] [address]  CBC     Status: Abnormal   Collection Time: 03/24/19  2:19 AM  Result Value Ref Range   WBC 8.5 4.0 - 10.5 K/uL   RBC 3.84 (L) 3.87 - 5.11 MIL/uL   Hemoglobin 11.7 (L) 12.0 - 15.0 g/dL   HCT 35.5 (L) 36.0 - 46.0 %   MCV 92.4 80.0 - 100.0 fL   MCH 30.5 26.0 - 34.0 pg   MCHC 33.0 30.0 - 36.0 g/dL   RDW 12.4 11.5 - 15.5 %   Platelets 234 150 - 400 K/uL   nRBC 0.0 0.0 - 0.2 %    Comment: Performed at [facility], [place] [address] [place], [place] [address]  Urine rapid drug screen (hosp performed)     Status: Abnormal   Collection Time: 03/24/19 10:31 AM  Result Value Ref Range   Opiates POSITIVE (A) NONE DETECTED   Cocaine NONE DETECTED NONE DETECTED   Benzodiazepines POSITIVE (A) NONE DETECTED   Amphetamines NONE DETECTED NONE DETECTED   Tetrahydrocannabinol NONE DETECTED NONE DETECTED   Barbiturates NONE DETECTED NONE DETECTED    Comment: (NOTE) DRUG SCREEN FOR MEDICAL PURPOSES ONLY.  IF CONFIRMATION IS NEEDED FOR ANY PURPOSE, NOTIFY LAB WITHIN 5 DAYS. LOWEST DETECTABLE LIMITS FOR URINE DRUG SCREEN Drug Class                     Cutoff (ng/mL) Amphetamine and metabolites    1000 Barbiturate and metabolites    200 Benzodiazepine                 [id number] Tricyclics and metabolites     300 Opiates and metabolites        300 Cocaine and metabolites        300 THC                            50 Performed at [facility], [place] [address] [place], [place] [address]     Medications:  Current Facility-Administered Medications  Medication  Dose Route Frequency Provider Last Rate Last Admin   0.9 %  sodium chloride infusion   Intravenous Continuous Meuth, Brooke A, PA-C 125 mL/hr at 03/23/19 1800 Rate Verify at 03/23/19 1800   acetaminophen (TYLENOL) tablet 650 mg  650 mg Oral Q6H PRN  Wellington Hampshire, PA-C   650 mg at 03/24/19 [id number]   Or   acetaminophen (TYLENOL) suppository 650 mg  650 mg Rectal Q6H PRN Meuth, Brooke A, PA-C       diphenhydrAMINE (BENADRYL) capsule 25 mg  25 mg Oral Q6H PRN Meuth, Brooke A, PA-C       Or   diphenhydrAMINE (BENADRYL) injection 25 mg  25 mg Intravenous Q6H PRN Meuth, Brooke A, PA-C       docusate sodium (COLACE) capsule 100 mg  100 mg Oral BID Meuth, Brooke A, PA-C   100 mg at 03/24/19 1100   DULoxetine (CYMBALTA) DR capsule 30 mg  30 mg Oral BID Meuth, Brooke A, PA-C   30 mg at 03/24/19 1114   enoxaparin (LOVENOX) injection 40 mg  40 mg Subcutaneous Q24H Meuth, Brooke A, PA-C   40 mg at 03/23/19 1720   guaiFENesin (ROBITUSSIN) 100 MG/5ML solution 100 mg  5 mL Oral Q4H PRN Meuth, Brooke A, PA-C       HYDROmorphone (DILAUDID) injection 0.5-1 mg  0.5-1 mg Intravenous Q3H PRN Meuth, Brooke A, PA-C   1 mg at 03/24/19 1114   hydrOXYzine (ATARAX/VISTARIL) tablet 50 mg  50 mg Oral BID Meuth, Brooke A, PA-C   50 mg at 03/24/19 1114   LORazepam (ATIVAN) tablet 1 mg  1 mg Oral Q2H PRN Cornett, Marcello Moores, MD   1 mg at 03/24/19 0442   Melatonin TABS 6 mg  6 mg Oral QHS Meuth, Brooke A, PA-C       methocarbamol (ROBAXIN) tablet 500 mg  500 mg Oral Q6H PRN Meuth, Brooke A, PA-C   500 mg at 03/24/19 [id number]   metoprolol tartrate (LOPRESSOR) injection 5 mg  5 mg Intravenous Q6H PRN Meuth, Brooke A, PA-C       ondansetron (ZOFRAN-ODT) disintegrating tablet 4 mg  4 mg Oral Q6H PRN Meuth, Brooke A, PA-C       Or   ondansetron (ZOFRAN) injection 4 mg  4 mg Intravenous Q6H PRN Meuth, Brooke A, PA-C   4 mg at 03/24/19 [id number]   Oxcarbazepine (TRILEPTAL) tablet 300 mg  300 mg Oral BID Meuth, Brooke A, PA-C       oxyCODONE (Oxy IR/ROXICODONE) immediate release tablet 5-10 mg  5-10 mg Oral Q4H PRN Meuth, Brooke A, PA-C   10 mg at 03/24/19 0846   pantoprazole (PROTONIX) injection 40 mg  40 mg Intravenous QHS Meuth, Brooke A, PA-C   40 mg at 03/23/19 2153   [START ON 03/25/2019]  polyethylene glycol (MIRALAX / GLYCOLAX) packet 17 g  17 g Oral Daily Meuth, Brooke A, PA-C       simethicone (MYLICON) chewable tablet 40 mg  40 mg Oral Q6H PRN Meuth, Brooke A, PA-C   40 mg at 03/24/19 0607    Musculoskeletal: Strength & Muscle Tone:  Unable to assess Gait & Station:  Unable to assess Patient leans:  Unable to assess  Psychiatric Specialty Exam: Physical Exam  Nursing note and vitals reviewed. Constitutional: She is oriented to person, place, and time. She appears well-developed.  HENT:  Head: Normocephalic.  Cardiovascular: Normal rate.  Respiratory: Effort normal.  Neurological: She is alert and oriented to person, place,  and time.  Psychiatric: Her speech is normal and behavior is normal. Judgment and thought content normal. Her mood appears anxious. Cognition and memory are normal.    Review of Systems  Constitutional: Negative.   HENT: Negative.   Eyes: Negative.   Respiratory: Negative.   Cardiovascular: Negative.   Gastrointestinal: Negative.   Genitourinary: Negative.   Musculoskeletal: Negative.   Skin: Negative.   Neurological: Negative.   Psychiatric/Behavioral: The patient is nervous/anxious.     Blood pressure 127/81, pulse 96, temperature 98.5 F (36.9 C), temperature source Oral, resp. rate 18, height 5' (1.524 m), weight 57.6 kg, last menstrual period 03/16/2019, SpO2 100 %, currently breastfeeding.Body mass index is 24.8 kg/m.  General Appearance: Casual  Eye Contact:  Good  Speech:  Clear and Coherent and Normal Rate  Volume:  Normal  Mood:  Anxious  Affect:  Appropriate  Thought Process:  Coherent, Goal Directed and Descriptions of Associations: Intact  Orientation:  Full (Time, Place, and Person)  Thought Content:  WDL and Logical  Suicidal Thoughts:  No  Homicidal Thoughts:  No  Memory:  Immediate;   Good Recent;   Good Remote;   Good  Judgement:  Good  Insight:  Good  Psychomotor Activity:  Normal  Concentration:   Concentration: Good and Attention Span: Good  Recall:  Good  Fund of Knowledge:  Good  Language:  Good  Akathisia:  No  Handed:  Right  AIMS (if indicated):     Assets:  Communication Skills Desire for Improvement Financial Resources/Insurance [place] Talents/Skills Transportation Vocational/Educational  ADL's:  Intact  Cognition:  WNL  Sleep:        Treatment Plan Summary: Patient denies all crisis criteria.  Disposition: No evidence of imminent risk to self or others at present.   Patient does not meet criteria for psychiatric inpatient admission. Supportive therapy provided about ongoing stressors.  This service was provided via telemedicine using a 2-way, interactive audio and video technology.  Names of all persons participating in this telemedicine service and their role in this encounter. Name: Kalkidan Marotti Role: Patient  Name: Laymond Purser Role: Patient's husband  Name: Letitia Libra Role: [place], [place] 03/24/2019 1:55 PM  Case discussed and plan agreed upon as per above.

## 2019-03-24 NOTE — Significant Event (Signed)
Rapid Response Event Note  Overview: Chest pain  Initial Focused Assessment: I was notified by nursing staff regarding pt with CP 10/10. Upon my arrival, Annette Cook was emotional and very anxious. She stated she has been hurting "all day" and the medicines are not working. She described her chest pain as sharp and pointed to the middle of her chest. She also endorsed SOB however was able to speak in long sentences without stopping. EKG SR with no STE.  HR 93, 124/67 (84), RR 18 sats 100%  Interventions: -EKG   Plan of Care (if not transferred): -monitor for further CP -Notify primary svc of events and further orders  Event Summary: Call received 0125 Arrived at call 0130 Call ended 0200 Annette Cook

## 2019-03-24 NOTE — Progress Notes (Addendum)
Annette Cook 5:05 PM  Subjective: Patient seen and examined in hospital computer chart reviewed and case discussed with my partner Dr. Alessandra Bevels and she has not had any previous GI problems and she had her gallbladder out a week ago and was fine for a few days but then began having increased pain and finally presented back to the emergency room and work-up showed a bile leak  Objective: Vital signs stable afebrile no acute distress and please see preassessment evaluation labs okay nuclear scan positive for bile leak CT reviewed MRI reviewed incomplete study op note reviewed Assessment: Bile leak  Plan: The risks benefits methods and success rate of ERCP was discussed with the patient and will proceed this evening with further work-up and plans pending those findings  [facility]  office [phone] After 5PM or if no answer call [phone]

## 2019-03-24 NOTE — Progress Notes (Addendum)
Shift summary: Initially, patient was taken to ultrasound via bed. She reported right sided abdominal pain, graded at 10/10, but was just medicated with 10mg  of Oxycodone @ 1829.  Upon returning to room, patient was medicated at 2153 with 1 mg of Dilaudid at her request for reported pain level of 10/10. One hour after medication patient reported a pain level of 10/10. She stated that "the meds aren't working" and requested to see the charge RN.  2303: Medicated with Mylicon at patients request. Requested "Oxycodone and Tylenol together" for stated pain level of 10/10.  0007: Medicated with Miralax at patients request. On questioning, stated her last BM was "yesterday morning." 0008: Medicated with Oxycodone 10 mg po and Robaxin 500 mg at patients request. Stated "can we do Fentanyl patches?" Instructed patient that Fentanyl patches are prescribed primarily for chronic pain to which patient stated, "well, I have chronic pain." Upon questioning patient on her pain history and meds she takes at home, patient pulled a bottle of Percocet from her purse. 0017: Medicated with Ativan and Tylenol at patients request.  0043: Medicated with Zofran at patients request for c/o nausea. Patient holding her cellphone and showed this RN her schedule of "when all my meds are due." 0200: Medicated with Dilaudid 10 mg at patients request for stated pain level of 10/10. Patient stated, "I know you all get lots of drug seekers but that's not what I'm doing." 0206: EKG done at patients request. Stated " I'm having chest pain and it's going to my right shoulder."  Notified by Jarrett Soho, NT that during the EKG, patient stated, "I just need meds and I don't care if you have to give me a suppository or pump my stomach." NT also noted that the patient was charging a vape in the bathroom in the room. Charge nurse, Lovely, was notified and Percocet and vape were removed from room. Percocet was double counted in front of patient with Lovely  present. Percocet bottle was taken to the pharmacy by charge RN. 0255: Robitussin given at patients request for "cough" although no coughing was heard throughout shift. Ativan 1 mg was given for anxiety. Patient requested Dilaudid but was notified that next dose could not be given until 0500 as med is ordered Q3 hours PRN and last dose was given at 0200. 0410: Called to patients room. Apple juice and apple sauce were given at patients request. Upon entering room with juice and applesauce, patient requested "Zofran for nausea." Notified patient that Zofran could not be given at this time as it is ordered Q6 hours PRN and last dose was given at 0443. Patient then stated, "how about Phenergan? Can we do Phenergan?" Notified patient that no more meds would be given until next dose of Dilaudid @ 0500 and instructed on concerns that patient may become overmedicated and BP may drop out. Patient then stated, "well good then check my vital signs then because they haven't been checked." As Elmyra Ricks, NT entered room to check BP, this RN notified patient that BP will be checked prior to any other meds being given. Patient then stated, "I don't want to see you anymore. Get Lovely. Patient then stated "my Oxycodone is due now." This RN notified Lovely that patient wanted to see her. 0442: Oxycodone 10 mg given at patients request by charge RN. [id number]: Mylicon and Dilaudid 1mg  given at patients request by charge RN. 0710: Report given to oncoming RN, Leatrice Jewels.

## 2019-03-24 NOTE — Op Note (Signed)
[facility] Patient Name: Annette Cook Procedure Date : 03/24/2019 MRN: [id number] Attending MD: Clarene Essex , MD Date of Birth: [birth year] CSN: [id number] Age: 33 Admit Type: Inpatient Procedure:                ERCP Indications:              Bile leak, Treatment of bile leak Providers:                Clarene Essex, MD, Josie Dixon, RN, Vista Lawman,                            RN, Lazaro Arms, Technician Referring MD:              Medicines:                General Anesthesia Complications:            No immediate complications. Estimated Blood Loss:     Estimated blood loss: none. Procedure:                Pre-Anesthesia Assessment:                           - Prior to the procedure, a History and Physical                            was performed, and patient medications and                            allergies were reviewed. The patient's tolerance of                            previous anesthesia was also reviewed. The risks                            and benefits of the procedure and the sedation                            options and risks were discussed with the patient.                            All questions were answered, and informed consent                            was obtained. Prior Anticoagulants: The patient has                            taken no previous anticoagulant or antiplatelet                            agents. ASA Grade Assessment: I - A normal, healthy                            patient. After reviewing the risks and benefits,  the patient was deemed in satisfactory condition to                            undergo the procedure.                           After obtaining informed consent, the scope was                            passed under direct vision. Throughout the                            procedure, the patient's blood pressure, pulse, and                            oxygen saturations were monitored  continuously. The                            [id number] Olympus duodenoscope was                            introduced through the mouth, and used to inject                            contrast into and used to cannulate the bile duct.                            The ERCP was accomplished without difficulty. The                            patient tolerated the procedure well. Scope In: 5:26:33 PM Scope Out: 5:46:04 PM Total Procedure Duration: 0 hours 19 minutes 31 seconds  Findings:      The major papilla was normal. Deep selective cannulation was readily       obtained and there was no pancreatic duct injection or wire advancement       and on initial cholangiogram we did not see an obvious leak but we       proceeded with a biliary sphincterotomy was made with a Hydratome       sphincterotome using ERBE electrocautery. There was no       post-sphincterotomy bleeding. We did a sphincterotomy until we had       adequate biliary drainage and could get the fully bowed sphincterotome       easily in and out of the duct and to discover objects and possibly       demonstrate a leak, the biliary tree was swept with a 12 mm balloon       starting at the bifurcation. Nothing was found in the duct but we       believe we confirmed the cystic duct leak although our fluoroscopy       images were very light and after 2 balloon pull-through's which passed       readily through the patent sphincterotomy site we elected to place One       10 Fr by 5 cm plastic stent with a single external flap and a single  internal flap was placed 4.5 cm into the common bile duct. The stent was       in good position. Impression:               - The major papilla appeared normal.                           - A biliary sphincterotomy was performed.                           - The biliary tree was swept and nothing was found.                           - One plastic stent was placed into the common bile                             duct. No pancreatic duct wire advancement or                            injections were done throughout the procedure Recommendation:           - Clear liquid diet today. If doing well can                            advance diet tomorrow                           - Continue present medications.                           - Return to GI clinic PRN. If stent is not helpful                            consider radiographically placed drain                           - Telephone GI clinic if symptomatic PRN. If she                            does well follow-up with me in 1 month to set up                            stent removal in roughly 6 weeks Procedure Code(s):        --- Professional ---                           [phone], Endoscopic retrograde                            cholangiopancreatography (ERCP); with placement of                            endoscopic stent into biliary or pancreatic duct,  including pre- and post-dilation and guide wire                            passage, when performed, including sphincterotomy,                            when performed, each stent Diagnosis Code(s):        --- Professional ---                           K83.8, Other specified diseases of biliary tract CPT copyright 2019 [employer]. All rights reserved. The codes documented in this report are preliminary and upon coder review may  be revised to meet current compliance requirements. Clarene Essex, MD 03/24/2019 5:59:21 PM This report has been signed electronically. Number of Addenda: 0

## 2019-03-24 NOTE — Anesthesia Procedure Notes (Signed)
Procedure Name: Intubation Date/Time: 03/24/2019 5:10 PM Performed by: Janace Litten, CRNA Pre-anesthesia Checklist: Patient identified, Emergency Drugs available, Suction available and Patient being monitored Patient Re-evaluated:Patient Re-evaluated prior to induction Oxygen Delivery Method: Circle System Utilized Preoxygenation: Pre-oxygenation with 100% oxygen Induction Type: IV induction Laryngoscope Size: Mac and 3 Grade View: Grade I Tube type: Oral Tube size: 7.0 mm Number of attempts: 1 Airway Equipment and Method: Stylet Placement Confirmation: ETT inserted through vocal cords under direct vision,  positive ETCO2 and breath sounds checked- equal and bilateral Secured at: 21 cm Tube secured with: Tape Dental Injury: Teeth and Oropharynx as per pre-operative assessment

## 2019-03-24 NOTE — Progress Notes (Signed)
    CC:  Subjective: Nursing notes state patient has been agitated complaining of pain and requesting fentanyl, they found her with a vape.  Asking for multiple different medications. States that she continues to have severe RUQ pain radiating into her shoulder. She feels nauseated but is also starving. She is currently sitting in her room naked. Tearful and asking for more pain medications.  Objective: Vital signs in last 24 hours: Temp:  [97.7 F (36.5 C)-98.6 F (37 C)] 98.5 F (36.9 C) (12/10 0418) Pulse Rate:  [79-99] 96 (12/10 0418) Resp:  [14-20] 18 (12/10 0051) BP: (99-129)/(55-84) 127/81 (12/10 0418) SpO2:  [99 %-100 %] 100 % (12/10 0418) Weight:  [57.6 kg] 57.6 kg (12/09 1723) Last BM Date: 03/22/19 P.o. to 40 1275 IV Nothing else recorded Afebrile vital signs are stable Labs are not remarkable. AST 45, ALT 68, total bilirubin 0.9 WBC 8.5 H&H 11.7/35.5 CT scan shows cholecystectomy with no acute findings. Abdominal ultrasound no evidence of common bile duct stone CBD is only 6.1 mm MRI no acute findings. Novick ultrasound was normal. Intake/Output from previous day: 12/09 0701 - 12/10 0700 In: 1275.9 [P.O.:240; I.V.:35.9; IV Piggyback:1000] Out: -  Intake/Output this shift: No intake/output data recorded.  Gen: Alert, tearful Pulm: some tachypneic Cardio: RRR Abd: soft, nondistended, +BS, lap incisions mostly cdi but periumbilical incision slightly open/ nodrainage, diffuse tenderness mostly in the RUQ  Lab Results:  Recent Labs    03/23/19 0730 03/24/19 0219  WBC 8.4 8.5  HGB 11.9* 11.7*  HCT 36.9 35.5*  PLT 249 234    BMET Recent Labs    03/23/19 0730 03/24/19 0219  NA 135 138  K 4.2 3.6  CL 98 102  CO2 29 28  GLUCOSE 109* 133*  BUN 8 <5*  CREATININE 0.71 0.64  CALCIUM 9.0 8.8*   PT/INR No results for input(s): LABPROT, INR in the last 72 hours.  Recent Labs  Lab 03/17/19 1113 03/17/19 1450 03/23/19 0730 03/24/19 0219  AST 31*  35 29 45*  ALT 34* 41 64* 68*  ALKPHOS  --  50 70 86  BILITOT 0.3 0.4 0.7 0.9  PROT 6.8 6.8 5.9* 6.0*  ALBUMIN  --  4.5 3.6 3.7     Lipase     Component Value Date/Time   LIPASE 18 03/23/2019 0730     Medications: . docusate sodium  100 mg Oral BID  . enoxaparin (LOVENOX) injection  40 mg Subcutaneous Q24H  . pantoprazole (PROTONIX) IV  40 mg Intravenous QHS    Assessment/Plan ADHD Hx generalized anxiety disorder/Hx panic attack Hx PTSD Hx AMS 12/2018 questionable drug use Hx primary insomnia  S/p laparoscopic cholecystectomy 03/17/19 Dr. Rosendo Gros Postop abdominal pain/nausea and vomiting - CT, u/s, and MRCP essentially normal - Transaminases are mildly elevated, will order HIDA to rule out leak.  Home psych medications reordered. Will ask psychiatry to see.  ID - none FEN - IVF, NPO Foley - none Follow up - DOW/TBD   LOS: 0 days    Wellington Hampshire, [facility] 03/24/2019, 10:32 AM Please see Amion for pager number during day hours 7:00am-4:30pm

## 2019-03-24 NOTE — Transfer of Care (Signed)
Immediate Anesthesia Transfer of Care Note  Patient: Annette Cook  Procedure(s) Performed: ENDOSCOPIC RETROGRADE CHOLANGIOPANCREATOGRAPHY (ERCP) (N/A ) SPHINCTEROTOMY REMOVAL OF STONES BILIARY STENT PLACEMENT  Patient Location: PACU  Anesthesia Type:General  Level of Consciousness: awake, alert  and patient cooperative  Airway & Oxygen Therapy: Patient Spontanous Breathing  Post-op Assessment: Report given to RN and Post -op Vital signs reviewed and stable  Post vital signs: Reviewed and stable  Last Vitals:  Vitals Value Taken Time  BP    Temp    Pulse    Resp    SpO2      Last Pain:  Vitals:   03/24/19 1654  TempSrc: Temporal  PainSc: 10-Worst pain ever      Patients Stated Pain Goal: 2 ([id number])  Complications: No apparent anesthesia complications

## 2019-03-24 NOTE — Progress Notes (Signed)
+   fluid leak on the HIDA scan.  Will call GI for ERCP urgently.

## 2019-03-24 NOTE — Progress Notes (Signed)
This nurse was requested to patient bedside by patient asking to speak with charge. Upon arrival, patient states that we aren't addressing her problems and she just needs to poop. This nurse reminded patient that this nurse administered miralax during mid shift and also that patient endorsed she had a BM yesterday. This nurse has encouraged patient all night to walk hallways to help with patient complaint of what appears to be gas pain and again encouraged patient to walk the halls offering to walk with patient. Patient again refused to walk hallways. Patient states primary nurse wasn't meeting her needs and did not bring her pain medication oxycodone when it was due. This nurse again reminded patient how PRN medications work. Patient states primary nurse slammed the door and does not want care from that nurse from this point on. This nurse offered to get patient her pain medication. Patient then requested this nurse to give her a urinary catheter because patient states, "I have to get up to use the bathroom too often and I am tired of it." This nurse explained to patient we only insert said instrument when there is an indication for it. Patient then states, "I almost fell when I got up to go to the bathroom last time I went." This nurse told patient staff will assist her to the bathroom when needed for safety and again educated on importance of walking to get bowels to start moving.

## 2019-03-24 NOTE — Consult Note (Signed)
Referring Provider:  [facility] Primary Care Physician:  Lavada Mesi Primary Gastroenterologist: Althia Forts  Reason for Consultation: Bile leak  HPI: Annette Cook is a 33 y.o. female presented to the hospital with abdominal pain, nausea and vomiting.  Patient underwent laparoscopic cholecystectomy on March 17, 2019 for possible early acute cholecystitis.  Because of ongoing abdominal pain, CT abdomen pelvis with IV contrast was ordered yesterday which showed mild dilated CBD.  She also underwent MRI MRCP yesterday, study was limited.  No CBD obstruction was identified.  Because of ongoing abdominal pain, HIDA scan was ordered which came back positive for bile leak.  GI is consulted for ERCP.   Patient seen and examined at bedside.  Appears to be in significant distress from abdominal pain.  Denies any vomiting.  Denies any diarrhea or constipation.  Denies any blood in the stool or black stool.  She is afebrile but anxious.    Past Medical History:  Diagnosis Date  . ADHD (attention deficit hyperactivity disorder)   . Anxiety   . GERD (gastroesophageal reflux disease)   . Insomnia     Past Surgical History:  Procedure Laterality Date  . APPENDECTOMY    . CHOLECYSTECTOMY N/A 03/17/2019   Procedure: LAPAROSCOPIC CHOLECYSTECTOMY;  Surgeon: Ralene Ok, MD;  Location: [place];  Service: General;  Laterality: N/A;  . TONSILLECTOMY AND ADENOIDECTOMY    . WISDOM TOOTH EXTRACTION      Prior to Admission medications   Medication Sig Start Date End Date Taking? Authorizing Provider  ALPRAZolam Duanne Moron) 0.5 MG tablet Take 0.5 mg by mouth daily as needed for anxiety. 11/03/18  Yes [provider]  DULoxetine (CYMBALTA) 30 MG capsule Take 30 mg by mouth 2 (two) times daily. 03/14/19  Yes [provider]  hydrOXYzine (ATARAX/VISTARIL) 50 MG tablet Take 1 tablet (50 mg total) by mouth every 8 (eight) hours as needed for anxiety. Patient taking differently: Take 50-100 mg  by mouth 2 (two) times daily. Take 50 mg in the morning and 100 mg in the evening 01/18/18  Yes Breeback, Jade L, PA-C  Melatonin 5 MG CAPS Take 5 mg by mouth at bedtime.   Yes [provider]  Multiple Vitamins-Minerals (MULTIVITAMIN ADULT PO) Take 1 tablet by mouth at bedtime.   Yes [provider]  Oxcarbazepine (TRILEPTAL) 300 MG tablet Take 300 mg by mouth 2 (two) times daily. 02/11/19  Yes [provider]  oxyCODONE-acetaminophen (PERCOCET/ROXICET) 5-325 MG tablet Take 1 tablet by mouth 5 (five) times daily as needed. 03/18/19  Yes [provider]  pantoprazole (PROTONIX) 40 MG tablet Take 40 mg by mouth daily.   Yes [provider]  tiZANidine (ZANAFLEX) 2 MG tablet Take 2 mg by mouth at bedtime.  03/14/19  Yes [provider]  traMADol (ULTRAM) 50 MG tablet Take 1 tablet (50 mg total) by mouth every 6 (six) hours as needed. 03/17/19 03/16/20 Yes Ralene Ok, MD  valACYclovir (VALTREX) 500 MG tablet TAKE 1 TABLET BY MOUTH EVERYDAY AT BEDTIME Patient taking differently: Take 500 mg by mouth at bedtime.  02/25/19  Yes Breeback, Jade L, PA-C  famotidine (PEPCID) 20 MG tablet Take 1 tablet (20 mg total) by mouth 2 (two) times daily. Patient not taking: Reported on 03/23/2019 02/25/19   Donella Stade, PA-C    Scheduled Meds: . docusate sodium  100 mg Oral BID  . DULoxetine  30 mg Oral BID  . enoxaparin (LOVENOX) injection  40 mg Subcutaneous Q24H  .  hydrOXYzine  50 mg Oral BID  . Melatonin  6 mg Oral QHS  . Oxcarbazepine  300 mg Oral BID  . pantoprazole (PROTONIX) IV  40 mg Intravenous QHS  . [START ON 03/25/2019] polyethylene glycol  17 g Oral Daily   Continuous Infusions: . sodium chloride 125 mL/hr at 03/23/19 1800   PRN Meds:.acetaminophen **OR** acetaminophen, diphenhydrAMINE **OR** diphenhydrAMINE, guaiFENesin, HYDROmorphone (DILAUDID) injection, LORazepam, methocarbamol, metoprolol tartrate, ondansetron **OR** ondansetron  (ZOFRAN) IV, oxyCODONE, simethicone  Allergies as of 03/23/2019  . (No Known Allergies)    Family History  Problem Relation Age of Onset  . Hypertension Mother   . Stroke Mother     Social History   Socioeconomic History  . Marital status: Married    Spouse name: Not on file  . Number of children: Not on file  . Years of education: Not on file  . Highest education level: Not on file  Occupational History  . Occupation: Network engineer  Tobacco Use  . Smoking status: Former Smoker    Packs/day: 1.00    Years: 7.00    Pack years: 7.00    Types: Cigarettes    Quit date: 10/22/2004    Years since quitting: 14.4  . Smokeless tobacco: Never Used  Substance and Sexual Activity  . Alcohol use: Yes    Comment: socially  . Drug use: No  . Sexual activity: Yes    Partners: Male  Other Topics Concern  . Not on file  Social History Narrative   Marital Status: Married Barrister's clerk)    Children:  Son Nurse, learning disability) Daughter (Remi)    Pets: None    Living Situation: Lives with husband, step-daughter & children.    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      Alcohol Use:  None   Drug Use:  None   Diet:  Regular   Exercise:  None   Hobbies: Fishing [employer])          Social Determinants of Health   Financial Resource Strain:   . Difficulty of Paying Living Expenses: Not on file  Food Insecurity:   . Worried About Charity fundraiser in the Last Year: Not on file  . Ran Out of Food in the Last Year: Not on file  Transportation Needs:   . Lack of Transportation (Medical): Not on file  . Lack of Transportation (Non-Medical): Not on file  Physical Activity:   . Days of Exercise per Week: Not on file  . Minutes of Exercise per Session: Not on file  Stress:   . Feeling of Stress : Not on file  Social Connections:   . Frequency of Communication with  Friends and Family: Not on file  . Frequency of Social Gatherings with Friends and Family: Not on file  . Attends Religious Services: Not on file  . Active Member of Clubs or Organizations: Not on file  . Attends Archivist Meetings: Not on file  . Marital Status: Not on file  Intimate Partner Violence:   . Fear of Current or Ex-Partner: Not on file  . Emotionally Abused: Not on file  . Physically Abused: Not on file  . Sexually Abused: Not on file    Review of Systems: Review of Systems  Constitutional: Negative for chills and fever.  HENT: Negative for hearing loss and tinnitus.  Eyes: Negative for blurred vision and double vision.  Respiratory: Negative for cough, hemoptysis and sputum production.   Cardiovascular: Negative for chest pain and palpitations.  Gastrointestinal: Positive for abdominal pain, heartburn and nausea. Negative for blood in stool, constipation, diarrhea, melena and vomiting.  Genitourinary: Negative for dysuria and urgency.  Musculoskeletal: Positive for back pain. Negative for myalgias.  Skin: Negative for itching and rash.  Neurological: Negative for seizures and loss of consciousness.  Endo/Heme/Allergies: Does not bruise/bleed easily.  Psychiatric/Behavioral: Negative for substance abuse. The patient is nervous/anxious.     Physical Exam: Vital signs: Vitals:   03/24/19 0051 03/24/19 0418  BP: 124/67 127/81  Pulse: 93 96  Resp: 18   Temp: 98.5 F (36.9 C) 98.5 F (36.9 C)  SpO2: 100% 100%   Last BM Date: 03/22/19 Physical Exam  Constitutional: She is oriented to person, place, and time. She appears well-developed and well-nourished. She appears distressed.  Mild distress  HENT:  Head: Normocephalic and atraumatic.  Mouth/Throat: Oropharynx is clear and moist.  Eyes: EOM are normal. No scleral icterus.  Cardiovascular: Normal rate, regular rhythm and normal heart sounds.  Pulmonary/Chest: Effort normal and breath sounds normal.  No respiratory distress.  Abdominal: Soft. She exhibits no distension. There is abdominal tenderness. There is guarding.  Generalized abdominal tenderness with guarding.  Musculoskeletal:        General: No edema. Normal range of motion.     Cervical back: Normal range of motion and neck supple.  Neurological: She is alert and oriented to person, place, and time.  Skin: Skin is warm. No erythema.  Psychiatric: She has a normal mood and affect. Judgment and thought content normal.   GI:  Lab Results: Recent Labs    03/23/19 0730 03/24/19 0219  WBC 8.4 8.5  HGB 11.9* 11.7*  HCT 36.9 35.5*  PLT 249 234   BMET Recent Labs    03/23/19 0730 03/24/19 0219  NA 135 138  K 4.2 3.6  CL 98 102  CO2 29 28  GLUCOSE 109* 133*  BUN 8 <5*  CREATININE 0.71 0.64  CALCIUM 9.0 8.8*   LFT Recent Labs    03/24/19 0219  PROT 6.0*  ALBUMIN 3.7  AST 45*  ALT 68*  ALKPHOS 86  BILITOT 0.9   PT/INR No results for input(s): LABPROT, INR in the last 72 hours.   Studies/Results: CT ABDOMEN PELVIS W CONTRAST  Result Date: 03/23/2019 CLINICAL DATA:  Nausea vomiting, abdominal pain EXAM: CT ABDOMEN AND PELVIS WITH CONTRAST TECHNIQUE: Multidetector CT imaging of the abdomen and pelvis was performed using the standard protocol following bolus administration of intravenous contrast. CONTRAST:  155mL OMNIPAQUE IOHEXOL 300 MG/ML  SOLN COMPARISON:  Ultrasound March 17, 2019 FINDINGS: Lower chest: The visualized heart size within normal limits. No pericardial fluid/thickening. No hiatal hernia. The visualized portions of the lungs are clear. Hepatobiliary: The patient is status post cholecystectomy. Minimal amount of fat stranding changes seen within the gallbladder bed. No loculated fluid collections are seen. There is a mildly dilated common bile duct measuring up to 10 mm. No definite calcified stones are seen within the common bile duct.The main portal vein is patent. Pancreas: Unremarkable. No  pancreatic ductal dilatation or surrounding inflammatory changes. Spleen: Normal in size without focal abnormality. Adrenals/Urinary Tract: Both adrenal glands appear normal. The kidneys and collecting system appear normal without evidence of urinary tract calculus or hydronephrosis. Bladder is unremarkable. Stomach/Bowel: The stomach, small bowel, and colon are normal in appearance. No  inflammatory changes, wall thickening, or obstructive findings.The appendix is normal. Vascular/Lymphatic: There are no enlarged mesenteric, retroperitoneal, or pelvic lymph nodes. No significant vascular findings are present. Reproductive: The uterus and adnexa are unremarkable. Other:there is a small amount of free fluid seen within the deep pelvis. There is air seen within the right anterior abdominal wall musculature as well as subcutaneous emphysema. There is also fat stranding changes seen at the umbilicus likely postsurgical changes. Musculoskeletal: No acute or significant osseous findings. IMPRESSION: 1. Status post cholecystectomy with amount of fat stranding changes in the gallbladder bed. No definite loculated fluid collections however are noted. 2. Mildly dilated common bile duct without definite evidence choledocholithiasis. However if further evaluation is required would recommend MRCP. 3. Subcutaneous emphysema over the right anterior abdominal with fat stranding at the umbilicus, likely postsurgical changes. Electronically Signed   By: Prudencio Pair M.D.   On: 03/23/2019 10:38   MR ABDOMEN MRCP WO CONTRAST  Result Date: 03/23/2019 CLINICAL DATA:  Dilated common bile duct. Prior cholecystectomy. Cholecystectomy several days prior (03/17/2019 EXAM: MRI ABDOMEN WITHOUT CONTRAST  (INCLUDING MRCP) TECHNIQUE: Multiplanar multisequence MR imaging of the abdomen was performed. Heavily T2-weighted images of the biliary and pancreatic ducts were obtained, and three-dimensional MRCP images were rendered by post processing.  COMPARISON:  CT 01/21/2019, ultrasound 03/17/2019 FINDINGS: Patient refused to complete entirety of exam due to complaints of pain. Pain medications were administered. Patient still could not completely exam. The MRCP sequences are degraded by patient motion. Lower chest:  Lung bases are clear. Hepatobiliary: No intrahepatic biliary duct dilatation. The common hepatic duct measures 8 mm. The common bile duct measures 8 mm. The common bile duct tapers smoothly to the ampulla. No filling defect is seen within the common bile duct. There is small amount of fluid within the gallbladder fossa. Fluid does not appear organized (image 10/7). A small amount fluid along the posterior margin RIGHT hepatic lobe additionally (image 28/5). This volume of fluid is felt to be within normal limits following cholecystectomy. There is a small hyperintense lesion LEFT lateral hepatic lobe measuring 1 cm (114/3) most consistent benign cysts or hemangioma. Pancreas: Normal pancreatic parenchymal intensity. No ductal dilatation or inflammation. Spleen: Normal spleen. Adrenals/urinary tract: Adrenal glands and kidneys are normal. Stomach/Bowel: Stomach and limited of the small bowel is unremarkable Vascular/Lymphatic: Abdominal aortic normal caliber. No retroperitoneal periportal lymphadenopathy. Musculoskeletal: No aggressive osseous lesion. Subcutaneous gas in the RIGHT abdominal wall likely related to laparoscopic surgery. Gas better appreciated on comparison CT. IMPRESSION: 1. Incomplete exam. Patient reported pain which which prohibited complete exam. Pain medications were administered; however, the patient still could not complete exam. 2. With this caveat, the common hepatic duct and common bile duct are mildly dilated. No filling defect within the bile duct identified. No intrahepatic duct dilatation present. 3. There is small amount of fluid within the gallbladder fossa and along the posterior margin of the RIGHT hepatic lobe  which is felt to be within normal limits following cholecystectomy. No organized collections. 4. Normal pancreas without evidence of pancreatitis. 5. Subcutaneous gas in the RIGHT abdominal wall related to laparoscopic. Better appreciated on CT. Electronically Signed   By: Suzy Bouchard M.D.   On: 03/23/2019 13:57   US PELVIC COMPLETE W TRANSVAGINAL AND TORSION R/O  Result Date: 03/23/2019 CLINICAL DATA:  Pelvic pain since this morning, RIGHT lower quadrant pain for 1 day, prior cholecystectomy EXAM: TRANSABDOMINAL AND TRANSVAGINAL ULTRASOUND OF PELVIS DOPPLER ULTRASOUND OF OVARIES TECHNIQUE: Both transabdominal and transvaginal  ultrasound examinations of the pelvis were performed. Transabdominal technique was performed for global imaging of the pelvis including uterus, ovaries, adnexal regions, and pelvic cul-de-sac. It was necessary to proceed with endovaginal exam following the transabdominal exam to visualize the endometrium and ovaries. Color and duplex Doppler ultrasound was utilized to evaluate blood flow to the ovaries. COMPARISON:  None FINDINGS: Uterus Measurements: 7.6 x 3.1 x 3.7 cm = volume: 44 mL. Anteverted. Normal morphology without mass Endometrium Thickness: 3 mm.  No endometrial fluid or focal abnormality Right ovary Measurements: 2.8 x 1.7 x 2.2 cm = volume: 5.6 mL. Normal morphology without mass. Blood flow present within RIGHT ovary on color Doppler imaging. Left ovary Measurements: 2.0 x 1.6 x 1.0 cm = volume: 1.7 mL. Normal morphology without mass. Blood flow present within LEFT ovary on color Doppler imaging. Pulsed Doppler evaluation of both ovaries demonstrates normal low-resistance arterial and venous waveforms. Other findings Trace free pelvic fluid.  No adnexal masses. IMPRESSION: Normal exam. Electronically Signed   By: Lavonia Dana M.D.   On: 03/23/2019 20:38   NM HEPATO BILIARY LEAK  Result Date: 03/24/2019 CLINICAL DATA:  Abdominal pain following cholecystectomy.  Cholecystectomy 03/17/2019 EXAM: NUCLEAR MEDICINE HEPATOBILIARY IMAGING TECHNIQUE: Sequential images of the abdomen were obtained out to 60 minutes following intravenous administration of radiopharmaceutical. RADIOPHARMACEUTICALS:  4.8 mCi Tc-2m  Choletec IV COMPARISON:  MRI 03/23/2019, CT 03/23/2019 FINDINGS: Prompt clearance radiotracer from the blood pool and homogeneous uptake in liver. At 20 minutes counts begin to collect over the bear space the liver prior to entering the small bowel. There is evidence of extraluminal radiotracer inferior to the RIGHT hepatic lobe by 30 minutes of imaging. Activity also appears in the LEFT lower quadrant which is felt to represent intraperitoneal free radiotracer rather than bowel activity. Minimal radiotracer enters the small bowel. IMPRESSION: Active biliary leak which is relatively high volume. The majority of the excreted bile radiotracer accumulates in the peritoneal space rather passing into the small bowel. Initial activity collects over the bear space of the liver with subsequent spilling into the peritoneal space. In comparison to MRI of 03/23/2019 there is small amount of fluid along the pericolic gutters and RIGHT hepatic lobe which presumably represents bile. The exam was incomplete which did limit sensitivity. No organized collection of bile present. Findings conveyed toWILLARD JENNINGS on 03/24/2019  at14:45. Electronically Signed   By: Suzy Bouchard M.D.   On: 03/24/2019 14:58   US Abdomen Limited RUQ  Result Date: 03/23/2019 CLINICAL DATA:  Abdominal pain with nausea and vomiting. Cholecystectomy on 03/17/2019. EXAM: ULTRASOUND ABDOMEN LIMITED RIGHT UPPER QUADRANT COMPARISON:  CT scan of the abdomen dated 03/23/2019 FINDINGS: Gallbladder: Removed Common bile duct: Diameter: 6.1 mm. No evidence of common bile duct stone. No dilated intrahepatic bile ducts. Liver: No focal lesion identified. Within normal limits in parenchymal echogenicity. Portal vein  is patent on color Doppler imaging with normal direction of blood flow towards the liver. Other: There is a tiny amount of free fluid adjacent to the posterior aspect of the right lobe of the liver, likely postsurgical. IMPRESSION: No evidence of common bile duct stone. Common bile duct on this ultrasound exam measures only 6.1 mm in diameter with no dilated intrahepatic bile ducts. This is within normal limits for a post cholecystectomy patient. Electronically Signed   By: Lorriane Shire M.D.   On: 03/23/2019 11:54    Impression/Plan: -Bile leak. -Severe abdominal pain.  Could be from bile leak and /or  peritonitis.  She is afebrile.  No leukocytosis but she has significant tenderness on physical exam as well as guarding. -S/p laparoscopic cholecystectomy on December 3.  Recommendations -------------------------- -Plan for urgent ERCP today. -Start Zosyn -Keep patient n.p.o. for now -Repeat CBC CMP in the morning  Risks (post ERCP pancreatitis, bleeding, infection, bowel perforation that could require surgery, sedation-related changes in cardiopulmonary systems), benefits (identification and possible treatment of source of symptoms, exclusion of certain causes of symptoms), and alternatives (watchful waiting, radiographic imaging studies, empiric medical treatment)  were explained to patient and family in detail and patient wishes to proceed.    LOS: 0 days   Otis Brace  MD, FACP 03/24/2019, 3:07 PM  Contact #  [phone]

## 2019-03-24 NOTE — Progress Notes (Signed)
While at patient bedside, patient states, I am having chest pain that feels like pins and needles. I have had two aunts who passed away from strokes. I am having a stroke, I need an EKG. " This nurse did stroke assessment and at this time no s/s of stroke.    This nurse paged Rapid Response and EKG ordered and obtained.

## 2019-03-25 LAB — COMPREHENSIVE METABOLIC PANEL
ALT: 107 U/L — ABNORMAL HIGH (ref 0–44)
AST: 101 U/L — ABNORMAL HIGH (ref 15–41)
Albumin: 3.4 g/dL — ABNORMAL LOW (ref 3.5–5.0)
Alkaline Phosphatase: 159 U/L — ABNORMAL HIGH (ref 38–126)
Anion gap: 11 (ref 5–15)
BUN: <5 mg/dL — ABNORMAL LOW (ref 6–20)
CO2: 27 mmol/L (ref 22–32)
Calcium: 9 mg/dL (ref 8.9–10.3)
Chloride: 102 mmol/L (ref 98–111)
Creatinine, Ser: 0.68 mg/dL (ref 0.44–1.00)
GFR calc Af Amer: >60 mL/min (ref >60)
GFR calc non Af Amer: >60 mL/min (ref >60)
Glucose, Bld: 129 mg/dL — ABNORMAL HIGH (ref 70–99)
Potassium: 3.8 mmol/L (ref 3.5–5.1)
Sodium: 140 mmol/L (ref 135–145)
Total Bilirubin: 1.7 mg/dL — ABNORMAL HIGH (ref 0.3–1.2)
Total Protein: 6.1 g/dL — ABNORMAL LOW (ref 6.5–8.1)

## 2019-03-25 LAB — CBC
HCT: 35.5 % — ABNORMAL LOW (ref 36.0–46.0)
Hemoglobin: 11.4 g/dL — ABNORMAL LOW (ref 12.0–15.0)
MCH: 30.2 pg (ref 26.0–34.0)
MCHC: 32.1 g/dL (ref 30.0–36.0)
MCV: 94.2 fL (ref 80.0–100.0)
Platelets: 274 10*3/uL (ref 150–400)
RBC: 3.77 MIL/uL — ABNORMAL LOW (ref 3.87–5.11)
RDW: 12.4 % (ref 11.5–15.5)
WBC: 14.6 10*3/uL — ABNORMAL HIGH (ref 4.0–10.5)
nRBC: 0 % (ref 0.0–0.2)

## 2019-03-25 LAB — SURGICAL PATHOLOGY

## 2019-03-25 MED ORDER — MORPHINE SULFATE (PF) 2 MG/ML IV SOLN
2.0000 mg | INTRAVENOUS | Status: DC | PRN
  Administered 2019-03-25: 2 mg via INTRAVENOUS
  Administered 2019-03-25: 4 mg via INTRAVENOUS
  Administered 2019-03-25 – 2019-03-26 (×3): 2 mg via INTRAVENOUS
  Filled 2019-03-25: qty 2
  Filled 2019-03-25 (×5): qty 1

## 2019-03-25 MED ORDER — METHOCARBAMOL 500 MG PO TABS
500.0000 mg | ORAL_TABLET | Freq: Three times a day (TID) | ORAL | Status: DC
  Administered 2019-03-25 – 2019-03-27 (×6): 500 mg via ORAL
  Filled 2019-03-25 (×6): qty 1

## 2019-03-25 MED ORDER — BISACODYL 10 MG RE SUPP
10.0000 mg | Freq: Once | RECTAL | Status: AC
  Administered 2019-03-25: 10 mg via RECTAL
  Filled 2019-03-25: qty 1

## 2019-03-25 MED ORDER — PANTOPRAZOLE SODIUM 40 MG IV SOLR
40.0000 mg | Freq: Two times a day (BID) | INTRAVENOUS | Status: DC
  Administered 2019-03-25 – 2019-03-27 (×5): 40 mg via INTRAVENOUS
  Filled 2019-03-25 (×5): qty 40

## 2019-03-25 MED ORDER — ACETAMINOPHEN 325 MG PO TABS
650.0000 mg | ORAL_TABLET | Freq: Four times a day (QID) | ORAL | Status: DC
  Administered 2019-03-25 – 2019-03-27 (×9): 650 mg via ORAL
  Filled 2019-03-25 (×9): qty 2

## 2019-03-25 NOTE — Anesthesia Postprocedure Evaluation (Signed)
Anesthesia Post Note  Patient: Annette Cook  Procedure(s) Performed: ENDOSCOPIC RETROGRADE CHOLANGIOPANCREATOGRAPHY (ERCP) (N/A ) SPHINCTEROTOMY REMOVAL OF STONES BILIARY STENT PLACEMENT     Patient location during evaluation: PACU Anesthesia Type: General Level of consciousness: sedated Pain management: pain level controlled Vital Signs Assessment: post-procedure vital signs reviewed and stable Respiratory status: spontaneous breathing and respiratory function stable Cardiovascular status: stable Postop Assessment: no apparent nausea or vomiting Anesthetic complications: no                  Dolton Shaker DANIEL

## 2019-03-25 NOTE — Progress Notes (Signed)
[facility]/Trauma Progress Note  1 Day Post-Op   Assessment/Plan ADHD Hx generalized anxiety disorder/Hx panic attack Hx PTSD Hx AMS 9/2020questionable drug use Hx primary insomnia  S/p laparoscopic cholecystectomy 03/17/19 Dr. Rosendo Gros Postop abdominal pain/nausea and vomiting - CT, u/s, and MRCP essentially normal, HIDA positive for leak - S/P ERCP, Dr. Watt Climes, 12/10 - LFT's and Tbili up today - issues with GERD and continued abdominal pain - not tolerating PO well 2/2 pain and indigestion - pt needs to ambulate, maybe mild ileus 2/2 bile leak - recheck labs in am  ID - none FEN - IVF, CLD Foley - none Follow up - DOW/TBD   LOS: 1 day    Subjective: CC: abdominal pain and GERD  Pt states pain is still severe. She is unable to drink much 2/2 GERD. She has a lot of indigestion. She is unhappy with her nursing care. She denies vomiting or flatus. She has not walked in the hall. No BM. She would like to try a suppository.   Objective: Vital signs in last 24 hours: Temp:  [97.7 F (36.5 C)-98.6 F (37 C)] 98.4 F (36.9 C) (12/11 0535) Pulse Rate:  [88-120] 98 (12/11 0535) Resp:  [13-19] 18 (12/11 0535) BP: (122-151)/(82-97) 136/94 (12/11 0535) SpO2:  [94 %-99 %] 95 % (12/11 0535) Last BM Date: 03/22/19  Intake/Output from previous day: 12/10 0701 - 12/11 0700 In: 1141.3 [P.O.:118; I.V.:1018.2; IV Piggyback:5.1] Out: 202 [Urine:202] Intake/Output this shift: Total I/O In: 120 [P.O.:120] Out: -   PE:  Gen:  Alert, NAD, pleasant, cooperative Card:  RRR, no M/G/R heard Pulm:  CTA, no W/R/R, rate and effort normal Abd: Soft, mild distention, +BS, incisions with some glue remaining look well and are without signs of infection. Generalized TTP with mild guarding. No peritonitis.  Skin: no rashes noted, warm and dry Extremities: no TTP or swelling to calves BL   Anti-infectives: Anti-infectives (From admission, onward)   Start     Dose/Rate Route  Frequency Ordered Stop   03/24/19 1545  piperacillin-tazobactam (ZOSYN) IVPB 3.375 g     3.375 g 12.5 mL/hr over 240 Minutes Intravenous Every 8 hours 03/24/19 1539        Lab Results:  Recent Labs    03/24/19 0219 03/25/19 0303  WBC 8.5 14.6*  HGB 11.7* 11.4*  HCT 35.5* 35.5*  PLT 234 274   BMET Recent Labs    03/24/19 0219 03/25/19 0303  NA 138 140  K 3.6 3.8  CL 102 102  CO2 28 27  GLUCOSE 133* 129*  BUN <5* <5*  CREATININE 0.64 0.68  CALCIUM 8.8* 9.0   PT/INR No results for input(s): LABPROT, INR in the last 72 hours. CMP     Component Value Date/Time   NA 140 03/25/2019 0303   K 3.8 03/25/2019 0303   CL 102 03/25/2019 0303   CO2 27 03/25/2019 0303   GLUCOSE 129 (H) 03/25/2019 0303   BUN <5 (L) 03/25/2019 0303   CREATININE 0.68 03/25/2019 0303   CREATININE 0.77 03/17/2019 1113   CALCIUM 9.0 03/25/2019 0303   PROT 6.1 (L) 03/25/2019 0303   ALBUMIN 3.4 (L) 03/25/2019 0303   AST 101 (H) 03/25/2019 0303   ALT 107 (H) 03/25/2019 0303   ALKPHOS 159 (H) 03/25/2019 0303   BILITOT 1.7 (H) 03/25/2019 0303   GFRNONAA >60 03/25/2019 0303   GFRNONAA 100 03/17/2019 1113   GFRAA >60 03/25/2019 0303   GFRAA 116 03/17/2019 1113   Lipase  Component Value Date/Time   LIPASE 18 03/23/2019 0730    Studies/Results: CT ABDOMEN PELVIS W CONTRAST  Result Date: 03/23/2019 CLINICAL DATA:  Nausea vomiting, abdominal pain EXAM: CT ABDOMEN AND PELVIS WITH CONTRAST TECHNIQUE: Multidetector CT imaging of the abdomen and pelvis was performed using the standard protocol following bolus administration of intravenous contrast. CONTRAST:  157mL OMNIPAQUE IOHEXOL 300 MG/ML  SOLN COMPARISON:  Ultrasound March 17, 2019 FINDINGS: Lower chest: The visualized heart size within normal limits. No pericardial fluid/thickening. No hiatal hernia. The visualized portions of the lungs are clear. Hepatobiliary: The patient is status post cholecystectomy. Minimal amount of fat stranding changes  seen within the gallbladder bed. No loculated fluid collections are seen. There is a mildly dilated common bile duct measuring up to 10 mm. No definite calcified stones are seen within the common bile duct.The main portal vein is patent. Pancreas: Unremarkable. No pancreatic ductal dilatation or surrounding inflammatory changes. Spleen: Normal in size without focal abnormality. Adrenals/Urinary Tract: Both adrenal glands appear normal. The kidneys and collecting system appear normal without evidence of urinary tract calculus or hydronephrosis. Bladder is unremarkable. Stomach/Bowel: The stomach, small bowel, and colon are normal in appearance. No inflammatory changes, wall thickening, or obstructive findings.The appendix is normal. Vascular/Lymphatic: There are no enlarged mesenteric, retroperitoneal, or pelvic lymph nodes. No significant vascular findings are present. Reproductive: The uterus and adnexa are unremarkable. Other:there is a small amount of free fluid seen within the deep pelvis. There is air seen within the right anterior abdominal wall musculature as well as subcutaneous emphysema. There is also fat stranding changes seen at the umbilicus likely postsurgical changes. Musculoskeletal: No acute or significant osseous findings. IMPRESSION: 1. Status post cholecystectomy with amount of fat stranding changes in the gallbladder bed. No definite loculated fluid collections however are noted. 2. Mildly dilated common bile duct without definite evidence choledocholithiasis. However if further evaluation is required would recommend MRCP. 3. Subcutaneous emphysema over the right anterior abdominal with fat stranding at the umbilicus, likely postsurgical changes. Electronically Signed   By: Prudencio Pair M.D.   On: 03/23/2019 10:38   MR ABDOMEN MRCP WO CONTRAST  Result Date: 03/23/2019 CLINICAL DATA:  Dilated common bile duct. Prior cholecystectomy. Cholecystectomy several days prior (03/17/2019 EXAM: MRI  ABDOMEN WITHOUT CONTRAST  (INCLUDING MRCP) TECHNIQUE: Multiplanar multisequence MR imaging of the abdomen was performed. Heavily T2-weighted images of the biliary and pancreatic ducts were obtained, and three-dimensional MRCP images were rendered by post processing. COMPARISON:  CT 01/21/2019, ultrasound 03/17/2019 FINDINGS: Patient refused to complete entirety of exam due to complaints of pain. Pain medications were administered. Patient still could not completely exam. The MRCP sequences are degraded by patient motion. Lower chest:  Lung bases are clear. Hepatobiliary: No intrahepatic biliary duct dilatation. The common hepatic duct measures 8 mm. The common bile duct measures 8 mm. The common bile duct tapers smoothly to the ampulla. No filling defect is seen within the common bile duct. There is small amount of fluid within the gallbladder fossa. Fluid does not appear organized (image 10/7). A small amount fluid along the posterior margin RIGHT hepatic lobe additionally (image 28/5). This volume of fluid is felt to be within normal limits following cholecystectomy. There is a small hyperintense lesion LEFT lateral hepatic lobe measuring 1 cm (114/3) most consistent benign cysts or hemangioma. Pancreas: Normal pancreatic parenchymal intensity. No ductal dilatation or inflammation. Spleen: Normal spleen. Adrenals/urinary tract: Adrenal glands and kidneys are normal. Stomach/Bowel: Stomach and limited of the small bowel  is unremarkable Vascular/Lymphatic: Abdominal aortic normal caliber. No retroperitoneal periportal lymphadenopathy. Musculoskeletal: No aggressive osseous lesion. Subcutaneous gas in the RIGHT abdominal wall likely related to laparoscopic surgery. Gas better appreciated on comparison CT. IMPRESSION: 1. Incomplete exam. Patient reported pain which which prohibited complete exam. Pain medications were administered; however, the patient still could not complete exam. 2. With this caveat, the common  hepatic duct and common bile duct are mildly dilated. No filling defect within the bile duct identified. No intrahepatic duct dilatation present. 3. There is small amount of fluid within the gallbladder fossa and along the posterior margin of the RIGHT hepatic lobe which is felt to be within normal limits following cholecystectomy. No organized collections. 4. Normal pancreas without evidence of pancreatitis. 5. Subcutaneous gas in the RIGHT abdominal wall related to laparoscopic. Better appreciated on CT. Electronically Signed   By: Suzy Bouchard M.D.   On: 03/23/2019 13:57   DG ERCP BILIARY & PANCREATIC DUCTS  Result Date: 03/24/2019 CLINICAL DATA:  ERCP. EXAM: ERCP TECHNIQUE: Multiple spot images obtained with the fluoroscopic device and submitted for interpretation post-procedure. FLUOROSCOPY TIME:  Fluoroscopy Time:  2 minutes and 25 seconds Number of Acquired Spot Images: 3 COMPARISON:  March 24, 2019 FINDINGS: The patient has undergone ERCP. The first image demonstrates cannulation of the common bile duct with injection of contrast. Contrast opacifies the biliary tree. This is followed by a balloon sweep. Final images demonstrate placement of a plastic biliary stent. IMPRESSION: Status post ERCP as above. These images were submitted for radiologic interpretation only. Please see the procedural report for the amount of contrast and the fluoroscopy time utilized. Electronically Signed   By: Constance Holster M.D.   On: 03/24/2019 19:35   US PELVIC COMPLETE W TRANSVAGINAL AND TORSION R/O  Result Date: 03/23/2019 CLINICAL DATA:  Pelvic pain since this morning, RIGHT lower quadrant pain for 1 day, prior cholecystectomy EXAM: TRANSABDOMINAL AND TRANSVAGINAL ULTRASOUND OF PELVIS DOPPLER ULTRASOUND OF OVARIES TECHNIQUE: Both transabdominal and transvaginal ultrasound examinations of the pelvis were performed. Transabdominal technique was performed for global imaging of the pelvis including uterus,  ovaries, adnexal regions, and pelvic cul-de-sac. It was necessary to proceed with endovaginal exam following the transabdominal exam to visualize the endometrium and ovaries. Color and duplex Doppler ultrasound was utilized to evaluate blood flow to the ovaries. COMPARISON:  None FINDINGS: Uterus Measurements: 7.6 x 3.1 x 3.7 cm = volume: 44 mL. Anteverted. Normal morphology without mass Endometrium Thickness: 3 mm.  No endometrial fluid or focal abnormality Right ovary Measurements: 2.8 x 1.7 x 2.2 cm = volume: 5.6 mL. Normal morphology without mass. Blood flow present within RIGHT ovary on color Doppler imaging. Left ovary Measurements: 2.0 x 1.6 x 1.0 cm = volume: 1.7 mL. Normal morphology without mass. Blood flow present within LEFT ovary on color Doppler imaging. Pulsed Doppler evaluation of both ovaries demonstrates normal low-resistance arterial and venous waveforms. Other findings Trace free pelvic fluid.  No adnexal masses. IMPRESSION: Normal exam. Electronically Signed   By: Lavonia Dana M.D.   On: 03/23/2019 20:38   NM HEPATO BILIARY LEAK  Result Date: 03/24/2019 CLINICAL DATA:  Abdominal pain following cholecystectomy. Cholecystectomy 03/17/2019 EXAM: NUCLEAR MEDICINE HEPATOBILIARY IMAGING TECHNIQUE: Sequential images of the abdomen were obtained out to 60 minutes following intravenous administration of radiopharmaceutical. RADIOPHARMACEUTICALS:  4.8 mCi Tc-65m  Choletec IV COMPARISON:  MRI 03/23/2019, CT 03/23/2019 FINDINGS: Prompt clearance radiotracer from the blood pool and homogeneous uptake in liver. At 20 minutes counts begin to  collect over the bear space the liver prior to entering the small bowel. There is evidence of extraluminal radiotracer inferior to the RIGHT hepatic lobe by 30 minutes of imaging. Activity also appears in the LEFT lower quadrant which is felt to represent intraperitoneal free radiotracer rather than bowel activity. Minimal radiotracer enters the small bowel.  IMPRESSION: Active biliary leak which is relatively high volume. The majority of the excreted bile radiotracer accumulates in the peritoneal space rather passing into the small bowel. Initial activity collects over the bear space of the liver with subsequent spilling into the peritoneal space. In comparison to MRI of 03/23/2019 there is small amount of fluid along the pericolic gutters and RIGHT hepatic lobe which presumably represents bile. The exam was incomplete which did limit sensitivity. No organized collection of bile present. Findings conveyed toWILLARD JENNINGS on 03/24/2019  at14:45. Electronically Signed   By: Suzy Bouchard M.D.   On: 03/24/2019 14:58   US Abdomen Limited RUQ  Result Date: 03/23/2019 CLINICAL DATA:  Abdominal pain with nausea and vomiting. Cholecystectomy on 03/17/2019. EXAM: ULTRASOUND ABDOMEN LIMITED RIGHT UPPER QUADRANT COMPARISON:  CT scan of the abdomen dated 03/23/2019 FINDINGS: Gallbladder: Removed Common bile duct: Diameter: 6.1 mm. No evidence of common bile duct stone. No dilated intrahepatic bile ducts. Liver: No focal lesion identified. Within normal limits in parenchymal echogenicity. Portal vein is patent on color Doppler imaging with normal direction of blood flow towards the liver. Other: There is a tiny amount of free fluid adjacent to the posterior aspect of the right lobe of the liver, likely postsurgical. IMPRESSION: No evidence of common bile duct stone. Common bile duct on this ultrasound exam measures only 6.1 mm in diameter with no dilated intrahepatic bile ducts. This is within normal limits for a post cholecystectomy patient. Electronically Signed   By: Lorriane Shire M.D.   On: 03/23/2019 11:54     Kalman Drape, [facility] Surgery Please see amion for pager for the following: Cristine Polio, & Friday 7:00am - 4:30pm Thursdays 7:00am -11:30am

## 2019-03-25 NOTE — Progress Notes (Signed)
Pt was keep asking for pain med she even put in her phone for alarm for next dose, she didn't sleep last night, I informed the MD and discontinued the dilaudid and change it to Morphine and it works well.

## 2019-03-25 NOTE — Progress Notes (Addendum)
Annette Cook 9:13 AM  Subjective: Patient better overall and her pain is better but she does complain of reflux and constipation and some discomfort when she coughs which we discussed and she is taking stool softeners and MiraLAX and does use Protonix at home and does have some anxiety  Objective: Vital signs stable afebrile no acute distress abdomen is soft nontender white count up a little LFTs up a little  Assessment: Bile leak overall improved status post ERCP and stent placement  Plan: Twice daily pump inhibitors call us as needed otherwise follow-up with me as an outpatient in 1 month to set up stent removal if doing well in 6 weeks and consider adding liquid antacids for her reflux which might help her constipation as well whereas adding Carafate or Carafate slurry may tend to constipate her more and she is aware that increased dose of pain medicines will increase her constipation  Chazlyn Cude E  office [phone] After 5PM or if no answer call [phone]

## 2019-03-26 LAB — COMPREHENSIVE METABOLIC PANEL
ALT: 60 U/L — ABNORMAL HIGH (ref 0–44)
AST: 32 U/L (ref 15–41)
Albumin: 2.5 g/dL — ABNORMAL LOW (ref 3.5–5.0)
Alkaline Phosphatase: 126 U/L (ref 38–126)
Anion gap: 7 (ref 5–15)
BUN: <5 mg/dL — ABNORMAL LOW (ref 6–20)
CO2: 28 mmol/L (ref 22–32)
Calcium: 8.1 mg/dL — ABNORMAL LOW (ref 8.9–10.3)
Chloride: 104 mmol/L (ref 98–111)
Creatinine, Ser: 0.75 mg/dL (ref 0.44–1.00)
GFR calc Af Amer: >60 mL/min (ref >60)
GFR calc non Af Amer: >60 mL/min (ref >60)
Glucose, Bld: 101 mg/dL — ABNORMAL HIGH (ref 70–99)
Potassium: 3.6 mmol/L (ref 3.5–5.1)
Sodium: 139 mmol/L (ref 135–145)
Total Bilirubin: 1.1 mg/dL (ref 0.3–1.2)
Total Protein: 5 g/dL — ABNORMAL LOW (ref 6.5–8.1)

## 2019-03-26 LAB — LIPASE, BLOOD: Lipase: 319 U/L — ABNORMAL HIGH (ref 11–51)

## 2019-03-26 NOTE — Progress Notes (Signed)
[facility] Gastroenterology Progress Note  Annette Cook 33 y.o. 09/18/1985   Subjective: Groggy. Falling asleep while talking complaining of abdominal pain but feels pain is a little better after she has been up moving around. Denies nausea.  Objective: Vital signs: Vitals:   03/26/19 0529 03/26/19 0822  BP: 127/75 117/87  Pulse: (!) 121 (!) 115  Resp: 18 19  Temp: (!) 97.2 F (36.2 C) 98.7 F (37.1 C)  SpO2: 97% 97%    Physical Exam: Gen: somnolent, no acute distress, well-nourished HEENT: anicteric sclera CV: RRR Chest: CTA B Abd: diffuse tenderness with guarding, soft, nondistended, +BS Ext: no edema  Lab Results: Recent Labs    03/25/19 0303 03/26/19 0823  NA 140 139  K 3.8 3.6  CL 102 104  CO2 27 28  GLUCOSE 129* 101*  BUN <5* <5*  CREATININE 0.68 0.75  CALCIUM 9.0 8.1*   Recent Labs    03/25/19 0303 03/26/19 0823  AST 101* 32  ALT 107* 60*  ALKPHOS 159* 126  BILITOT 1.7* 1.1  PROT 6.1* 5.0*  ALBUMIN 3.4* 2.5*   Recent Labs    03/24/19 0219 03/25/19 0303  WBC 8.5 14.6*  HGB 11.7* 11.4*  HCT 35.5* 35.5*  MCV 92.4 94.2  PLT 234 274      Assessment/Plan: Bile leak - S/P ERCP (03/24/19) with sphincterotomy and biliary stent placement. LFTs normalized. Lipase 318. Pain control per primary team. Clear liquid diet. Supportive care. Will follow.    Annette Cook 03/26/2019, 10:35 AM  Questions please call [phone] ID: Annette Cook, female   DOB: 1986-03-25, 33 y.o.   MRN: [id number]

## 2019-03-26 NOTE — Progress Notes (Signed)
Patient ID: Annette Cook, female   DOB: Apr 28, 1985, 33 y.o.   MRN: [id number] [facility] Surgery Progress Note:   2 Days Post-Op  Subjective: Mental status is clear;  Patient wanting pain meds.   Objective: Vital signs in last 24 hours: Temp:  [97.2 F (36.2 C)-98.9 F (37.2 C)] 97.2 F (36.2 C) (12/12 0529) Pulse Rate:  [101-125] 121 (12/12 0529) Resp:  [18] 18 (12/12 0529) BP: (115-142)/(61-94) 127/75 (12/12 0529) SpO2:  [97 %-100 %] 97 % (12/12 0529)  Intake/Output from previous day: 12/11 0701 - 12/12 0700 In: 1200 [P.O.:1200] Out: -  Intake/Output this shift: No intake/output data recorded.  Physical Exam: Work of breathing is shallow-pain with inspiration  Lab Results:  Results for orders placed or performed during the hospital encounter of 03/23/19 (from the past 48 hour(s))  Urine rapid drug screen (hosp performed)     Status: Abnormal   Collection Time: 03/24/19 10:31 AM  Result Value Ref Range   Opiates POSITIVE (A) NONE DETECTED   Cocaine NONE DETECTED NONE DETECTED   Benzodiazepines POSITIVE (A) NONE DETECTED   Amphetamines NONE DETECTED NONE DETECTED   Tetrahydrocannabinol NONE DETECTED NONE DETECTED   Barbiturates NONE DETECTED NONE DETECTED    Comment: (NOTE) DRUG SCREEN FOR MEDICAL PURPOSES ONLY.  IF CONFIRMATION IS NEEDED FOR ANY PURPOSE, NOTIFY LAB WITHIN 5 DAYS. LOWEST DETECTABLE LIMITS FOR URINE DRUG SCREEN Drug Class                     Cutoff (ng/mL) Amphetamine and metabolites    1000 Barbiturate and metabolites    200 Benzodiazepine                 [id number] Tricyclics and metabolites     300 Opiates and metabolites        300 Cocaine and metabolites        300 THC                            50 Performed at [facility], [place] [address] [place], [place] [address]   CBC     Status: Abnormal   Collection Time: 03/25/19  3:03 AM  Result Value Ref Range   WBC 14.6 (H) 4.0 - 10.5 K/uL   RBC 3.77 (L) 3.87 - 5.11 MIL/uL   Hemoglobin  11.4 (L) 12.0 - 15.0 g/dL   HCT 35.5 (L) 36.0 - 46.0 %   MCV 94.2 80.0 - 100.0 fL   MCH 30.2 26.0 - 34.0 pg   MCHC 32.1 30.0 - 36.0 g/dL   RDW 12.4 11.5 - 15.5 %   Platelets 274 150 - 400 K/uL   nRBC 0.0 0.0 - 0.2 %    Comment: Performed at [facility], [place] [address], [place], [place] [address]  Comprehensive metabolic panel     Status: Abnormal   Collection Time: 03/25/19  3:03 AM  Result Value Ref Range   Sodium 140 135 - 145 mmol/L   Potassium 3.8 3.5 - 5.1 mmol/L   Chloride 102 98 - 111 mmol/L   CO2 27 22 - 32 mmol/L   Glucose, Bld 129 (H) 70 - 99 mg/dL   BUN <5 (L) 6 - 20 mg/dL   Creatinine, Ser 0.68 0.44 - 1.00 mg/dL   Calcium 9.0 8.9 - 10.3 mg/dL   Total Protein 6.1 (L) 6.5 - 8.1 g/dL   Albumin 3.4 (L) 3.5 -  5.0 g/dL   AST 101 (H) 15 - 41 U/L   ALT 107 (H) 0 - 44 U/L   Alkaline Phosphatase 159 (H) 38 - 126 U/L   Total Bilirubin 1.7 (H) 0.3 - 1.2 mg/dL   GFR calc non Af Amer >60 >60 mL/min   GFR calc Af Amer >60 >60 mL/min   Anion gap 11 5 - 15    Comment: Performed at [place] [address], [place], [place] [address]    Radiology/Results: DG ERCP BILIARY & PANCREATIC DUCTS  Result Date: 03/24/2019 CLINICAL DATA:  ERCP. EXAM: ERCP TECHNIQUE: Multiple spot images obtained with the fluoroscopic device and submitted for interpretation post-procedure. FLUOROSCOPY TIME:  Fluoroscopy Time:  2 minutes and 25 seconds Number of Acquired Spot Images: 3 COMPARISON:  March 24, 2019 FINDINGS: The patient has undergone ERCP. The first image demonstrates cannulation of the common bile duct with injection of contrast. Contrast opacifies the biliary tree. This is followed by a balloon sweep. Final images demonstrate placement of a plastic biliary stent. IMPRESSION: Status post ERCP as above. These images were submitted for radiologic interpretation only. Please see the procedural report for the amount of contrast and the fluoroscopy time utilized. Electronically  Signed   By: Constance Holster M.D.   On: 03/24/2019 19:35   NM HEPATO BILIARY LEAK  Result Date: 03/24/2019 CLINICAL DATA:  Abdominal pain following cholecystectomy. Cholecystectomy 03/17/2019 EXAM: NUCLEAR MEDICINE HEPATOBILIARY IMAGING TECHNIQUE: Sequential images of the abdomen were obtained out to 60 minutes following intravenous administration of radiopharmaceutical. RADIOPHARMACEUTICALS:  4.8 mCi Tc-73m  Choletec IV COMPARISON:  MRI 03/23/2019, CT 03/23/2019 FINDINGS: Prompt clearance radiotracer from the blood pool and homogeneous uptake in liver. At 20 minutes counts begin to collect over the bear space the liver prior to entering the small bowel. There is evidence of extraluminal radiotracer inferior to the RIGHT hepatic lobe by 30 minutes of imaging. Activity also appears in the LEFT lower quadrant which is felt to represent intraperitoneal free radiotracer rather than bowel activity. Minimal radiotracer enters the small bowel. IMPRESSION: Active biliary leak which is relatively high volume. The majority of the excreted bile radiotracer accumulates in the peritoneal space rather passing into the small bowel. Initial activity collects over the bear space of the liver with subsequent spilling into the peritoneal space. In comparison to MRI of 03/23/2019 there is small amount of fluid along the pericolic gutters and RIGHT hepatic lobe which presumably represents bile. The exam was incomplete which did limit sensitivity. No organized collection of bile present. Findings conveyed toWILLARD JENNINGS on 03/24/2019  at14:45. Electronically Signed   By: Suzy Bouchard M.D.   On: 03/24/2019 14:58    Anti-infectives: Anti-infectives (From admission, onward)   Start     Dose/Rate Route Frequency Ordered Stop   03/24/19 1545  piperacillin-tazobactam (ZOSYN) IVPB 3.375 g     3.375 g 12.5 mL/hr over 240 Minutes Intravenous Every 8 hours 03/24/19 1539        Assessment/Plan: Problem List: Patient  Active Problem List   Diagnosis Date Noted  . Status post laparoscopic cholecystectomy 03/23/2019  . Right cervical radiculopathy 02/28/2019  . Drug withdrawal seizure with delirium ([place]) 02/28/2019  . Elevated blood pressure reading 02/28/2019  . Gastroesophageal reflux disease 02/28/2019  . Moderate episode of recurrent major depressive disorder ([place]) 02/28/2019  . PTSD (post-traumatic stress disorder) 11/12/2018  . Anxiety 11/12/2018  . Neck pain 11/12/2018  . Acute bilateral low back pain without sciatica 11/12/2018  .  Overactive bladder 01/21/2018  . Adult ADHD 04/10/2017  . Opioid use agreement exists 10/24/2016  . Encounter for chronic pain management 10/24/2016  . Takes daily multivitamins 09/12/2016  . 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 Anxiety and depression 09/03/2015  . 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 Acute stress reaction 07/08/2013  . Generalized anxiety disorder 10/22/2012  . Insomnia 10/22/2012  . Panic attack 10/22/2012  . ADHD (attention deficit hyperactivity disorder) 10/22/2012  . HSV infection 12/02/2011    No labs drawn today.  Will check CMET and Lipase.  Not ready for discharge due to pain issues.   2 Days Post-Op    LOS: 2 days   Matt B. Hassell Done, MD, [facility] [phone] beeper [phone]  03/26/2019 8:04 AM

## 2019-03-27 LAB — COMPREHENSIVE METABOLIC PANEL
ALT: 48 U/L — ABNORMAL HIGH (ref 0–44)
AST: 21 U/L (ref 15–41)
Albumin: 2.5 g/dL — ABNORMAL LOW (ref 3.5–5.0)
Alkaline Phosphatase: 135 U/L — ABNORMAL HIGH (ref 38–126)
Anion gap: 10 (ref 5–15)
BUN: <5 mg/dL — ABNORMAL LOW (ref 6–20)
CO2: 26 mmol/L (ref 22–32)
Calcium: 8.2 mg/dL — ABNORMAL LOW (ref 8.9–10.3)
Chloride: 104 mmol/L (ref 98–111)
Creatinine, Ser: 0.51 mg/dL (ref 0.44–1.00)
GFR calc Af Amer: >60 mL/min (ref >60)
GFR calc non Af Amer: >60 mL/min (ref >60)
Glucose, Bld: 103 mg/dL — ABNORMAL HIGH (ref 70–99)
Potassium: 3.3 mmol/L — ABNORMAL LOW (ref 3.5–5.1)
Sodium: 140 mmol/L (ref 135–145)
Total Bilirubin: 0.9 mg/dL (ref 0.3–1.2)
Total Protein: 4.7 g/dL — ABNORMAL LOW (ref 6.5–8.1)

## 2019-03-27 LAB — LIPASE, BLOOD: Lipase: 61 U/L — ABNORMAL HIGH (ref 11–51)

## 2019-03-27 MED ORDER — OXYCODONE HCL 5 MG PO TABS: 5.0000 mg | ORAL_TABLET | Freq: Four times a day (QID) | ORAL | 0 refills | Status: DC | PRN

## 2019-03-27 NOTE — Progress Notes (Signed)
Pt discharged home accompanied by her husband given pain med prior to discharge, no s/s of distress noted.

## 2019-03-27 NOTE — Plan of Care (Signed)
Pt for discharge going home, alert and oriented, discontinued peripheral IV line, given all her personal belongings, able to ambulate, tolerates meal, no s/s of nausea/vomiting, wound site with skin glued dry and intact, will give pain med prior to discharge, given health teachings, next appointment, due med explained and understood, waiting for her husband to pick her up

## 2019-03-27 NOTE — Discharge Instructions (Signed)
Endoscopic Retrograde Cholangiopancreatogram, Care After  This sheet gives you information about how to care for yourself after your procedure. Your health care provider may also give you more specific instructions. If you have problems or questions, contact your health care provider.  What can I expect after the procedure?  After the procedure, it is common to have:  · Soreness in your throat.  · Nausea.  · Bloating.  · Dizziness.  · Tiredness (fatigue).  Follow these instructions at home:    · Take over-the-counter and prescription medicines only as told by your health care provider.  · Do not drive for 24 hours if you were given a medicine to help you relax (sedative) during your procedure. Have someone stay with you for 24 hours after the procedure.  · Return to your normal activities as told by your health care provider. Ask your health care provider what activities are safe for you.  · Return to eating what you normally do as soon as you feel well enough or as told by your health care provider.  · Keep all follow-up visits as told by your health care provider. This is important.  Contact a health care provider if:  · You have pain in your abdomen that does not get better with medicine.  · You develop signs of infection, such as:  ? Chills.  ? Feeling unwell.  Get help right away if:  · You have difficulty swallowing.  · You have worsening pain in your throat, chest, or abdomen.  · You vomit bright red blood or a substance that looks like coffee grounds.  · You have bloody or very black stools.  · You have a fever.  · You have a sudden increase in swelling (bloating) in your abdomen.  Summary  · After the procedure, it is common to feel tired and to have some discomfort in your throat.  · Contact your health care provider if you have signs of infection--such as chills or feeling unwell--or if you have pain that does not improve with medicine.  · Get help right away if you have trouble swallowing, worsening  pain, bloody or black vomit, bloody or black stools, a fever, or increased swelling in your abdomen.  · Keep all follow-up visits as told by your health care provider. This is important.  This information is not intended to replace advice given to you by your health care provider. Make sure you discuss any questions you have with your health care provider.  Document Released: 01/19/2013 Document Revised: 03/13/2017 Document Reviewed: 02/18/2016  Elsevier Patient Education © 2020 [employer]

## 2019-03-27 NOTE — Progress Notes (Signed)
Per the nurse tech, pt has a vape in her room. Educated pt with the hospital's policy regarding smoking in the facility. Pt needs further learning.

## 2019-03-27 NOTE — Discharge Summary (Signed)
Physician Discharge Summary  Patient ID: Annette Cook MRN: [id number] DOB/AGE: 1985-07-04 33 y.o.  PCP: Donella Stade, PA-C  Admit date: 03/23/2019 Discharge date: 03/27/2019  Admission Diagnoses:  Bile leak post lap chole  Discharge Diagnoses:  Same post ERCP and stent placement Active Problems:   Status post laparoscopic cholecystectomy   Surgery:  ERCP and stent placement  Discharged Condition: stable  Hospital Course:   Patient had lap chole per Dr. Rosendo Gros 12/3 and was discharged.  Returned with bile leak and required ERCP and stent placement.  Pain control chanllenges but ready for discharge on Sunday, Dec 13th.  Followup with Dr. Watt Climes for stent removal and Dr. Rosendo Gros  Consults: Magod  Significant Diagnostic Studies: ERCP    Discharge Exam: Blood pressure 139/90, pulse (!) 121, temperature 98.3 F (36.8 C), temperature source Oral, resp. rate 19, height 5' (1.524 m), weight 57.6 kg, last menstrual period 03/16/2019, SpO2 95 %, currently breastfeeding. Incisions OK  Disposition: Discharge disposition: 01-Home or Self Care       Discharge Instructions    Call MD for:  redness, tenderness, or signs of infection (pain, swelling, redness, odor or green/yellow discharge around incision site)   Complete by: As directed    Diet - low sodium heart healthy   Complete by: As directed    Would stay on liquid diet for 2 days before advancing to solids.   Increase activity slowly   Complete by: As directed      Allergies as of 03/27/2019   No Known Allergies     Medication List    STOP taking these medications   oxyCODONE-acetaminophen 5-325 MG tablet Commonly known as: PERCOCET/ROXICET   traMADol 50 MG tablet Commonly known as: Ultram     TAKE these medications   ALPRAZolam 0.5 MG tablet Commonly known as: XANAX Take 0.5 mg by mouth daily as needed for anxiety.   DULoxetine 30 MG capsule Commonly known as: CYMBALTA Take 30 mg by mouth 2 (two)  times daily.   famotidine 20 MG tablet Commonly known as: Pepcid Take 1 tablet (20 mg total) by mouth 2 (two) times daily.   hydrOXYzine 50 MG tablet Commonly known as: ATARAX/VISTARIL Take 1 tablet (50 mg total) by mouth every 8 (eight) hours as needed for anxiety. What changed:   how much to take  when to take this  additional instructions   Melatonin 5 MG Caps Take 5 mg by mouth at bedtime.   MULTIVITAMIN ADULT PO Take 1 tablet by mouth at bedtime.   Oxcarbazepine 300 MG tablet Commonly known as: TRILEPTAL Take 300 mg by mouth 2 (two) times daily.   oxyCODONE 5 MG immediate release tablet Commonly known as: Oxy IR/ROXICODONE Take 1 tablet (5 mg total) by mouth every 6 (six) hours as needed for severe pain.   pantoprazole 40 MG tablet Commonly known as: PROTONIX Take 40 mg by mouth daily.   tiZANidine 2 MG tablet Commonly known as: ZANAFLEX Take 2 mg by mouth at bedtime.   valACYclovir 500 MG tablet Commonly known as: VALTREX TAKE 1 TABLET BY MOUTH EVERYDAY AT BEDTIME What changed:   how much to take  how to take this  when to take this  additional instructions      Follow-up Information    Ralene Ok, MD. Schedule an appointment as soon as possible for a visit in 4 week(s).   Specialty: General Surgery Contact information: [address] [place] [address] [phone]  Clarene Essex, MD. Schedule an appointment as soon as possible for a visit in 3 week(s).   Specialty: Gastroenterology Contact information: [id number] [place] [address] [phone]           Signed: Pedro Earls 03/27/2019, 8:46 AM

## 2019-04-01 ENCOUNTER — Ambulatory Visit: Payer: Managed Care, Other (non HMO) | Admitting: Family Medicine

## 2019-04-03 ENCOUNTER — Other Ambulatory Visit: Payer: Self-pay | Admitting: Physician Assistant

## 2019-04-03 DIAGNOSIS — F411 Generalized anxiety disorder: Secondary | ICD-10-CM

## 2019-04-04 ENCOUNTER — Other Ambulatory Visit: Payer: Self-pay | Admitting: Physician Assistant

## 2019-04-04 DIAGNOSIS — F411 Generalized anxiety disorder: Secondary | ICD-10-CM

## 2019-04-04 MED ORDER — HYDROXYZINE HCL 50 MG PO TABS: 50.0000 mg | ORAL_TABLET | Freq: Two times a day (BID) | ORAL | 1 refills | Status: DC

## 2019-04-20 ENCOUNTER — Other Ambulatory Visit: Payer: Self-pay | Admitting: Gastroenterology

## 2019-04-26 ENCOUNTER — Other Ambulatory Visit: Payer: Self-pay | Admitting: Physician Assistant

## 2019-04-26 DIAGNOSIS — F411 Generalized anxiety disorder: Secondary | ICD-10-CM

## 2019-05-13 ENCOUNTER — Other Ambulatory Visit (HOSPITAL_COMMUNITY)
Admission: RE | Admit: 2019-05-13 | Discharge: 2019-05-13 | Disposition: A | Discharge status: Home / Self Care | Payer: Managed Care, Other (non HMO) | Source: Ambulatory Visit | Attending: Gastroenterology | Admitting: Gastroenterology

## 2019-05-13 DIAGNOSIS — Z20822 Contact with and (suspected) exposure to covid-19: Secondary | ICD-10-CM | POA: Diagnosis not present

## 2019-05-13 DIAGNOSIS — Z01812 Encounter for preprocedural laboratory examination: Secondary | ICD-10-CM | POA: Insufficient documentation

## 2019-05-13 LAB — SARS CORONAVIRUS 2 (TAT 6-24 HRS): SARS Coronavirus 2: NEGATIVE

## 2019-05-16 ENCOUNTER — Encounter (HOSPITAL_COMMUNITY): Payer: Self-pay | Admitting: Gastroenterology

## 2019-05-16 NOTE — Progress Notes (Signed)
preop completed, answered questions, arrival time 1120

## 2019-05-16 NOTE — Progress Notes (Signed)
Pre Endo call complete for procedure 05/17/19. Pt confirms arrival 1120, need for responsible party present 24 hours following procedure end, and npo status. Pt states last seizure activity was Oct 2020 when she experienced memory loss and could not recall 2 weeks of time. Last menstrual cycle 1 month ago, states husband had vasectomy but "did not go back to get it tested."

## 2019-05-17 ENCOUNTER — Encounter (HOSPITAL_COMMUNITY)
Admission: RE | Disposition: A | Discharge status: Home / Self Care | Payer: Self-pay | Source: Home / Self Care | Attending: Gastroenterology

## 2019-05-17 ENCOUNTER — Other Ambulatory Visit: Payer: Self-pay

## 2019-05-17 ENCOUNTER — Encounter (HOSPITAL_COMMUNITY): Payer: Self-pay | Admitting: Gastroenterology

## 2019-05-17 ENCOUNTER — Ambulatory Visit (HOSPITAL_COMMUNITY): Payer: Managed Care, Other (non HMO) | Admitting: Certified Registered"

## 2019-05-17 ENCOUNTER — Ambulatory Visit (HOSPITAL_COMMUNITY)
Admission: RE | Admit: 2019-05-17 | Discharge: 2019-05-17 | Disposition: A | Discharge status: Home / Self Care | Payer: Managed Care, Other (non HMO) | Attending: Gastroenterology | Admitting: Gastroenterology

## 2019-05-17 ENCOUNTER — Ambulatory Visit (HOSPITAL_COMMUNITY): Payer: Managed Care, Other (non HMO)

## 2019-05-17 DIAGNOSIS — F331 Major depressive disorder, recurrent, moderate: Secondary | ICD-10-CM | POA: Diagnosis not present

## 2019-05-17 DIAGNOSIS — Z79899 Other long term (current) drug therapy: Secondary | ICD-10-CM | POA: Diagnosis not present

## 2019-05-17 DIAGNOSIS — Z87891 Personal history of nicotine dependence: Secondary | ICD-10-CM | POA: Insufficient documentation

## 2019-05-17 DIAGNOSIS — R569 Unspecified convulsions: Secondary | ICD-10-CM | POA: Diagnosis not present

## 2019-05-17 DIAGNOSIS — K219 Gastro-esophageal reflux disease without esophagitis: Secondary | ICD-10-CM | POA: Diagnosis not present

## 2019-05-17 DIAGNOSIS — F411 Generalized anxiety disorder: Secondary | ICD-10-CM | POA: Diagnosis not present

## 2019-05-17 DIAGNOSIS — R1011 Right upper quadrant pain: Secondary | ICD-10-CM | POA: Insufficient documentation

## 2019-05-17 DIAGNOSIS — Z4689 Encounter for fitting and adjustment of other specified devices: Secondary | ICD-10-CM

## 2019-05-17 HISTORY — PX: ERCP: SHX5425

## 2019-05-17 HISTORY — DX: Headache, unspecified: R51.9

## 2019-05-17 HISTORY — PX: STENT REMOVAL: SHX6421

## 2019-05-17 LAB — PREGNANCY, URINE: Preg Test, Ur: NEGATIVE

## 2019-05-17 SURGERY — ERCP, WITH INTERVENTION IF INDICATED
Anesthesia: Monitor Anesthesia Care

## 2019-05-17 MED ORDER — PROPOFOL 10 MG/ML IV BOLUS
INTRAVENOUS | Status: AC
  Filled 2019-05-17: qty 20

## 2019-05-17 MED ORDER — PROPOFOL 10 MG/ML IV BOLUS
INTRAVENOUS | Status: DC | PRN
  Administered 2019-05-17: 50 mg via INTRAVENOUS
  Administered 2019-05-17 (×5): 20 mg via INTRAVENOUS

## 2019-05-17 MED ORDER — GLUCAGON HCL RDNA (DIAGNOSTIC) 1 MG IJ SOLR
INTRAMUSCULAR | Status: AC
  Filled 2019-05-17: qty 1

## 2019-05-17 MED ORDER — INDOMETHACIN 50 MG RE SUPP
RECTAL | Status: AC
  Filled 2019-05-17: qty 2

## 2019-05-17 MED ORDER — SODIUM CHLORIDE 0.9 % IV SOLN: INTRAVENOUS | Status: DC

## 2019-05-17 MED ORDER — PROPOFOL 500 MG/50ML IV EMUL
INTRAVENOUS | Status: AC
  Filled 2019-05-17: qty 50

## 2019-05-17 MED ORDER — PROPOFOL 500 MG/50ML IV EMUL
INTRAVENOUS | Status: DC | PRN
  Administered 2019-05-17: 175 ug/kg/min via INTRAVENOUS
  Administered 2019-05-17: 150 ug/kg/min via INTRAVENOUS

## 2019-05-17 NOTE — Progress Notes (Signed)
Annette Cook 12:42 PM  Subjective: Patient doing well without any new problems since we saw her on telehealth recently and again this procedure was discussed  Objective: Vital signs stable afebrile exam please see preassessment evaluation  Assessment: Bile leak status post stenting  Plan: Okay to proceed with endoscopy and stent removal with anesthesia assistance  [facility]  office [phone] After 5PM or if no answer call [phone]

## 2019-05-17 NOTE — Transfer of Care (Signed)
Immediate Anesthesia Transfer of Care Note  Patient: Annette Cook  Procedure(s) Performed: ENDOSCOPIC RETROGRADE CHOLANGIOPANCREATOGRAPHY (ERCP) (N/A ) STENT REMOVAL  Patient Location: PACU  Anesthesia Type:General  Level of Consciousness: awake, alert  and oriented  Airway & Oxygen Therapy: Patient Spontanous Breathing and Patient connected to face mask oxygen  Post-op Assessment: Report given to RN, Post -op Vital signs reviewed and stable and Patient moving all extremities X 4  Post vital signs: Reviewed and stable  Last Vitals:  Vitals Value Taken Time  BP    Temp    Pulse 92 05/17/19 1314  Resp 19 05/17/19 1314  SpO2 100 % 05/17/19 1314  Vitals shown include unvalidated device data.  Last Pain:  Vitals:   05/17/19 1213  TempSrc: Oral  PainSc: 0-No pain         Complications: No apparent anesthesia complications

## 2019-05-17 NOTE — Anesthesia Preprocedure Evaluation (Signed)
Anesthesia Evaluation  Patient identified by MRN, date of birth, ID band Patient awake    Reviewed: Allergy & Precautions, NPO status , Patient's Chart, lab work & pertinent test results  History of Anesthesia Complications Negative for: history of anesthetic complications  Airway Mallampati: II  TM Distance: >3 FB Neck ROM: Full    Dental   Pulmonary neg pulmonary ROS, former smoker,    Pulmonary exam normal        Cardiovascular negative cardio ROS Normal cardiovascular exam     Neuro/Psych negative neurological ROS  negative psych ROS   GI/Hepatic Neg liver ROS, GERD  ,  Endo/Other  negative endocrine ROS  Renal/GU negative Renal ROS  negative genitourinary   Musculoskeletal negative musculoskeletal ROS (+)   Abdominal   Peds  Hematology negative hematology ROS (+)   Anesthesia Other Findings   Reproductive/Obstetrics                             Anesthesia Physical Anesthesia Plan  ASA: II  Anesthesia Plan: MAC   Post-op Pain Management:    Induction: Intravenous  PONV Risk Score and Plan: 2 and Propofol infusion, TIVA and Treatment may vary due to age or medical condition  Airway Management Planned: Natural Airway, Nasal Cannula and Simple Face Mask  Additional Equipment: None  Intra-op Plan:   Post-operative Plan:   Informed Consent: I have reviewed the patients History and Physical, chart, labs and discussed the procedure including the risks, benefits and alternatives for the proposed anesthesia with the patient or authorized representative who has indicated his/her understanding and acceptance.       Plan Discussed with:   Anesthesia Plan Comments:         Anesthesia Quick Evaluation

## 2019-05-17 NOTE — Discharge Instructions (Signed)
Soft solids first meal today and slowly advance as tolerated and do not drive today but should be able to return to normal activities tomorrow and please call if any GI question or problem in the future and happy to see back as neededYOU HAD AN ENDOSCOPIC PROCEDURE TODAY: Refer to the procedure report and other information in the discharge instructions given to you for any specific questions about what was found during the examination. If this information does not answer your questions, please call [facility] office at [phone] to clarify.   YOU SHOULD EXPECT: Some feelings of bloating in the abdomen. Passage of more gas than usual. Walking can help get rid of the air that was put into your GI tract during the procedure and reduce the bloating. If you had a lower endoscopy (such as a colonoscopy or flexible sigmoidoscopy) you may notice spotting of blood in your stool or on the toilet paper. Some abdominal soreness may be present for a day or two, also.  DIET: Your first meal following the procedure should be a light meal and then it is ok to progress to your normal diet. A half-sandwich or bowl of soup is an example of a good first meal. Heavy or fried foods are harder to digest and may make you feel nauseous or bloated. Drink plenty of fluids but you should avoid alcoholic beverages for 24 hours. If you had a esophageal dilation, please see attached instructions for diet.    ACTIVITY: Your care partner should take you home directly after the procedure. You should plan to take it easy, moving slowly for the rest of the day. You can resume normal activity the day after the procedure however YOU SHOULD NOT DRIVE, use power tools, machinery or perform tasks that involve climbing or major physical exertion for 24 hours (because of the sedation medicines used during the test).   SYMPTOMS TO REPORT IMMEDIATELY: A gastroenterologist can be reached at any hour. Please call [phone]  for any of the following  symptoms:  . Following lower endoscopy (colonoscopy, flexible sigmoidoscopy) Excessive amounts of blood in the stool  Significant tenderness, worsening of abdominal pains  Swelling of the abdomen that is new, acute  Fever of 100 or higher  . Following upper endoscopy (EGD, EUS, ERCP, esophageal dilation) Vomiting of blood or coffee ground material  New, significant abdominal pain  New, significant chest pain or pain under the shoulder blades  Painful or persistently difficult swallowing  New shortness of breath  Black, tarry-looking or red, bloody stools  FOLLOW UP:  If any biopsies were taken you will be contacted by phone or by letter within the next 1-3 weeks. Call [phone]  if you have not heard about the biopsies in 3 weeks.  Please also call with any specific questions about appointments or follow up tests.

## 2019-05-17 NOTE — Op Note (Signed)
[facility] Patient Name: Annette Cook Procedure Date: 05/17/2019 MRN: [id number] Attending MD: Clarene Essex , MD Date of Birth: 1985-08-30 CSN: [id number] Age: 34 Admit Type: Outpatient Procedure:                ERCP Indications:              Biliary stent removal history of bile leak status                            post ERCP with stenting currently doing well Providers:                Clarene Essex, MD, Jeanella Cara, RN, Cherylynn Ridges, Technician, Corie Chiquito, Technician,                            Maudry Diego, CRNA Referring MD:              Medicines:                Propofol total dose [id number] mg IV Complications:            No immediate complications. Estimated Blood Loss:     Estimated blood loss: none. Procedure:                Pre-Anesthesia Assessment:                           - Prior to the procedure, a History and Physical                            was performed, and patient medications and                            allergies were reviewed. The patient's tolerance of                            previous anesthesia was also reviewed. The risks                            and benefits of the procedure and the sedation                            options and risks were discussed with the patient.                            All questions were answered, and informed consent                            was obtained. Prior Anticoagulants: The patient has                            taken no previous anticoagulant or antiplatelet  agents. ASA Grade Assessment: II - A patient with                            mild systemic disease. After reviewing the risks                            and benefits, the patient was deemed in                            satisfactory condition to undergo the procedure.                           After obtaining informed consent, the scope was                            passed under  direct vision. Throughout the                            procedure, the patient's blood pressure, pulse, and                            oxygen saturations were monitored continuously. The                            [id number] Olympus gastroscope was                            introduced through the mouth, and used to inject                            contrast into and used to locate the major papilla.                            The endoscopy was accomplished without difficulty.                            The patient tolerated the procedure well. Scope In: Scope Out: Findings:      A standard esophagogastroduodenoscopy scope was used for the examination       of the upper gastrointestinal tract. The scope was passed under direct       vision through the upper GI tract. The upper GI tract was normal on       quick exam to the duodenum. One stent was removed from the common bile       duct using a snare. Impression:               - Normal upper GI tract on quick evaluation.                           - One stent was removed from the common bile duct. Moderate Sedation:      Not Applicable - Patient had care per Anesthesia. Recommendation:           - Soft diet today.                           -  Continue present medications.                           - Return to GI clinic PRN.                           - Telephone GI clinic if symptomatic PRN. Procedure Code(s):        --- Professional ---                           [phone], Esophagogastroduodenoscopy, flexible,                            transoral; with removal of foreign body(s) Diagnosis Code(s):        --- Professional ---                           Z46.59, Encounter for fitting and adjustment of                            other gastrointestinal appliance and device CPT copyright 2019 [employer]. All rights reserved. The codes documented in this report are preliminary and upon coder review may  be revised to meet  current compliance requirements. Clarene Essex, MD 05/17/2019 1:11:47 PM This report has been signed electronically. Number of Addenda: 0

## 2019-05-17 NOTE — Anesthesia Procedure Notes (Signed)
Procedure Name: MAC Date/Time: 05/17/2019 12:51 PM Performed by: Niel Hummer, CRNA Pre-anesthesia Checklist: Patient identified, Emergency Drugs available, Suction available and Patient being monitored Oxygen Delivery Method: Simple face mask

## 2019-05-17 NOTE — Anesthesia Postprocedure Evaluation (Signed)
Anesthesia Post Note  Patient: Annette Cook  Procedure(s) Performed: ENDOSCOPIC RETROGRADE CHOLANGIOPANCREATOGRAPHY (ERCP) (N/A ) STENT REMOVAL     Patient location during evaluation: Endoscopy Anesthesia Type: MAC Level of consciousness: awake and alert Pain management: pain level controlled Vital Signs Assessment: post-procedure vital signs reviewed and stable Respiratory status: spontaneous breathing, nonlabored ventilation and respiratory function stable Cardiovascular status: blood pressure returned to baseline and stable Postop Assessment: no apparent nausea or vomiting Anesthetic complications: no    Last Vitals:  Vitals:   05/17/19 1316 05/17/19 1326  BP: 127/65 118/75  Pulse: 86 84  Resp: 20 19  Temp: 36.4 C   SpO2: 98% 100%    Last Pain:  Vitals:   05/17/19 1326  TempSrc:   PainSc: 0-No pain                 Lidia Collum

## 2019-05-18 ENCOUNTER — Encounter: Payer: Self-pay | Admitting: *Deleted

## 2019-05-29 ENCOUNTER — Other Ambulatory Visit: Payer: Self-pay | Admitting: Physician Assistant

## 2019-05-29 DIAGNOSIS — F411 Generalized anxiety disorder: Secondary | ICD-10-CM

## 2019-06-06 ENCOUNTER — Encounter: Payer: Self-pay | Admitting: Physician Assistant

## 2019-06-06 ENCOUNTER — Ambulatory Visit (INDEPENDENT_AMBULATORY_CARE_PROVIDER_SITE_OTHER): Payer: Managed Care, Other (non HMO) | Admitting: Physician Assistant

## 2019-06-06 VITALS — BP 122/68 | HR 74 | Ht 60.0 in | Wt 124.0 lb

## 2019-06-06 DIAGNOSIS — G2581 Restless legs syndrome: Secondary | ICD-10-CM | POA: Diagnosis not present

## 2019-06-06 DIAGNOSIS — Z13220 Encounter for screening for lipoid disorders: Secondary | ICD-10-CM

## 2019-06-06 DIAGNOSIS — Z131 Encounter for screening for diabetes mellitus: Secondary | ICD-10-CM

## 2019-06-06 DIAGNOSIS — M5030 Other cervical disc degeneration, unspecified cervical region: Secondary | ICD-10-CM

## 2019-06-06 DIAGNOSIS — Z0000 Encounter for general adult medical examination without abnormal findings: Secondary | ICD-10-CM | POA: Diagnosis not present

## 2019-06-06 DIAGNOSIS — M549 Dorsalgia, unspecified: Secondary | ICD-10-CM

## 2019-06-06 DIAGNOSIS — M542 Cervicalgia: Secondary | ICD-10-CM

## 2019-06-06 LAB — LIPID PANEL W/REFLEX DIRECT LDL
Cholesterol: 244 mg/dL — ABNORMAL HIGH (ref <200)
HDL: 99 mg/dL (ref >=50)
LDL Cholesterol (Calc): 126 mg/dL (calc) — ABNORMAL HIGH
Non-HDL Cholesterol (Calc): 145 mg/dL (calc) — ABNORMAL HIGH (ref <130)
Total CHOL/HDL Ratio: 2.5 (calc) (ref <5.0)
Triglycerides: 86 mg/dL (ref <150)

## 2019-06-06 LAB — COMPLETE METABOLIC PANEL WITH GFR
AG Ratio: 2.1 (calc) (ref 1.0–2.5)
ALT: 11 U/L (ref 6–29)
AST: 15 U/L (ref 10–30)
Albumin: 4.7 g/dL (ref 3.6–5.1)
Alkaline phosphatase (APISO): 46 U/L (ref 31–125)
BUN: 10 mg/dL (ref 7–25)
CO2: 28 mmol/L (ref 20–32)
Calcium: 9.3 mg/dL (ref 8.6–10.2)
Chloride: 104 mmol/L (ref 98–110)
Creat: 0.68 mg/dL (ref 0.50–1.10)
GFR, Est African American: 131 mL/min/{1.73_m2} (ref >=60)
GFR, Est Non African American: 113 mL/min/{1.73_m2} (ref >=60)
Globulin: 2.2 g/dL (calc) (ref 1.9–3.7)
Glucose, Bld: 97 mg/dL (ref 65–139)
Potassium: 4.2 mmol/L (ref 3.5–5.3)
Sodium: 139 mmol/L (ref 135–146)
Total Bilirubin: 0.4 mg/dL (ref 0.2–1.2)
Total Protein: 6.9 g/dL (ref 6.1–8.1)

## 2019-06-06 LAB — TSH: TSH: 1.43 mIU/L

## 2019-06-06 LAB — CBC
HCT: 36.1 % (ref 35.0–45.0)
Hemoglobin: 11.7 g/dL (ref 11.7–15.5)
MCH: 28 pg (ref 27.0–33.0)
MCHC: 32.4 g/dL (ref 32.0–36.0)
MCV: 86.4 fL (ref 80.0–100.0)
MPV: 10.9 fL (ref 7.5–12.5)
Platelets: 256 10*3/uL (ref 140–400)
RBC: 4.18 10*6/uL (ref 3.80–5.10)
RDW: 13.8 % (ref 11.0–15.0)
WBC: 6.5 10*3/uL (ref 3.8–10.8)

## 2019-06-06 LAB — ESTRADIOL: Estradiol: 152 pg/mL

## 2019-06-06 MED ORDER — ROPINIROLE HCL 0.25 MG PO TABS: 0.2500 mg | ORAL_TABLET | Freq: Three times a day (TID) | ORAL | 1 refills | Status: DC

## 2019-06-06 MED ORDER — IBUPROFEN 800 MG PO TABS: 800.0000 mg | ORAL_TABLET | Freq: Three times a day (TID) | ORAL | 1 refills | Status: DC | PRN

## 2019-06-06 NOTE — Patient Instructions (Addendum)
trintellix- less sexual side effects but could help with pain and mood.    Health Maintenance, Female Adopting a healthy lifestyle and getting preventive care are important in promoting health and wellness. Ask your health care provider about:  The right schedule for you to have regular tests and exams.  Things you can do on your own to prevent diseases and keep yourself healthy. What should I know about diet, weight, and exercise? Eat a healthy diet   Eat a diet that includes plenty of vegetables, fruits, low-fat dairy products, and lean protein.  Do not eat a lot of foods that are high in solid fats, added sugars, or sodium. Maintain a healthy weight Body mass index (BMI) is used to identify weight problems. It estimates body fat based on height and weight. Your health care provider can help determine your BMI and help you achieve or maintain a healthy weight. Get regular exercise Get regular exercise. This is one of the most important things you can do for your health. Most adults should:  Exercise for at least 150 minutes each week. The exercise should increase your heart rate and make you sweat (moderate-intensity exercise).  Do strengthening exercises at least twice a week. This is in addition to the moderate-intensity exercise.  Spend less time sitting. Even light physical activity can be beneficial. Watch cholesterol and blood lipids Have your blood tested for lipids and cholesterol at 36 years of age, then have this test every 5 years. Have your cholesterol levels checked more often if:  Your lipid or cholesterol levels are high.  You are older than 36 years of age.  You are at high risk for heart disease. What should I know about cancer screening? Depending on your health history and family history, you may need to have cancer screening at various ages. This may include screening for:  Breast cancer.  Cervical cancer.  Colorectal cancer.  Skin cancer.  Lung  cancer. What should I know about heart disease, diabetes, and high blood pressure? Blood pressure and heart disease  High blood pressure causes heart disease and increases the risk of stroke. This is more likely to develop in people who have high blood pressure readings, are of African descent, or are overweight.  Have your blood pressure checked: ? Every 3-5 years if you are 86-42 years of age. ? Every year if you are 13 years old or older. Diabetes Have regular diabetes screenings. This checks your fasting blood sugar level. Have the screening done:  Once every three years after age 51 if you are at a normal weight and have a low risk for diabetes.  More often and at a younger age if you are overweight or have a high risk for diabetes. What should I know about preventing infection? Hepatitis B If you have a higher risk for hepatitis B, you should be screened for this virus. Talk with your health care provider to find out if you are at risk for hepatitis B infection. Hepatitis C Testing is recommended for:  Everyone born from [birth year] through 1967.  Anyone with known risk factors for hepatitis C. Sexually transmitted infections (STIs)  Get screened for STIs, including gonorrhea and chlamydia, if: ? You are sexually active and are younger than 36 years of age. ? You are older than 36 years of age and your health care provider tells you that you are at risk for this type of infection. ? Your sexual activity has changed since you were last screened, and  you are at increased risk for chlamydia or gonorrhea. Ask your health care provider if you are at risk.  Ask your health care provider about whether you are at high risk for HIV. Your health care provider may recommend a prescription medicine to help prevent HIV infection. If you choose to take medicine to prevent HIV, you should first get tested for HIV. You should then be tested every 3 months for as long as you are taking the  medicine. Pregnancy  If you are about to stop having your period (premenopausal) and you may become pregnant, seek counseling before you get pregnant.  Take 400 to 800 micrograms (mcg) of folic acid every day if you become pregnant.  Ask for birth control (contraception) if you want to prevent pregnancy. Osteoporosis and menopause Osteoporosis is a disease in which the bones lose minerals and strength with aging. This can result in bone fractures. If you are 38 years old or older, or if you are at risk for osteoporosis and fractures, ask your health care provider if you should:  Be screened for bone loss.  Take a calcium or vitamin D supplement to lower your risk of fractures.  Be given hormone replacement therapy (HRT) to treat symptoms of menopause. Follow these instructions at home: Lifestyle  Do not use any products that contain nicotine or tobacco, such as cigarettes, e-cigarettes, and chewing tobacco. If you need help quitting, ask your health care provider.  Do not use street drugs.  Do not share needles.  Ask your health care provider for help if you need support or information about quitting drugs. Alcohol use  Do not drink alcohol if: ? Your health care provider tells you not to drink. ? You are pregnant, may be pregnant, or are planning to become pregnant.  If you drink alcohol: ? Limit how much you use to 0-1 drink a day. ? Limit intake if you are breastfeeding.  Be aware of how much alcohol is in your drink. In the [place] one drink equals one 12 oz bottle of beer (355 mL), one 5 oz glass of wine (148 mL), or one 1 oz glass of hard liquor (44 mL). General instructions  Schedule regular health, dental, and eye exams.  Stay current with your vaccines.  Tell your health care provider if: ? You often feel depressed. ? You have ever been abused or do not feel safe at home. Summary  Adopting a healthy lifestyle and getting preventive care are important in  promoting health and wellness.  Follow your health care provider's instructions about healthy diet, exercising, and getting tested or screened for diseases.  Follow your health care provider's instructions on monitoring your cholesterol and blood pressure. This information is not intended to replace advice given to you by your health care provider. Make sure you discuss any questions you have with your health care provider. Document Revised: 03/24/2018 Document Reviewed: 03/24/2018 Elsevier Patient Education  2020 [employer].
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
## 2019-06-07 DIAGNOSIS — M549 Dorsalgia, unspecified: Secondary | ICD-10-CM | POA: Insufficient documentation

## 2019-06-07 DIAGNOSIS — M5030 Other cervical disc degeneration, unspecified cervical region: Secondary | ICD-10-CM | POA: Insufficient documentation

## 2019-06-07 NOTE — Progress Notes (Signed)
Call pt:   Estrogen level looks good. Potassium, calcium, liver, kidey, glucose all look good and have normalized since after cholecystectomy. Thyroid looks great.  Hemoglobin barely in normal range. Increase iron rich foods or start a morning supplement. Cholesterol is good for age and risk factors. Goal LDL is under 100 and yours is 126. HDL is wonderful.

## 2019-06-08 ENCOUNTER — Ambulatory Visit: Payer: Managed Care, Other (non HMO) | Admitting: Sports Medicine

## 2019-06-15 ENCOUNTER — Telehealth: Payer: Self-pay | Admitting: Physician Assistant

## 2019-06-15 ENCOUNTER — Other Ambulatory Visit: Payer: Self-pay | Admitting: Physician Assistant

## 2019-06-16 NOTE — Telephone Encounter (Signed)
Spoke with patient. She states psychiatry added Vyvanse 30 mg back to her medication regimen. They are working on the prior authorization. If they need any notes from [place] to help with medical history I did let patient know we would be happy to send to help in authorization process but since we did not write medication we could not complete. She expressed understanding. [place].

## 2019-06-20 ENCOUNTER — Other Ambulatory Visit: Payer: Self-pay | Admitting: Physician Assistant

## 2019-06-20 DIAGNOSIS — G2581 Restless legs syndrome: Secondary | ICD-10-CM

## 2019-06-23 ENCOUNTER — Ambulatory Visit (INDEPENDENT_AMBULATORY_CARE_PROVIDER_SITE_OTHER): Payer: Managed Care, Other (non HMO) | Admitting: Sports Medicine

## 2019-06-23 ENCOUNTER — Other Ambulatory Visit: Payer: Self-pay

## 2019-06-23 ENCOUNTER — Ambulatory Visit (INDEPENDENT_AMBULATORY_CARE_PROVIDER_SITE_OTHER): Payer: Managed Care, Other (non HMO)

## 2019-06-23 ENCOUNTER — Telehealth: Payer: Self-pay | Admitting: Physician Assistant

## 2019-06-23 DIAGNOSIS — M5134 Other intervertebral disc degeneration, thoracic region: Secondary | ICD-10-CM | POA: Diagnosis not present

## 2019-06-23 DIAGNOSIS — M542 Cervicalgia: Secondary | ICD-10-CM

## 2019-06-23 DIAGNOSIS — G8929 Other chronic pain: Secondary | ICD-10-CM

## 2019-06-23 DIAGNOSIS — M545 Low back pain: Secondary | ICD-10-CM | POA: Diagnosis not present

## 2019-06-23 MED ORDER — MELOXICAM 15 MG PO TABS: ORAL_TABLET | ORAL | 3 refills | Status: DC

## 2019-06-23 NOTE — Assessment & Plan Note (Addendum)
This pleasant 34 year old female has also had fairly chronic and severe neck pain, bilateral right worse than left with radiation to the right upper shoulder, she did have a neck MRI back in 2016 that was for the most part unremarkable. She had several tender trigger points, we injected her right trapezial trigger point today. I do think we need updated cervical spine x-rays and an updated cervical spine MRI as well, she will continue therapy with Baird Lyons. Overall symptoms have been present greater than 6 weeks in spite of physician directed physical therapy. If insufficient improvement at the follow-up visit we will consider neuropathic agents including SNRIs and/or gabapentin. Switching from ibuprofen to meloxicam.

## 2019-06-23 NOTE — Progress Notes (Addendum)
    Procedures performed today:    Procedure:  Injection of paracervical and paralumbar trigger points Consent obtained and verified. Time-out conducted. Noted no overlying erythema, induration, or other signs of local infection. Skin prepped in a sterile fashion. Topical analgesic spray: Ethyl chloride. Completed without difficulty. Meds: A total of 1 cc Kenalog 40, 40 cc lidocaine spread out between the right trapezial, right quadratus lumborum and right paralumbar trigger points Pain immediately improved suggesting accurate placement of the medication. Advised to call if fevers/chills, erythema, induration, drainage, or persistent bleeding.  Independent interpretation of notes and tests performed by another provider:   None.  Impression and Recommendations:    Chronic low back pain This is a pleasant 34 year old female with chronic low back pain, she has had this now for years, she is doing therapy. She has persistent discomfort in the thoracic and the lumbar spine, proceeding with thoracic and lumbar spine x-rays today, adding MRIs of the thoracic and lumbar spine as well. I performed 2 trigger point injections today on her right parathoracic and paralumbar musculature. Return to see me after 4 to 6 weeks of physical therapy (she will do this with Baird Lyons in [facility]) and if insufficient improvement we will consider adding neuropathic agents. Switching from ibuprofen to meloxicam.  Chronic neck pain This pleasant 34 year old female has also had fairly chronic and severe neck pain, bilateral right worse than left with radiation to the right upper shoulder, she did have a neck MRI back in 2016 that was for the most part unremarkable. She had several tender trigger points, we injected her right trapezial trigger point today. I do think we need updated cervical spine x-rays and an updated cervical spine MRI as well, she will continue therapy with Baird Lyons. Overall symptoms have  been present greater than 6 weeks in spite of physician directed physical therapy. If insufficient improvement at the follow-up visit we will consider neuropathic agents including SNRIs and/or gabapentin. Switching from ibuprofen to meloxicam.    ___________________________________________ Gwen Her. Dianah Field, M.D., ABFM., CAQSM. Primary Care and [place] Instructor of [employer] [facility]

## 2019-06-23 NOTE — Addendum Note (Signed)
Addended by: Silverio Decamp on: 06/23/2019 11:49 AM   Modules accepted: Orders

## 2019-06-23 NOTE — Telephone Encounter (Signed)
I faxed PT referral to [facility] at [phone] - P and [phone] - F they will call and schedule with patient for good appointment time.  The referral is in the que because it states Baird Lyons is a provider that is not allowed and has to be fixed. I just wanted it stated that the referral was sent and not just sitting in the que. - CF

## 2019-06-23 NOTE — Assessment & Plan Note (Addendum)
This is a pleasant 34 year old female with chronic low back pain, she has had this now for years, she is doing therapy. She has persistent discomfort in the thoracic and the lumbar spine, proceeding with thoracic and lumbar spine x-rays today, adding MRIs of the thoracic and lumbar spine as well. I performed 2 trigger point injections today on her right parathoracic and paralumbar musculature. Return to see me after 4 to 6 weeks of physical therapy (she will do this with Baird Lyons in [facility]) and if insufficient improvement we will consider adding neuropathic agents. Switching from ibuprofen to meloxicam.

## 2019-07-01 ENCOUNTER — Telehealth (INDEPENDENT_AMBULATORY_CARE_PROVIDER_SITE_OTHER): Payer: Managed Care, Other (non HMO) | Admitting: Physician Assistant

## 2019-07-01 ENCOUNTER — Encounter: Payer: Self-pay | Admitting: Physician Assistant

## 2019-07-01 VITALS — BP 120/70 | Ht 60.0 in | Wt 121.0 lb

## 2019-07-01 DIAGNOSIS — F4310 Post-traumatic stress disorder, unspecified: Secondary | ICD-10-CM

## 2019-07-01 DIAGNOSIS — F900 Attention-deficit hyperactivity disorder, predominantly inattentive type: Secondary | ICD-10-CM | POA: Diagnosis not present

## 2019-07-01 DIAGNOSIS — F410 Panic disorder [episodic paroxysmal anxiety] without agoraphobia: Secondary | ICD-10-CM | POA: Diagnosis not present

## 2019-07-01 DIAGNOSIS — F411 Generalized anxiety disorder: Secondary | ICD-10-CM

## 2019-07-01 DIAGNOSIS — F331 Major depressive disorder, recurrent, moderate: Secondary | ICD-10-CM

## 2019-07-01 DIAGNOSIS — F5105 Insomnia due to other mental disorder: Secondary | ICD-10-CM

## 2019-07-01 DIAGNOSIS — F419 Anxiety disorder, unspecified: Secondary | ICD-10-CM

## 2019-07-01 DIAGNOSIS — F99 Mental disorder, not otherwise specified: Secondary | ICD-10-CM

## 2019-07-01 MED ORDER — CLONAZEPAM 0.5 MG PO TABS: ORAL_TABLET | ORAL | 0 refills | Status: DC

## 2019-07-04 ENCOUNTER — Ambulatory Visit (HOSPITAL_BASED_OUTPATIENT_CLINIC_OR_DEPARTMENT_OTHER)
Admission: RE | Admit: 2019-07-04 | Discharge: 2019-07-04 | Disposition: A | Discharge status: Home / Self Care | Payer: Managed Care, Other (non HMO) | Source: Ambulatory Visit | Attending: Nurse Practitioner | Admitting: Nurse Practitioner

## 2019-07-04 ENCOUNTER — Ambulatory Visit (INDEPENDENT_AMBULATORY_CARE_PROVIDER_SITE_OTHER): Payer: Managed Care, Other (non HMO) | Admitting: Nurse Practitioner

## 2019-07-04 ENCOUNTER — Other Ambulatory Visit: Payer: Self-pay

## 2019-07-04 ENCOUNTER — Encounter: Payer: Self-pay | Admitting: Physician Assistant

## 2019-07-04 ENCOUNTER — Encounter: Payer: Self-pay | Admitting: Nurse Practitioner

## 2019-07-04 VITALS — BP 126/83 | HR 88 | Temp 98.5°F | Ht 60.0 in | Wt 126.1 lb

## 2019-07-04 DIAGNOSIS — R1030 Lower abdominal pain, unspecified: Secondary | ICD-10-CM | POA: Diagnosis present

## 2019-07-04 DIAGNOSIS — R110 Nausea: Secondary | ICD-10-CM

## 2019-07-04 DIAGNOSIS — M545 Low back pain, unspecified: Secondary | ICD-10-CM

## 2019-07-04 LAB — POCT URINALYSIS DIP (CLINITEK)
Bilirubin, UA: NEGATIVE
Blood, UA: NEGATIVE
Glucose, UA: NEGATIVE mg/dL
Ketones, POC UA: NEGATIVE mg/dL
Leukocytes, UA: NEGATIVE
Nitrite, UA: NEGATIVE
POC PROTEIN,UA: NEGATIVE
Spec Grav, UA: 1.015 (ref 1.010–1.025)
Urobilinogen, UA: 0.2 E.U./dL
pH, UA: 7 (ref 5.0–8.0)

## 2019-07-04 MED ORDER — ONDANSETRON 8 MG PO TBDP: 8.0000 mg | ORAL_TABLET | Freq: Three times a day (TID) | ORAL | 3 refills | Status: DC | PRN

## 2019-07-04 MED ORDER — TAMSULOSIN HCL 0.4 MG PO CAPS: 0.4000 mg | ORAL_CAPSULE | Freq: Every day | ORAL | 3 refills | Status: DC

## 2019-07-04 MED ORDER — OXYCODONE-ACETAMINOPHEN 5-325 MG PO TABS: 1.0000 | ORAL_TABLET | Freq: Four times a day (QID) | ORAL | 0 refills | Status: DC | PRN

## 2019-07-04 NOTE — Progress Notes (Signed)
Acute Office Visit  Subjective:    Patient ID: Annette Cook, female    DOB: 1986/02/02, 34 y.o.   MRN: [id number]  Chief Complaint  Patient presents with  . Back Pain    HPI Patient is in today for low back pain on the right greater than left and suprapubic abdominal pain that started this morning and has progressively worsening throughout the day. She reports the pain is constantly present as a dull ache, but increases to a sharp/stabbing pain intermittently. Her pain is not associated with eating, moving, bowel movements, or urination. She also reports urinary frequency and urgency, as well as nausea. She has taken 800mg  ibuprofen with no relief of symptoms.   She also reports symptoms of chills, without fever and decreased appetite. She does feel that she has had some increased vaginal discharge without odor, pain, burning, or itching. She does not have any new sexual partners, she is in a monogamous relationship with her husband. She denies the chance of pregnancy- her husband had a vasectomy and she recently had her menstrual period. She had 2-3 days of diarrhea last week, prior to any other symptoms, but this has since resolved.   Past Medical History:  Diagnosis Date  . ADHD (attention deficit hyperactivity disorder)   . Anxiety   . GERD (gastroesophageal reflux disease)   . Headache   . Insomnia   . Seizure ([place]) 01/06/2019   last Oct 2020 with weeks of memory loss    Past Surgical History:  Procedure Laterality Date  . APPENDECTOMY    . BILIARY STENT PLACEMENT  03/24/2019   Procedure: BILIARY STENT PLACEMENT;  Surgeon: Clarene Essex, MD;  Location: [facility];  Service: Endoscopy;;  . CHOLECYSTECTOMY N/A 03/17/2019   Procedure: LAPAROSCOPIC CHOLECYSTECTOMY;  Surgeon: Ralene Ok, MD;  Location: [facility];  Service: General;  Laterality: N/A;  . ERCP N/A 03/24/2019   Procedure: ENDOSCOPIC RETROGRADE CHOLANGIOPANCREATOGRAPHY (ERCP);  Surgeon: Clarene Essex, MD;  Location: [facility];  Service: Endoscopy;  Laterality: N/A;  . ERCP N/A 05/17/2019   Procedure: ENDOSCOPIC RETROGRADE CHOLANGIOPANCREATOGRAPHY (ERCP);  Surgeon: Clarene Essex, MD;  Location: [facility];  Service: Endoscopy;  Laterality: N/A;  with stent removal  . SPHINCTEROTOMY  03/24/2019   Procedure: SPHINCTEROTOMY;  Surgeon: Clarene Essex, MD;  Location: [facility] ENDOSCOPY;  Service: Endoscopy;;  . STENT REMOVAL  05/17/2019   Procedure: STENT REMOVAL;  Surgeon: Clarene Essex, MD;  Location: [facility] ENDOSCOPY;  Service: Endoscopy;;  . TONSILLECTOMY AND ADENOIDECTOMY    . WISDOM TOOTH EXTRACTION      Family History  Problem Relation Age of Onset  . Hypertension Mother   . Stroke Mother     Social History   Socioeconomic History  . Marital status: Married    Spouse name: Not on file  . Number of children: Not on file  . Years of education: Not on file  . Highest education level: Not on file  Occupational History  . Occupation: Network engineer  Tobacco Use  . Smoking status: Former Smoker    Packs/day: 1.00    Years: 7.00    Pack years: 7.00    Types: Cigarettes    Quit date: 10/22/2004    Years since quitting: 14.7  . Smokeless tobacco: Never Used  Substance and Sexual Activity  . Alcohol use: Yes    Comment: socially  . Drug use: No  . Sexual activity: Yes    Partners: Male  Other Topics Concern  . Not on file  Social History Narrative   Marital Status: Married Barrister's clerk)    Children:  Son Nurse, learning disability) Daughter (Remi)    Pets: None    Living Situation: Lives with husband, step-daughter & children.    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      Alcohol Use:  None   Drug Use:  None   Diet:  Regular   Exercise:  None   Hobbies: Fishing [employer])          Social Determinants of Health   Financial Resource Strain:   . Difficulty of Paying Living Expenses:    Food Insecurity:   . Worried About Charity fundraiser in the Last Year:   . Arboriculturist in the Last Year:   Transportation Needs:   . Film/video editor (Medical):   [name] Lack of Transportation (Non-Medical):   Physical Activity:   . Days of Exercise per Week:   . Minutes of Exercise per Session:   Stress:   . Feeling of Stress :   Social Connections:   . Frequency of Communication with Friends and Family:   . Frequency of Social Gatherings with Friends and Family:   . Attends Religious Services:   . Active Member of Clubs or Organizations:   . Attends Archivist Meetings:   [name] Marital Status:   Intimate Partner Violence:   . Fear of Current or Ex-Partner:   . Emotionally Abused:   [name] Physically Abused:   . Sexually Abused:     Outpatient Medications Prior to Visit  Medication Sig Dispense Refill  . amphetamine-dextroamphetamine (ADDERALL XR) 20 MG 24 hr capsule Take 20 mg by mouth daily.    . clonazePAM (KLONOPIN) 0.5 MG tablet Take as needed for panic attacks no more than once a day. 10 tablet 0  . ferrous sulfate (FER-IN-SOL) 75 (15 Fe) MG/ML SOLN Take by mouth.    . hydrOXYzine (ATARAX/VISTARIL) 50 MG tablet TAKE 1-2 TABLETS BY MOUTH 2 (TWO) TIMES DAILY. TAKE 50 MG IN THE MORNING AND 100 MG IN THE EVENING (Patient taking differently: Take 50 mg by mouth every 4 (four) hours as needed for anxiety. ) 360 tablet 1  . magnesium oxide (MAG-OX) 400 MG tablet Take 400 mg by mouth daily.    . Melatonin 5 MG CAPS Take 5 mg by mouth at bedtime.    . meloxicam (MOBIC) 15 MG tablet One tab PO qAM with a meal for 2 weeks, then daily prn pain. 30 tablet 3  . Oxcarbazepine (TRILEPTAL) 300 MG tablet Take 300 mg by mouth 2 (two) times daily.    . pantoprazole (PROTONIX) 40 MG tablet Take 40 mg by mouth daily. Takes every other day    . QUEtiapine (SEROQUEL) 50 MG tablet Take 50 mg by mouth 2 times daily at 12 noon and 4 pm. 50 mg in the morning, 100 mg in the evening    .  tiZANidine (ZANAFLEX) 2 MG tablet Take 4 mg by mouth at bedtime.     . Turmeric 500 MG CAPS Take by mouth.     No facility-administered medications prior to visit.    No Known Allergies  Review of Systems  Constitutional: Positive for appetite change and chills. Negative for fatigue and fever.  Cardiovascular: Negative for chest pain.  Gastrointestinal: Positive for abdominal pain and nausea. Negative for abdominal  distention, blood in stool, constipation, diarrhea, rectal pain and vomiting.  Genitourinary: Positive for flank pain, frequency, urgency and vaginal discharge. Negative for decreased urine volume, difficulty urinating, dyspareunia, dysuria, genital sores, hematuria, menstrual problem, pelvic pain, vaginal bleeding and vaginal pain.  Musculoskeletal: Positive for back pain.  Skin: Negative for rash and wound.  Neurological: Negative for weakness and headaches.       Objective:    Physical Exam Constitutional:      Appearance: Normal appearance. She is normal weight.  HENT:     Head: Normocephalic.  Cardiovascular:     Rate and Rhythm: Normal rate and regular rhythm.     Pulses: Normal pulses.  Pulmonary:     Effort: Pulmonary effort is normal.     Breath sounds: Normal breath sounds.  Abdominal:     General: Abdomen is flat. Bowel sounds are increased. There is no distension or abdominal bruit.     Palpations: Abdomen is soft. There is no mass.     Tenderness: There is abdominal tenderness in the right lower quadrant, suprapubic area and left lower quadrant. There is right CVA tenderness and guarding. There is no left CVA tenderness or rebound. Negative signs include Murphy's sign, McBurney's sign and obturator sign.     Hernia: No hernia is present.  Musculoskeletal:        General: Normal range of motion.     Right lower leg: No edema.     Left lower leg: No edema.  Skin:    General: Skin is warm and dry.     Capillary Refill: Capillary refill takes less than 2  seconds.  Neurological:     General: No focal deficit present.     Mental Status: She is alert and oriented to person, place, and time.  Psychiatric:        Mood and Affect: Mood normal.        Behavior: Behavior normal.        Thought Content: Thought content normal.        Judgment: Judgment normal.     BP 126/83   Pulse 88   Temp 98.5 F (36.9 C) (Oral)   Ht 5' (1.524 m)   Wt 126 lb 1.9 oz (57.2 kg)   LMP 06/17/2019   SpO2 99%   BMI 24.63 kg/m  Wt Readings from Last 3 Encounters:  07/04/19 126 lb 1.9 oz (57.2 kg)  07/01/19 121 lb (54.9 kg)  06/06/19 124 lb (56.2 kg)    Health Maintenance Due  Topic Date Due  . PAP SMEAR-Modifier  07/13/2017    There are no preventive care reminders to display for this patient.   Lab Results  Component Value Date   TSH 1.43 06/06/2019   Lab Results  Component Value Date   WBC 6.5 06/06/2019   HGB 11.7 06/06/2019   HCT 36.1 06/06/2019   MCV 86.4 06/06/2019   PLT 256 06/06/2019   Lab Results  Component Value Date   NA 139 06/06/2019   K 4.2 06/06/2019   CO2 28 06/06/2019   GLUCOSE 97 06/06/2019   BUN 10 06/06/2019   CREATININE 0.68 06/06/2019   BILITOT 0.4 06/06/2019   ALKPHOS 135 (H) 03/27/2019   AST 15 06/06/2019   ALT 11 06/06/2019   PROT 6.9 06/06/2019   ALBUMIN 2.5 (L) 03/27/2019   CALCIUM 9.3 06/06/2019   ANIONGAP 10 03/27/2019   Lab Results  Component Value Date   CHOL 244 (H) 06/06/2019   Lab Results  Component Value Date   HDL 99 06/06/2019   Lab Results  Component Value Date   LDLCALC 126 (H) 06/06/2019   Lab Results  Component Value Date   TRIG 86 06/06/2019   Lab Results  Component Value Date   CHOLHDL 2.5 06/06/2019   No results found for: HGBA1C     Assessment & Plan:  1. Acute bilateral low back pain without sciatica Bilateral low back pain on the right greater than the left starting earlier today with progressive worsening.  Concerning for presence of kidney stone due to waxing  and waning of pain and urinary frequency and urgency. Urinalysis in office today normal. Patient sent for stat CT renal stone study. Prescription for Flomax, 5 days of pain medication, and Zofran provided for symptom management. Patient provided with information on kidney stones and urine strainer with instructions to bring any stone in if present. Will contact patient about follow-up once results are received. - POCT URINALYSIS DIP (CLINITEK) - CT RENAL STONE STUDY - tamsulosin (FLOMAX) 0.4 MG CAPS capsule; Take 1 capsule (0.4 mg total) by mouth daily.  Dispense: 30 capsule; Refill: 3 - oxyCODONE-acetaminophen (PERCOCET) 5-325 MG tablet; Take 1-2 tablets by mouth every 6 (six) hours as needed for up to 5 days for severe pain. Use sparingly to avoid tolerance/dependence  Dispense: 15 tablet; Refill: 0  2. Lower abdominal pain Generalized bilateral lower abdominal pain with nausea present on exam.  This is concerning for the presence of a kidney stone due to presentation and symptoms.  May consider GI etiology given the patient's episodes of diarrhea last week however seeing that these have resolved I feel this is less likely. Urinalysis in the office today normal.  Patient sent for stat CT renal stone study. Prescription for Flomax, 5 days of pain medication, and Zofran provided for symptom management. Patient provided with information on kidney stones and urine strainer with instructions to bring the stone if present. We will contact the patient for follow-up once results are received. - CT RENAL STONE STUDY - tamsulosin (FLOMAX) 0.4 MG CAPS capsule; Take 1 capsule (0.4 mg total) by mouth daily.  Dispense: 30 capsule; Refill: 3 - oxyCODONE-acetaminophen (PERCOCET) 5-325 MG tablet; Take 1-2 tablets by mouth every 6 (six) hours as needed for up to 5 days for severe pain. Use sparingly to avoid tolerance/dependence  Dispense: 15 tablet; Refill: 0  3. Nausea Intermittent nausea in the setting of  lower back and abdominal pain.  Strongly suspect kidney stone presence.  Prescription for Zofran sent to the pharmacy for symptom management. - ondansetron (ZOFRAN-ODT) 8 MG disintegrating tablet; Take 1 tablet (8 mg total) by mouth every 8 (eight) hours as needed for nausea.  Dispense: 20 tablet; Refill: 3  Follow-up if symptoms worsen or fail to improve.  Based on results of scan follow-up may be scheduled once results received.   Orma Render, NP

## 2019-07-04 NOTE — Patient Instructions (Signed)
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
## 2019-07-06 ENCOUNTER — Encounter: Payer: Self-pay | Admitting: Physician Assistant

## 2019-07-08 ENCOUNTER — Ambulatory Visit (INDEPENDENT_AMBULATORY_CARE_PROVIDER_SITE_OTHER): Payer: Managed Care, Other (non HMO) | Admitting: Physician Assistant

## 2019-07-08 ENCOUNTER — Other Ambulatory Visit: Payer: Self-pay

## 2019-07-08 VITALS — BP 127/81 | HR 102 | Ht 60.0 in | Wt 118.0 lb

## 2019-07-08 DIAGNOSIS — R82998 Other abnormal findings in urine: Secondary | ICD-10-CM

## 2019-07-08 DIAGNOSIS — K1379 Other lesions of oral mucosa: Secondary | ICD-10-CM

## 2019-07-08 DIAGNOSIS — N83202 Unspecified ovarian cyst, left side: Secondary | ICD-10-CM | POA: Diagnosis not present

## 2019-07-08 DIAGNOSIS — K5900 Constipation, unspecified: Secondary | ICD-10-CM

## 2019-07-08 DIAGNOSIS — K921 Melena: Secondary | ICD-10-CM | POA: Diagnosis not present

## 2019-07-08 DIAGNOSIS — N200 Calculus of kidney: Secondary | ICD-10-CM

## 2019-07-08 LAB — POCT URINALYSIS DIP (CLINITEK)
Bilirubin, UA: NEGATIVE
Blood, UA: NEGATIVE
Glucose, UA: NEGATIVE mg/dL
Ketones, POC UA: NEGATIVE mg/dL
Nitrite, UA: NEGATIVE
POC PROTEIN,UA: NEGATIVE
Spec Grav, UA: 1.015 (ref 1.010–1.025)
Urobilinogen, UA: 0.2 E.U./dL
pH, UA: 7.5 (ref 5.0–8.0)

## 2019-07-08 MED ORDER — NITROFURANTOIN MONOHYD MACRO 100 MG PO CAPS: 100.0000 mg | ORAL_CAPSULE | Freq: Two times a day (BID) | ORAL | 0 refills | Status: DC

## 2019-07-08 MED ORDER — POLYETHYLENE GLYCOL 3350 17 GM/SCOOP PO POWD: 17.0000 g | Freq: Two times a day (BID) | ORAL | 0 refills | Status: DC | PRN

## 2019-07-08 MED ORDER — KETOROLAC TROMETHAMINE 60 MG/2ML IM SOLN
60.0000 mg | Freq: Once | INTRAMUSCULAR | Status: AC
  Administered 2019-07-08: 60 mg via INTRAMUSCULAR

## 2019-07-08 MED ORDER — NYSTATIN 100000 UNIT/ML MT SUSP: 500000.0000 [IU] | Freq: Four times a day (QID) | OROMUCOSAL | 0 refills | Status: DC

## 2019-07-08 NOTE — Patient Instructions (Addendum)
Push fluids.  Start flomax.  Start macrobid for infection.  Percocet for pain.  Nystatin mouthwash.     Kidney Stones Kidney stones are rock-like masses that form inside of the kidneys. Kidneys are organs that make pee (urine). A kidney stone may move into other parts of the urinary tract, including:  The tubes that connect the kidneys to the bladder (ureters).  The bladder.  The tube that carries urine out of the body (urethra). Kidney stones can cause very bad pain and can block the flow of pee. The stone usually leaves your body (passes) through your pee. You may need to have a doctor take out the stone. What are the causes? Kidney stones may be caused by:  A condition in which certain glands make too much parathyroid hormone (primary hyperparathyroidism).  A buildup of a type of crystals in the bladder made of a chemical called uric acid. The body makes uric acid when you eat certain foods.  Narrowing (stricture) of one or both of the ureters.  A kidney blockage that you were born with.  Past surgery on the kidney or the ureters, such as gastric bypass surgery. What increases the risk? You are more likely to develop this condition if:  You have had a kidney stone in the past.  You have a family history of kidney stones.  You do not drink enough water.  You eat a diet that is high in protein, salt (sodium), or sugar.  You are overweight or very overweight (obese). What are the signs or symptoms? Symptoms of a kidney stone may include:  Pain in the side of the belly, right below the ribs (flank pain). Pain usually spreads (radiates) to the groin.  Needing to pee often or right away (urgently).  Pain when going pee (urinating).  Blood in your pee (hematuria).  Feeling like you may vomit (nauseous).  Vomiting.  Fever and chills. How is this treated? Treatment depends on the size, location, and makeup of the kidney stones. The stones will often pass out of the  body through peeing. You may need to:  Drink more fluid to help pass the stone. In some cases, you may be given fluids through an IV tube put into one of your veins at the hospital.  Take medicine for pain.  Make changes in your diet to help keep kidney stones from coming back. Sometimes, medical procedures are needed to remove a kidney stone. This may involve:  A procedure to break up kidney stones using a beam of light (laser) or shock waves.  Surgery to remove the kidney stones. Follow these instructions at home: Medicines  Take over-the-counter and prescription medicines only as told by your doctor.  Ask your doctor if the medicine prescribed to you requires you to avoid driving or using heavy machinery. Eating and drinking  Drink enough fluid to keep your pee pale yellow. You may be told to drink at least 8-10 glasses of water each day. This will help you pass the stone.  If told by your doctor, change your diet. This may include: ? Limiting how much salt you eat. ? Eating more fruits and vegetables. ? Limiting how much meat, poultry, fish, and eggs you eat.  Follow instructions from your doctor about eating or drinking restrictions. General instructions  Collect pee samples as told by your doctor. You may need to collect a pee sample: ? 24 hours after a stone comes out. ? 8-12 weeks after a stone comes out, and  every 6-12 months after that.  Strain your pee every time you pee (urinate), for as long as told. Use the strainer that your doctor recommends.  Do not throw out the stone. Keep it so that it can be tested by your doctor.  Keep all follow-up visits as told by your doctor. This is important. You may need follow-up tests. How is this prevented? To prevent another kidney stone:  Drink enough fluid to keep your pee pale yellow. This is the best way to prevent kidney stones.  Eat healthy foods.  Avoid certain foods as told by your doctor. You may be told to eat  less protein.  Stay at a healthy weight. Where to find more information  [place] [facility]): www.kidney.[facility]): www.urologyhealth.org Contact a doctor if:  You have pain that gets worse or does not get better with medicine. Get help right away if:  You have a fever or chills.  You get very bad pain.  You get new pain in your belly (abdomen).  You pass out (faint).  You cannot pee. Summary  Kidney stones are rock-like masses that form inside of the kidneys.  Kidney stones can cause very bad pain and can block the flow of pee.  The stones will often pass out of the body through peeing.  Drink enough fluid to keep your pee pale yellow. This information is not intended to replace advice given to you by your health care provider. Make sure you discuss any questions you have with your health care provider. Document Revised: 08/17/2018 Document Reviewed: 08/17/2018 Elsevier Patient Education  [place].

## 2019-07-09 LAB — COMPLETE METABOLIC PANEL WITH GFR
AG Ratio: 2.3 (calc) (ref 1.0–2.5)
ALT: 22 U/L (ref 6–29)
AST: 14 U/L (ref 10–30)
Albumin: 4.6 g/dL (ref 3.6–5.1)
Alkaline phosphatase (APISO): 42 U/L (ref 31–125)
BUN/Creatinine Ratio: 6 (calc) (ref 6–22)
BUN: 5 mg/dL — ABNORMAL LOW (ref 7–25)
CO2: 29 mmol/L (ref 20–32)
Calcium: 9.5 mg/dL (ref 8.6–10.2)
Chloride: 100 mmol/L (ref 98–110)
Creat: 0.79 mg/dL (ref 0.50–1.10)
GFR, Est African American: 112 mL/min/{1.73_m2} (ref >=60)
GFR, Est Non African American: 96 mL/min/{1.73_m2} (ref >=60)
Globulin: 2 g/dL (calc) (ref 1.9–3.7)
Glucose, Bld: 101 mg/dL (ref 65–139)
Potassium: 4 mmol/L (ref 3.5–5.3)
Sodium: 138 mmol/L (ref 135–146)
Total Bilirubin: 0.3 mg/dL (ref 0.2–1.2)
Total Protein: 6.6 g/dL (ref 6.1–8.1)

## 2019-07-09 LAB — CBC WITH DIFFERENTIAL/PLATELET
Absolute Monocytes: 637 cells/uL (ref 200–950)
Basophils Absolute: 19 cells/uL (ref 0–200)
Basophils Relative: 0.2 %
Eosinophils Absolute: 67 cells/uL (ref 15–500)
Eosinophils Relative: 0.7 %
HCT: 41.3 % (ref 35.0–45.0)
Hemoglobin: 13.3 g/dL (ref 11.7–15.5)
Lymphs Abs: 1720 cells/uL (ref 850–3900)
MCH: 28.7 pg (ref 27.0–33.0)
MCHC: 32.2 g/dL (ref 32.0–36.0)
MCV: 89 fL (ref 80.0–100.0)
MPV: 10.3 fL (ref 7.5–12.5)
Monocytes Relative: 6.7 %
Neutro Abs: 7059 cells/uL (ref 1500–7800)
Neutrophils Relative %: 74.3 %
Platelets: 239 10*3/uL (ref 140–400)
RBC: 4.64 10*6/uL (ref 3.80–5.10)
RDW: 16.5 % — ABNORMAL HIGH (ref 11.0–15.0)
Total Lymphocyte: 18.1 %
WBC: 9.5 10*3/uL (ref 3.8–10.8)

## 2019-07-11 ENCOUNTER — Encounter: Payer: Self-pay | Admitting: Physician Assistant

## 2019-07-11 ENCOUNTER — Other Ambulatory Visit: Payer: Self-pay | Admitting: Physician Assistant

## 2019-07-11 DIAGNOSIS — K5909 Other constipation: Secondary | ICD-10-CM | POA: Insufficient documentation

## 2019-07-11 DIAGNOSIS — N83202 Unspecified ovarian cyst, left side: Secondary | ICD-10-CM | POA: Insufficient documentation

## 2019-07-11 DIAGNOSIS — R1030 Lower abdominal pain, unspecified: Secondary | ICD-10-CM

## 2019-07-11 DIAGNOSIS — N200 Calculus of kidney: Secondary | ICD-10-CM | POA: Insufficient documentation

## 2019-07-11 DIAGNOSIS — M545 Low back pain, unspecified: Secondary | ICD-10-CM

## 2019-07-11 DIAGNOSIS — R82998 Other abnormal findings in urine: Secondary | ICD-10-CM | POA: Insufficient documentation

## 2019-07-11 DIAGNOSIS — K5900 Constipation, unspecified: Secondary | ICD-10-CM | POA: Insufficient documentation

## 2019-07-11 DIAGNOSIS — K921 Melena: Secondary | ICD-10-CM | POA: Insufficient documentation

## 2019-07-11 NOTE — Progress Notes (Signed)
Subjective:    Patient ID: Annette Cook, female    DOB: Apr 10, 1986, 34 y.o.   MRN: [id number]  HPI  Pt is a 34 yo female with concerns after diagnosis of right kidney stone, left ovarian cyst, constipation. She continues to have pain and wants to know the next steps. She was seen on 3/22 and CT scan confirmed dx of 85mm right distal kidney stone, 3cm ovarian cyst, and constipation. Pt was given oxycodone, flomax but has not started them. She stated she wanted to make sure it was ok. She does not feel like she has past stone. She feels like she is getting more pain with urination at this point. No fever, chills, vomiting. She is very nauseated. She feels like her epigastric pain is worsening since stopping protonix last week as recommended by psychiatry. She has not had a great bowel movement. She does report black stools. She does take iron daily.   .. Active Ambulatory Problems    Diagnosis Date Noted  . 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 Generalized anxiety disorder 10/22/2012  . Insomnia 10/22/2012  . Panic attack 10/22/2012  . ADHD (attention deficit hyperactivity disorder) 10/22/2012  . Acute stress reaction 07/08/2013  . 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 Anxiety and depression 09/03/2015  . Takes daily multivitamins 09/12/2016  . Opioid use agreement exists 10/24/2016  . Encounter for chronic pain management 10/24/2016  . Adult ADHD 04/10/2017  . Overactive bladder 01/21/2018  . PTSD (post-traumatic stress disorder) 11/12/2018  . Anxiety 11/12/2018  . Chronic low back pain 11/12/2018  . Chronic neck pain 02/28/2019  . Drug withdrawal seizure with delirium ([place]) 02/28/2019  . Elevated blood pressure reading 02/28/2019  . Gastroesophageal reflux disease 02/28/2019  . Moderate episode of recurrent major depressive disorder ([place]) 02/28/2019  . Status post laparoscopic cholecystectomy 03/23/2019  . Black stools  07/11/2019  . Leukocytes in urine 07/11/2019  . Kidney stone on right side 07/11/2019  . Left ovarian cyst 07/11/2019  . Constipation 07/11/2019   Resolved Ambulatory Problems    Diagnosis Date Noted  . Supervision of normal IUP (intrauterine pregnancy) in multigravida 11/09/2011  . Rh negative status during pregnancy 02/03/2012  . Marginal placenta previa 02/03/2012  . Pain in throat 04/18/2013  . Encounter for supervision of other normal pregnancy in first trimester 12/07/2013  . Anxiety during pregnancy in second trimester, antepartum 01/13/2014  . Active labor 06/01/2014  . Vaginal delivery 06/01/2014  . Shoulder dystocia, delivered, current hospitalization 06/01/2014  . Bilateral hand numbness 01/16/2015  . Cough 02/21/2015  . Cervical radiculopathy at C8 02/27/2015  . Otitis, externa, infective 03/15/2015  . 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 Polypharmacy 10/24/2016  . Influenza-like illness 05/13/2017  . Neck pain 11/12/2018  . Upper back pain 06/07/2019  . DDD (degenerative disc disease), cervical 06/07/2019   Past Medical History:  Diagnosis Date  . GERD (gastroesophageal reflux disease)   . Headache   . Seizure ([place]) 01/06/2019       Review of Systems See HPI.     Objective:   Physical Exam Vitals reviewed.  Constitutional:      Appearance: Normal appearance.  HENT:     Head: Normocephalic.  Cardiovascular:     Rate and Rhythm: Normal rate and regular rhythm.  Pulmonary:     Effort: Pulmonary effort is normal.  Abdominal:     Palpations: Abdomen is soft.     Tenderness: There is  abdominal tenderness. There is right CVA tenderness. There is no left CVA tenderness.     Comments: Left lower quadrant pain. No guarding or rebound.   Neurological:     General: No focal deficit present.     Mental Status: She is alert and oriented to person, place, and time.  Psychiatric:        Mood and Affect: Mood normal.           Assessment & Plan:   Marland KitchenMarland KitchenClarabella was seen today for nephrolithiasis.  Diagnoses and all orders for this visit:  Kidney stone on right side -     CBC with Differential/Platelet -     COMPLETE METABOLIC PANEL WITH GFR -     ketorolac (TORADOL) injection 60 mg -     POCT URINALYSIS DIP (CLINITEK)  Left ovarian cyst -     CBC with Differential/Platelet -     COMPLETE METABOLIC PANEL WITH GFR  Leukocytes in urine -     nitrofurantoin, macrocrystal-monohydrate, (MACROBID) 100 MG capsule; Take 1 capsule (100 mg total) by mouth 2 (two) times daily. -     CBC with Differential/Platelet -     COMPLETE METABOLIC PANEL WITH GFR -     ketorolac (TORADOL) injection 60 mg -     POCT URINALYSIS DIP (CLINITEK)  Black stools -     CBC with Differential/Platelet  Constipation, unspecified constipation type -     polyethylene glycol powder (GLYCOLAX/MIRALAX) 17 GM/SCOOP powder; Take 17 g by mouth 2 (two) times daily as needed for moderate constipation.  Mouth pain -     nystatin (MYCOSTATIN) 100000 UNIT/ML suspension; Take 5 mLs (500,000 Units total) by mouth 4 (four) times daily. Swish for 30 seconds and spit out.   .. Results for orders placed or performed in visit on 07/08/19  CBC with Differential/Platelet  Result Value Ref Range   WBC 9.5 3.8 - 10.8 Thousand/uL   RBC 4.64 3.80 - 5.10 Million/uL   Hemoglobin 13.3 11.7 - 15.5 g/dL   HCT 41.3 35.0 - 45.0 %   MCV 89.0 80.0 - 100.0 fL   MCH 28.7 27.0 - 33.0 pg   MCHC 32.2 32.0 - 36.0 g/dL   RDW 16.5 (H) 11.0 - 15.0 %   Platelets 239 140 - 400 Thousand/uL   MPV 10.3 7.5 - 12.5 fL   Neutro Abs 7,059 1,500 - 7,800 cells/uL   Lymphs Abs 1,720 850 - 3,900 cells/uL   Absolute Monocytes 637 200 - 950 cells/uL   Eosinophils Absolute 67 15 - 500 cells/uL   Basophils Absolute 19 0 - 200 cells/uL   Neutrophils Relative % 74.3 %   Total Lymphocyte 18.1 %   Monocytes Relative 6.7 %   Eosinophils Relative 0.7 %   Basophils Relative 0.2 %  COMPLETE METABOLIC PANEL WITH  GFR  Result Value Ref Range   Glucose, Bld 101 65 - 139 mg/dL   BUN 5 (L) 7 - 25 mg/dL   Creat 0.79 0.50 - 1.10 mg/dL   GFR, Est Non African American 96 > OR = 60 mL/min/1.75m2   GFR, Est African American 112 > OR = 60 mL/min/1.71m2   BUN/Creatinine Ratio 6 6 - 22 (calc)   Sodium 138 135 - 146 mmol/L   Potassium 4.0 3.5 - 5.3 mmol/L   Chloride 100 98 - 110 mmol/L   CO2 29 20 - 32 mmol/L   Calcium 9.5 8.6 - 10.2 mg/dL   Total Protein 6.6 6.1 - 8.1 g/dL  Albumin 4.6 3.6 - 5.1 g/dL   Globulin 2.0 1.9 - 3.7 g/dL (calc)   AG Ratio 2.3 1.0 - 2.5 (calc)   Total Bilirubin 0.3 0.2 - 1.2 mg/dL   Alkaline phosphatase (APISO) 42 31 - 125 U/L   AST 14 10 - 30 U/L   ALT 22 6 - 29 U/L  POCT URINALYSIS DIP (CLINITEK)  Result Value Ref Range   Color, UA yellow yellow   Clarity, UA clear clear   Glucose, UA negative negative mg/dL   Bilirubin, UA negative negative   Ketones, POC UA negative negative mg/dL   Spec Grav, UA 1.015 1.010 - 1.025   Blood, UA negative negative   pH, UA 7.5 5.0 - 8.0   POC PROTEIN,UA negative negative, trace   Urobilinogen, UA 0.2 0.2 or 1.0 E.U./dL   Nitrite, UA Negative Negative   Leukocytes, UA Moderate (2+) (A) Negative   New leukocytes in urine from 3/22.  Will culture.  Added macrobid to treat for infection.  START flomax to help pass this stone and push fluids. It is small enough to pass. If no improvement by next week will need to send to urology. She has strainer to look for stone.  Ok to use oxycodone sparingly for pain just NOT with klonapin if having a panic attack.  Will check CBC/CMP.  Given hemoocult cards to look for blood in stool. Likely black stools are due to oral iron. Ok to restart protonix.  She has been on some abx recently could be some yeast in mouth. Start nystatin mouth wash.  Hopefully left ovarian cyst will resolve on its on. Continue to monitor if still having left lower pain in 1 week consider repeat ultrasound and gyn referral.   For constipation. Use miralax twice a day with 4oz of Gatorade.   Follow up in 2 weeks or sooner if needed.

## 2019-07-11 NOTE — Progress Notes (Signed)
Lynnell,   WBC good. Hemoglobin great. You could drop down to iron once a day if taking anymore than that. Kidney, liver look great. How do you feel this am?

## 2019-07-12 MED ORDER — OXYCODONE-ACETAMINOPHEN 5-325 MG PO TABS: 1.0000 | ORAL_TABLET | Freq: Four times a day (QID) | ORAL | 0 refills | Status: AC | PRN

## 2019-07-12 NOTE — Telephone Encounter (Signed)
Pt continues to have right flank pain from kidney stone. Urology referral made. Pt request refill on pain rx. Sent oxycodone.   Marland KitchenMarland KitchenPDMP reviewed during this encounter.

## 2019-07-16 ENCOUNTER — Ambulatory Visit (INDEPENDENT_AMBULATORY_CARE_PROVIDER_SITE_OTHER): Payer: Managed Care, Other (non HMO)

## 2019-07-16 ENCOUNTER — Other Ambulatory Visit: Payer: Self-pay

## 2019-07-16 DIAGNOSIS — G8929 Other chronic pain: Secondary | ICD-10-CM

## 2019-07-16 DIAGNOSIS — M5134 Other intervertebral disc degeneration, thoracic region: Secondary | ICD-10-CM

## 2019-07-16 DIAGNOSIS — M545 Low back pain: Secondary | ICD-10-CM | POA: Diagnosis not present

## 2019-07-16 DIAGNOSIS — M542 Cervicalgia: Secondary | ICD-10-CM

## 2019-07-20 NOTE — Telephone Encounter (Signed)
I need more information, injections on what?  Has she had a COVID-19 vaccine recently?

## 2019-07-25 ENCOUNTER — Other Ambulatory Visit: Payer: Self-pay | Admitting: Physician Assistant

## 2019-07-25 DIAGNOSIS — F419 Anxiety disorder, unspecified: Secondary | ICD-10-CM

## 2019-07-25 DIAGNOSIS — F32A Depression, unspecified: Secondary | ICD-10-CM

## 2019-07-25 DIAGNOSIS — F411 Generalized anxiety disorder: Secondary | ICD-10-CM

## 2019-07-25 DIAGNOSIS — F900 Attention-deficit hyperactivity disorder, predominantly inattentive type: Secondary | ICD-10-CM

## 2019-07-25 DIAGNOSIS — G4700 Insomnia, unspecified: Secondary | ICD-10-CM

## 2019-07-25 DIAGNOSIS — F4310 Post-traumatic stress disorder, unspecified: Secondary | ICD-10-CM

## 2019-07-25 DIAGNOSIS — F430 Acute stress reaction: Secondary | ICD-10-CM

## 2019-07-25 DIAGNOSIS — F329 Major depressive disorder, single episode, unspecified: Secondary | ICD-10-CM

## 2019-07-25 DIAGNOSIS — F410 Panic disorder [episodic paroxysmal anxiety] without agoraphobia: Secondary | ICD-10-CM

## 2019-07-27 ENCOUNTER — Ambulatory Visit (INDEPENDENT_AMBULATORY_CARE_PROVIDER_SITE_OTHER): Payer: Managed Care, Other (non HMO) | Admitting: Sports Medicine

## 2019-07-27 ENCOUNTER — Other Ambulatory Visit: Payer: Self-pay

## 2019-07-27 DIAGNOSIS — M7918 Myalgia, other site: Secondary | ICD-10-CM | POA: Diagnosis not present

## 2019-07-27 DIAGNOSIS — F331 Major depressive disorder, recurrent, moderate: Secondary | ICD-10-CM

## 2019-07-27 MED ORDER — VORTIOXETINE HBR 5 MG PO TABS: 5.0000 mg | ORAL_TABLET | Freq: Every day | ORAL | 3 refills | Status: DC

## 2019-07-27 MED ORDER — GABAPENTIN 300 MG PO CAPS: ORAL_CAPSULE | ORAL | 3 refills | Status: DC

## 2019-07-27 NOTE — Assessment & Plan Note (Signed)
Annette Cook returns, she has widespread neck and back pain. Her cervical, lumbar, and thoracic spine MRIs were reviewed and show very little if any degenerative changes, there is also no central or foraminal stenosis at any level, her facet joints look great. She is a history of depression, currently not treated. We did trigger point injections with steroid at the last visit and she responded well, all of this points to myofascial pain syndrome/fibromyalgia. Today I performed #4 trigger point injections in her right paracervical, trapezial, as well as quadratus lumborum with lidocaine only. I discussed the pathophysiology of myofascial pain syndrome, we are going to start gabapentin in an up taper. Also in to start treatment of her depression, she was on Cymbalta in the past with significant sexual dysfunction so we will start Trintellix at 5 mg, further management of the depression will be with her PCP.  She can return to see me in 1 month for her myofascial pains.

## 2019-07-27 NOTE — Progress Notes (Signed)
    Procedures performed today:    Procedure:  Injection of #4 trigger points, right paracervical, right trapezius, 2 in the right quadratus lumborum. Consent obtained and verified. Time-out conducted. Noted no overlying erythema, induration, or other signs of local infection. Skin prepped in a sterile fashion. Topical analgesic spray: Ethyl chloride. Completed without difficulty. Meds: A total of 6 cc lidocaine without epinephrine used. Pain immediately improved suggesting accurate placement of the medication. Advised to call if fevers/chills, erythema, induration, drainage, or persistent bleeding.  Independent interpretation of notes and tests performed by another provider:   None.  Brief History, Exam, Impression, and Recommendations:    Diffuse myofascial pain syndrome Annette Cook returns, she has widespread neck and back pain. Her cervical, lumbar, and thoracic spine MRIs were reviewed and show very little if any degenerative changes, there is also no central or foraminal stenosis at any level, her facet joints look great. She is a history of depression, currently not treated. We did trigger point injections with steroid at the last visit and she responded well, all of this points to myofascial pain syndrome/fibromyalgia. Today I performed #4 trigger point injections in her right paracervical, trapezial, as well as quadratus lumborum with lidocaine only. I discussed the pathophysiology of myofascial pain syndrome, we are going to start gabapentin in an up taper. Also in to start treatment of her depression, she was on Cymbalta in the past with significant sexual dysfunction so we will start Trintellix at 5 mg, further management of the depression will be with her PCP.  She can return to see me in 1 month for her myofascial pains.  Moderate episode of recurrent major depressive disorder ([place]) Currently on Seroquel and Trileptal with her psychiatrist, she is hoping to contact her psych  and come down on her Seroquel dose. I will leave this alone for now, she still has some anxiety and depressive symptoms, she had sexual dysfunction with Cymbalta. Starting Trintellix 5, discount coupon given. Further follow-up of her depression with Iran Planas, PA-C. She would probably benefit most from serotonin and norepinephrine reuptake inhibition regarding her myofascial pain syndrome however the typical SNRIs will cause sexual dysfunction.    ___________________________________________ Gwen Her. Dianah Field, M.D., ABFM., CAQSM. Primary Care and [place] Instructor of [place] of [facility] of Medicine

## 2019-07-27 NOTE — Assessment & Plan Note (Signed)
Currently on Seroquel and Trileptal with her psychiatrist, she is hoping to contact her psych and come down on her Seroquel dose. I will leave this alone for now, she still has some anxiety and depressive symptoms, she had sexual dysfunction with Cymbalta. Starting Trintellix 5, discount coupon given. Further follow-up of her depression with Iran Planas, PA-C. She would probably benefit most from serotonin and norepinephrine reuptake inhibition regarding her myofascial pain syndrome however the typical SNRIs will cause sexual dysfunction.

## 2019-08-04 ENCOUNTER — Telehealth: Payer: Self-pay | Admitting: Neurology

## 2019-08-04 DIAGNOSIS — F4310 Post-traumatic stress disorder, unspecified: Secondary | ICD-10-CM

## 2019-08-04 DIAGNOSIS — F331 Major depressive disorder, recurrent, moderate: Secondary | ICD-10-CM

## 2019-08-04 DIAGNOSIS — F410 Panic disorder [episodic paroxysmal anxiety] without agoraphobia: Secondary | ICD-10-CM

## 2019-08-04 DIAGNOSIS — F419 Anxiety disorder, unspecified: Secondary | ICD-10-CM

## 2019-08-04 DIAGNOSIS — F32A Depression, unspecified: Secondary | ICD-10-CM

## 2019-08-04 DIAGNOSIS — F329 Major depressive disorder, single episode, unspecified: Secondary | ICD-10-CM

## 2019-08-04 NOTE — Telephone Encounter (Signed)
Note   ----- Message ----- From: Donella Stade, PA-C Sent: 07/25/2019   2:15 PM EDT To: Annamaria Helling, CMA Subject: FW: Referral follow up                         Will you call patient and ask about mood treatment center? Or what area she would like services for [facility]?  ----- Message ----- From: Marjorie Smolder Sent: 07/25/2019   2:03 PM EDT To: Donella Stade, PA-C Subject: Referral follow up                             Patient declined services.  Patient wants to receive services with an agency that is closer to her home.  Writer informed the referring provider through in-basket in epic.  Referral will be closed.      Spoke with patient and she states she would like referral to Dr. Erling Cruz in [facility]. Referral placed.

## 2019-08-10 ENCOUNTER — Telehealth: Payer: Self-pay | Admitting: *Deleted

## 2019-08-10 NOTE — Telephone Encounter (Signed)
Returned call from patient to schedule annual appointment, left patient a message to call and schedule.

## 2019-08-12 ENCOUNTER — Other Ambulatory Visit: Payer: Self-pay

## 2019-08-12 ENCOUNTER — Encounter: Payer: Self-pay | Admitting: Sports Medicine

## 2019-08-12 ENCOUNTER — Other Ambulatory Visit: Payer: Self-pay | Admitting: Neurology

## 2019-08-12 ENCOUNTER — Ambulatory Visit (INDEPENDENT_AMBULATORY_CARE_PROVIDER_SITE_OTHER): Payer: Managed Care, Other (non HMO) | Admitting: Sports Medicine

## 2019-08-12 DIAGNOSIS — M545 Low back pain, unspecified: Secondary | ICD-10-CM

## 2019-08-12 DIAGNOSIS — F900 Attention-deficit hyperactivity disorder, predominantly inattentive type: Secondary | ICD-10-CM | POA: Diagnosis not present

## 2019-08-12 DIAGNOSIS — M7918 Myalgia, other site: Secondary | ICD-10-CM | POA: Diagnosis not present

## 2019-08-12 DIAGNOSIS — G8929 Other chronic pain: Secondary | ICD-10-CM

## 2019-08-12 MED ORDER — PREDNISONE 50 MG PO TABS: ORAL_TABLET | ORAL | 0 refills | Status: DC

## 2019-08-12 MED ORDER — KETOROLAC TROMETHAMINE 60 MG/2ML IM SOLN
60.0000 mg | Freq: Once | INTRAMUSCULAR | Status: AC
  Administered 2019-08-12: 60 mg via INTRAMUSCULAR

## 2019-08-12 MED ORDER — ATOMOXETINE HCL 40 MG PO CAPS: 40.0000 mg | ORAL_CAPSULE | Freq: Every day | ORAL | 3 refills | Status: DC

## 2019-08-12 MED ORDER — GABAPENTIN 600 MG PO TABS: ORAL_TABLET | ORAL | 3 refills | Status: DC

## 2019-08-12 NOTE — Progress Notes (Signed)
Patient requested referral to pain management per [facility]. Referral entered.

## 2019-08-12 NOTE — Assessment & Plan Note (Signed)
Psychiatry discontinued her Adderall due to increased anxiety and switched to Strattera. I am going to go ahead and increase her Strattera dose from 25 mg to 40 mg. The SNRI effect will help her depression and her myofascial pain.

## 2019-08-12 NOTE — Assessment & Plan Note (Signed)
This is a pleasant 34 year old female, she has chronic widespread neck and back pain, more recently having a flare. We obtained cervical, thoracic, and lumbar spine MRIs which were essentially normal. No evidence of central or foraminal stenosis at any level and her facet joints look great. She also had a history of depression, she was on Trintellix but this was discontinued as she was placed on Strattera by her psychiatrist, she was historically on Adderall but this was discontinued due to an increase in anxiety. Trigger point injections at the last visit helped to some degree. Gabapentin has helped a lot. She is not noting any drowsiness with 300 mg twice daily so we will increase to 600 mg 3 times daily for gabapentin. I am also going to give her 60 of Toradol intramuscular, 5 days of prednisone. Strattera is an SNRI, so we will increase the dose of this as treating her depression will be crucial to resolving her pain. Of note we also switched her PT to our facility where she can get dry needling.

## 2019-08-12 NOTE — Progress Notes (Signed)
    Procedures performed today:    None.  Independent interpretation of notes and tests performed by another provider:   None.  Brief History, Exam, Impression, and Recommendations:    Myofascial pain syndrome This is a pleasant 34 year old female, she has chronic widespread neck and back pain, more recently having a flare. We obtained cervical, thoracic, and lumbar spine MRIs which were essentially normal. No evidence of central or foraminal stenosis at any level and her facet joints look great. She also had a history of depression, she was on Trintellix but this was discontinued as she was placed on Strattera by her psychiatrist, she was historically on Adderall but this was discontinued due to an increase in anxiety. Trigger point injections at the last visit helped to some degree. Gabapentin has helped a lot. She is not noting any drowsiness with 300 mg twice daily so we will increase to 600 mg 3 times daily for gabapentin. I am also going to give her 60 of Toradol intramuscular, 5 days of prednisone. Strattera is an SNRI, so we will increase the dose of this as treating her depression will be crucial to resolving her pain. Of note we also switched her PT to our facility where she can get dry needling.  ADHD (attention deficit hyperactivity disorder) Psychiatry discontinued her Adderall due to increased anxiety and switched to Strattera. I am going to go ahead and increase her Strattera dose from 25 mg to 40 mg. The SNRI effect will help her depression and her myofascial pain.    ___________________________________________ Gwen Her. Dianah Field, M.D., ABFM., CAQSM. Primary Care and [place] Instructor of [place] of [facility] of Medicine

## 2019-08-17 DIAGNOSIS — H9311 Tinnitus, right ear: Secondary | ICD-10-CM | POA: Insufficient documentation

## 2019-08-18 ENCOUNTER — Ambulatory Visit: Payer: Managed Care, Other (non HMO) | Admitting: Medical-Surgical

## 2019-08-26 DIAGNOSIS — M7918 Myalgia, other site: Secondary | ICD-10-CM

## 2019-08-29 NOTE — Telephone Encounter (Signed)
That was the issue, I asked her if she wanted the referral but maybe she did not respond, placing a referral now to PT for dry needling.

## 2019-09-04 ENCOUNTER — Other Ambulatory Visit: Payer: Self-pay | Admitting: Sports Medicine

## 2019-09-04 DIAGNOSIS — F900 Attention-deficit hyperactivity disorder, predominantly inattentive type: Secondary | ICD-10-CM

## 2019-09-06 ENCOUNTER — Ambulatory Visit: Payer: Managed Care, Other (non HMO) | Admitting: Physical Therapy

## 2019-09-08 ENCOUNTER — Ambulatory Visit: Payer: Managed Care, Other (non HMO) | Admitting: Sports Medicine

## 2019-09-08 ENCOUNTER — Telehealth (INDEPENDENT_AMBULATORY_CARE_PROVIDER_SITE_OTHER): Payer: Managed Care, Other (non HMO) | Admitting: Sports Medicine

## 2019-09-08 DIAGNOSIS — M7918 Myalgia, other site: Secondary | ICD-10-CM

## 2019-09-08 DIAGNOSIS — F900 Attention-deficit hyperactivity disorder, predominantly inattentive type: Secondary | ICD-10-CM

## 2019-09-08 MED ORDER — ATOMOXETINE HCL 60 MG PO CAPS: 60.0000 mg | ORAL_CAPSULE | Freq: Every day | ORAL | 3 refills | Status: DC

## 2019-09-08 NOTE — Assessment & Plan Note (Addendum)
This is a pleasant 34 year old female with myofascial pain syndrome, she had normal cervical, thoracic, and lumbar spine MRIs. Trigger point injections have helped to some degree in the past and can be utilized in the future if needed. We also added physical therapy with dry needling at our facility. Currently we were up tapering her on gabapentin, I also increased her Strattera to help both her depression and pain symptoms. Unfortunately she is a bit too groggy on 600 mg of gabapentin 3 times daily so we are going to drop it back down to 300 mg twice daily. I am also going to increase her Strattera again to 60 mg daily, max is 100. She does endorse that she is starting to notice an increase in depression, and I did inform her that would be very difficult to control her pain symptoms until we could get her depression under control as well. I would like to see her back in 4-6 weeks to reevaluate.

## 2019-09-08 NOTE — Progress Notes (Signed)
   Virtual Visit via WebEx/MyChart   I connected with  Annette Cook  on 09/08/19 via WebEx/MyChart/Doximity Video and verified that I am speaking with the correct person using two identifiers.   I discussed the limitations, risks, security and privacy concerns of performing an evaluation and management service by WebEx/MyChart/Doximity Video, including the higher likelihood of inaccurate diagnosis and treatment, and the availability of in person appointments.  We also discussed the likely need of an additional face to face encounter for complete and high quality delivery of care.  I also discussed with the patient that there may be a patient responsible charge related to this service. The patient expressed understanding and wishes to proceed.  Provider location is either at home or medical facility. Patient location is at their home, different from provider location. People involved in care of the patient during this telehealth encounter were myself, my nurse/medical assistant, and my front office/scheduling team member.  Review of Systems: No fevers, chills, night sweats, weight loss, chest pain, or shortness of breath.   Objective Findings:    General: Speaking full sentences, no audible heavy breathing.  Sounds alert and appropriately interactive.  Appears well.  Face symmetric.  Extraocular movements intact.  Pupils equal and round.  No nasal flaring or accessory muscle use visualized.  Independent interpretation of tests performed by another provider:   None.  Brief History, Exam, Impression, and Recommendations:    Myofascial pain syndrome This is a pleasant 34 year old female with myofascial pain syndrome, she had normal cervical, thoracic, and lumbar spine MRIs. Trigger point injections have helped to some degree in the past and can be utilized in the future if needed. We also added physical therapy with dry needling at our facility. Currently we were up tapering her on  gabapentin, I also increased her Strattera to help both her depression and pain symptoms. Unfortunately she is a bit too groggy on 600 mg of gabapentin 3 times daily so we are going to drop it back down to 300 mg twice daily. I am also going to increase her Strattera again to 60 mg daily, max is 100. She does endorse that she is starting to notice an increase in depression, and I did inform her that would be very difficult to control her pain symptoms until we could get her depression under control as well. I would like to see her back in 4-6 weeks to reevaluate.   I discussed the above assessment and treatment plan with the patient. The patient was provided an opportunity to ask questions and all were answered. The patient agreed with the plan and demonstrated an understanding of the instructions.   The patient was advised to call back or seek an in-person evaluation if the symptoms worsen or if the condition fails to improve as anticipated.   I provided 30 minutes of face to face and non-face-to-face time during this encounter date, time was needed to gather information, review chart, records, communicate/coordinate with staff remotely, as well as complete documentation.   ___________________________________________ Gwen Her. Dianah Field, M.D., ABFM., CAQSM. Primary Care and [place] Instructor of [place] of [facility] of Medicine

## 2019-09-08 NOTE — Progress Notes (Deleted)
   Virtual Visit via WebEx/MyChart   I connected with  Annette Cook  on 09/08/19 via WebEx/MyChart/Doximity Video and verified that I am speaking with the correct person using two identifiers.   I discussed the limitations, risks, security and privacy concerns of performing an evaluation and management service by WebEx/MyChart/Doximity Video, including the higher likelihood of inaccurate diagnosis and treatment, and the availability of in person appointments.  We also discussed the likely need of an additional face to face encounter for complete and high quality delivery of care.  I also discussed with the patient that there may be a patient responsible charge related to this service. The patient expressed understanding and wishes to proceed.  Provider location is either at home or medical facility. Patient location is at their home, different from provider location. People involved in care of the patient during this telehealth encounter were myself, my nurse/medical assistant, and my front office/scheduling team member.  Review of Systems: No fevers, chills, night sweats, weight loss, chest pain, or shortness of breath.   Objective Findings:    General: Speaking full sentences, no audible heavy breathing.  Sounds alert and appropriately interactive.  Appears well.  Face symmetric.  Extraocular movements intact.  Pupils equal and round.  No nasal flaring or accessory muscle use visualized.  Independent interpretation of tests performed by another provider:   None.  Brief History, Exam, Impression, and Recommendations:    Myofascial pain syndrome This is a pleasant 34 year old female with myofascial pain syndrome, she had normal cervical, thoracic, and lumbar spine MRIs. Trigger point injections have helped to some degree in the past and can be utilized in the future if needed. We also added physical therapy with dry needling at our facility. Currently we were up tapering her on  gabapentin, I also increased her Strattera to help both her depression and pain symptoms. Unfortunately she is a bit too groggy on 600 mg of gabapentin 3 times daily so we are going to drop it back down to 300 mg twice daily. I am also going to increase her Strattera again to 60 mg daily, max is 100. She does endorse that she is starting to notice an increase in depression, and I did inform her that would be very difficult to control her pain symptoms until we could get her depression under control as well. I would like to see her back in 4-6 weeks to reevaluate.   I discussed the above assessment and treatment plan with the patient. The patient was provided an opportunity to ask questions and all were answered. The patient agreed with the plan and demonstrated an understanding of the instructions.   The patient was advised to call back or seek an in-person evaluation if the symptoms worsen or if the condition fails to improve as anticipated.   I provided 30 minutes of face to face and non-face-to-face time during this encounter date, time was needed to gather information, review chart, records, communicate/coordinate with staff remotely, as well as complete documentation.   ___________________________________________ Gwen Her. Dianah Field, M.D., ABFM., CAQSM. Primary Care and [place] In[employer] of [employer] [facility]

## 2019-09-09 ENCOUNTER — Ambulatory Visit: Payer: Managed Care, Other (non HMO) | Admitting: Sports Medicine

## 2019-09-09 ENCOUNTER — Ambulatory Visit: Payer: Managed Care, Other (non HMO) | Admitting: Family Medicine

## 2019-09-13 ENCOUNTER — Ambulatory Visit: Payer: Managed Care, Other (non HMO) | Admitting: Family Medicine

## 2019-09-14 NOTE — Telephone Encounter (Signed)
Can we schedule a virtual for Friday?  Ok to [place]

## 2019-09-14 NOTE — Telephone Encounter (Signed)
Perfect

## 2019-09-14 NOTE — Telephone Encounter (Signed)
I made her an appt for Friday at 11:30 and I tried to call but no answer or voice mail. I'll call again to try to make sure that works with her just so the spot doesn't get taken since it opened up.

## 2019-09-16 ENCOUNTER — Telehealth: Payer: Managed Care, Other (non HMO) | Admitting: Sports Medicine

## 2019-09-16 ENCOUNTER — Other Ambulatory Visit: Payer: Self-pay

## 2019-09-16 ENCOUNTER — Encounter: Payer: Self-pay | Admitting: Physician Assistant

## 2019-09-16 ENCOUNTER — Telehealth (INDEPENDENT_AMBULATORY_CARE_PROVIDER_SITE_OTHER): Payer: Managed Care, Other (non HMO) | Admitting: Physician Assistant

## 2019-09-16 DIAGNOSIS — F19231 Other psychoactive substance dependence with withdrawal delirium: Secondary | ICD-10-CM | POA: Diagnosis not present

## 2019-09-16 DIAGNOSIS — B009 Herpesviral infection, unspecified: Secondary | ICD-10-CM

## 2019-09-16 DIAGNOSIS — F410 Panic disorder [episodic paroxysmal anxiety] without agoraphobia: Secondary | ICD-10-CM

## 2019-09-16 DIAGNOSIS — F909 Attention-deficit hyperactivity disorder, unspecified type: Secondary | ICD-10-CM

## 2019-09-16 DIAGNOSIS — M7918 Myalgia, other site: Secondary | ICD-10-CM

## 2019-09-16 DIAGNOSIS — R569 Unspecified convulsions: Secondary | ICD-10-CM

## 2019-09-16 DIAGNOSIS — K219 Gastro-esophageal reflux disease without esophagitis: Secondary | ICD-10-CM

## 2019-09-16 DIAGNOSIS — F331 Major depressive disorder, recurrent, moderate: Secondary | ICD-10-CM

## 2019-09-16 DIAGNOSIS — Z79899 Other long term (current) drug therapy: Secondary | ICD-10-CM | POA: Diagnosis not present

## 2019-09-16 DIAGNOSIS — F411 Generalized anxiety disorder: Secondary | ICD-10-CM

## 2019-09-16 MED ORDER — VALACYCLOVIR HCL 500 MG PO TABS: 500.0000 mg | ORAL_TABLET | Freq: Every day | ORAL | 3 refills | Status: DC

## 2019-09-16 NOTE — Progress Notes (Signed)
Patient ID: Annette Cook, female   DOB: 12-26-1985, 34 y.o.   MRN: [id number] .Marland KitchenVirtual Visit via Telephone Note  I connected with Annette Cook on 09/16/19 at 11:30 AM EDT by telephone and verified that I am speaking with the correct person using two identifiers.  Location: Patient: car Provider: clinic   I discussed the limitations, risks, security and privacy concerns of performing an evaluation and management service by telephone and the availability of in person appointments. I also discussed with the patient that there may be a patient responsible charge related to this service. The patient expressed understanding and agreed to proceed.   History of Present Illness: Pt is a 34 yo female who calls into the clinic to discuss med list and who will prescribe each med. Her psychiatrist wants clear boundaries.   Pt needs suppressive HSV valltrex prescription.   She is doing fairly well. No major concerns or complaints.   .. Active Ambulatory Problems    Diagnosis Date Noted  . 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 Generalized anxiety disorder 10/22/2012  . Insomnia 10/22/2012  . Panic attack 10/22/2012  . ADHD (attention deficit hyperactivity disorder) 10/22/2012  . Acute stress reaction 07/08/2013  . 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 Takes daily multivitamins 09/12/2016  . Opioid use agreement exists 10/24/2016  . Encounter for chronic pain management 10/24/2016  . Adult ADHD 04/10/2017  . Overactive bladder 01/21/2018  . PTSD (post-traumatic stress disorder) 11/12/2018  . Anxiety 11/12/2018  . Myofascial pain syndrome 11/12/2018  . Drug withdrawal seizure with delirium ([place]) 02/28/2019  . Elevated blood pressure reading 02/28/2019  . Gastroesophageal reflux disease 02/28/2019  . Moderate episode of recurrent major depressive disorder ([place]) 02/28/2019  . Status post laparoscopic cholecystectomy 03/23/2019  .  Black stools 07/11/2019  . Leukocytes in urine 07/11/2019  . Kidney stone on right side 07/11/2019  . Left ovarian cyst 07/11/2019  . Constipation 07/11/2019   Resolved Ambulatory Problems    Diagnosis Date Noted  . Supervision of normal IUP (intrauterine pregnancy) in multigravida 11/09/2011  . Rh negative status during pregnancy 02/03/2012  . Marginal placenta previa 02/03/2012  . Pain in throat 04/18/2013  . Encounter for supervision of other normal pregnancy in first trimester 12/07/2013  . Anxiety during pregnancy in second trimester, antepartum 01/13/2014  . Active labor 06/01/2014  . Vaginal delivery 06/01/2014  . Shoulder dystocia, delivered, current hospitalization 06/01/2014  . Bilateral hand numbness 01/16/2015  . Cough 02/21/2015  . Cervical radiculopathy at C8 02/27/2015  . Otitis, externa, infective 03/15/2015  . Anxiety and depression 09/03/2015  . 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 Polypharmacy 10/24/2016  . Influenza-like illness 05/13/2017  . Neck pain 11/12/2018  . Chronic neck pain 02/28/2019  . Upper back pain 06/07/2019  . DDD (degenerative disc disease), cervical 06/07/2019   Past Medical History:  Diagnosis Date  . GERD (gastroesophageal reflux disease)   . Headache   . Seizure ([place]) 01/06/2019   Reviewed med, allergy, problem list.     Observations/Objective: No acute distress. Normal mood.  [name].There were no vitals filed for this visit. There is no height or weight on file to calculate BMI.   Assessment and Plan: Marland KitchenMarland KitchenJeryn was seen today for follow-up.  Diagnoses and all orders for this visit:  Medication management  Drug withdrawal seizure with delirium ([place])  Gastroesophageal reflux disease, unspecified whether esophagitis present  Myofascial pain syndrome  Adult ADHD  Generalized anxiety  disorder  Panic attack  Moderate episode of recurrent major depressive disorder (HCC)  HSV infection -     valACYclovir (VALTREX) 500 MG  tablet; Take 1 tablet (500 mg total) by mouth daily.  To whom it may concern,   The above patient is seen in our clinic for primary care and sports medicine. This letter is to establish who will prescribe her medications.   Behavioral Health: -Strattera -klonapin -hydroxizine -seroquel   Neurologist: -Trileptal  Sports Medicine(Dr. T and Iran Planas) -gabapentin -zanaflex -zofran -iron -Valtrex  Valtrex refilled for one year.   [email or web address] Letter emailed.    Follow Up Instructions:    I discussed the assessment and treatment plan with the patient. The patient was provided an opportunity to ask questions and all were answered. The patient agreed with the plan and demonstrated an understanding of the instructions.   The patient was advised to call back or seek an in-person evaluation if the symptoms worsen or if the condition fails to improve as anticipated.  I provided 25 minutes of non-face-to-face time during this encounter.   Iran Planas, PA-C

## 2019-09-16 NOTE — Progress Notes (Signed)
Letter was emailed

## 2019-10-03 ENCOUNTER — Encounter: Payer: Managed Care, Other (non HMO) | Admitting: Obstetrics & Gynecology

## 2019-10-04 ENCOUNTER — Other Ambulatory Visit: Payer: Self-pay

## 2019-10-04 DIAGNOSIS — R1030 Lower abdominal pain, unspecified: Secondary | ICD-10-CM

## 2019-10-04 DIAGNOSIS — M545 Low back pain, unspecified: Secondary | ICD-10-CM

## 2019-10-04 MED ORDER — TAMSULOSIN HCL 0.4 MG PO CAPS: 0.4000 mg | ORAL_CAPSULE | Freq: Every day | ORAL | 3 refills | Status: DC

## 2019-10-09 ENCOUNTER — Other Ambulatory Visit: Payer: Self-pay | Admitting: Physician Assistant

## 2019-10-09 DIAGNOSIS — F411 Generalized anxiety disorder: Secondary | ICD-10-CM

## 2019-10-10 ENCOUNTER — Other Ambulatory Visit: Payer: Self-pay | Admitting: Physician Assistant

## 2019-10-10 DIAGNOSIS — Z79899 Other long term (current) drug therapy: Secondary | ICD-10-CM

## 2019-10-13 ENCOUNTER — Other Ambulatory Visit: Payer: Self-pay

## 2019-10-13 ENCOUNTER — Ambulatory Visit (INDEPENDENT_AMBULATORY_CARE_PROVIDER_SITE_OTHER): Payer: Managed Care, Other (non HMO) | Admitting: Rehabilitative and Restorative Service Providers"

## 2019-10-13 DIAGNOSIS — R29898 Other symptoms and signs involving the musculoskeletal system: Secondary | ICD-10-CM | POA: Diagnosis not present

## 2019-10-13 DIAGNOSIS — M542 Cervicalgia: Secondary | ICD-10-CM

## 2019-10-13 DIAGNOSIS — R293 Abnormal posture: Secondary | ICD-10-CM | POA: Diagnosis not present

## 2019-10-13 DIAGNOSIS — M6281 Muscle weakness (generalized): Secondary | ICD-10-CM
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
## 2019-10-13 NOTE — Patient Instructions (Signed)
Access Code: [email or web address] URL: [email or web address] Date: 10/13/2019 Prepared by: Rudell Cobb  Exercises Supine Thoracic Mobilization Towel Roll Vertical with Arm Stretch - 2 x daily - 7 x weekly - 1 sets - 1 reps - 2 minutes hold Sidelying Thoracic Rotation with Open Book - 2 x daily - 7 x weekly - 1 sets - 5 reps

## 2019-10-27 ENCOUNTER — Other Ambulatory Visit (HOSPITAL_COMMUNITY)
Admission: RE | Admit: 2019-10-27 | Discharge: 2019-10-27 | Disposition: A | Discharge status: Home / Self Care | Payer: Managed Care, Other (non HMO) | Source: Ambulatory Visit | Attending: Obstetrics and Gynecology | Admitting: Obstetrics and Gynecology

## 2019-10-27 ENCOUNTER — Ambulatory Visit (INDEPENDENT_AMBULATORY_CARE_PROVIDER_SITE_OTHER): Payer: Managed Care, Other (non HMO) | Admitting: Obstetrics and Gynecology

## 2019-10-27 ENCOUNTER — Encounter: Payer: Self-pay | Admitting: Obstetrics and Gynecology

## 2019-10-27 ENCOUNTER — Other Ambulatory Visit: Payer: Self-pay

## 2019-10-27 VITALS — BP 115/79 | HR 89 | Ht 60.0 in | Wt 119.0 lb

## 2019-10-27 DIAGNOSIS — Z87442 Personal history of urinary calculi: Secondary | ICD-10-CM | POA: Diagnosis not present

## 2019-10-27 DIAGNOSIS — R1031 Right lower quadrant pain: Secondary | ICD-10-CM

## 2019-10-27 DIAGNOSIS — Z01419 Encounter for gynecological examination (general) (routine) without abnormal findings: Secondary | ICD-10-CM | POA: Insufficient documentation

## 2019-10-27 DIAGNOSIS — G8929 Other chronic pain: Secondary | ICD-10-CM

## 2019-10-27 NOTE — Progress Notes (Signed)
Subjective:     Annette Cook is a 34 y.o. female P3 with LMP 10/26/19 and BMI 23 who is here for a comprehensive physical exam. The patient reports intermittent RLQ pain. The pain seems to be located towards her back. She has a history of kidney stones. Patient reports a monthly 5 day period. She is sexually active using vasectomy for contraception.  Past Medical History:  Diagnosis Date   ADHD (attention deficit hyperactivity disorder)    Anxiety    GERD (gastroesophageal reflux disease)    Headache    Insomnia    Seizure ([place]) 01/06/2019   last Oct 2020 with weeks of memory loss   Past Surgical History:  Procedure Laterality Date   APPENDECTOMY     BILIARY STENT PLACEMENT  03/24/2019   Procedure: BILIARY STENT PLACEMENT;  Surgeon: Clarene Essex, MD;  Location: [place];  Service: Endoscopy;;   CHOLECYSTECTOMY N/A 03/17/2019   Procedure: LAPAROSCOPIC CHOLECYSTECTOMY;  Surgeon: Ralene Ok, MD;  Location: [place];  Service: General;  Laterality: N/A;   ERCP N/A 03/24/2019   Procedure: ENDOSCOPIC RETROGRADE CHOLANGIOPANCREATOGRAPHY (ERCP);  Surgeon: Clarene Essex, MD;  Location: [place];  Service: Endoscopy;  Laterality: N/A;   ERCP N/A 05/17/2019   Procedure: ENDOSCOPIC RETROGRADE CHOLANGIOPANCREATOGRAPHY (ERCP);  Surgeon: Clarene Essex, MD;  Location: [facility];  Service: Endoscopy;  Laterality: N/A;  with stent removal   SPHINCTEROTOMY  03/24/2019   Procedure: SPHINCTEROTOMY;  Surgeon: Clarene Essex, MD;  [facility] END[facility];  Service: Endoscopy;;   STENT REMOVAL  05/17/2019   Procedure: STENT REMOVAL;  Surgeon: Clarene Essex, MD;  Location: [facility] ENDOSCOPY;  Service: Endoscopy;;   TONSILLECTOMY AND ADENOIDECTOMY     WISDOM TOOTH EXTRACTION     Family History  Problem Relation Age of Onset   Hypertension Mother    Stroke Mother     Social History   Socioeconomic History   Marital status: Married    Spouse name: Not on file   Number of children: Not on  file   Years of education: Not on file   Highest education level: Not on file  Occupational History   Occupation: front Therapist, sports  Tobacco Use   Smoking status: Former Smoker    Packs/day: 1.00    Years: 7.00    Pack years: 7.00    Types: Cigarettes    Quit date: 10/22/2004    Years since quitting: 15.0   Smokeless tobacco: Never Used  Vaping Use   Vaping Use: Every day   Substances: Nicotine   Devices: Started 3 years ago  Substance and Sexual Activity   Alcohol use: Yes    Comment: socially   Drug use: No   Sexual activity: Yes    Partners: Male    Birth control/protection: None    Comment: Husband had vasectomy  Other Topics Concern   Not on file  Social History Narrative   Marital Status: Married Barrister's clerk)    Children:  Son Nurse, learning disability) Daughter (Remi)    Pets: None    Living Situation: Lives with husband, step-daughter & children.    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      Alcohol Use:  None   Drug Use:  None   Diet:  Regular   Exercise:  None   Hobbies: Fishing [employer])          Social Determinants  of Health   Financial Resource Strain:    Difficulty of Paying Living Expenses:   Food Insecurity:    Worried About Charity fundraiser in the Last Year:    Arboriculturist in the Last Year:   Transportation Needs:    Film/video editor (Medical):    Lack of Transportation (Non-Medical):   Physical Activity:    Days of Exercise per Week:    Minutes of Exercise per Session:   Stress:    Feeling of Stress :   Social Connections:    Frequency of Communication with Friends and Family:    Frequency of Social Gatherings with Friends and Family:    Attends Religious Services:    Active Member of Clubs or Organizations:    Attends Archivist Meetings:    Marital Status:   Intimate  Partner Violence:    Fear of Current or Ex-Partner:    Emotionally Abused:    Physically Abused:    Sexually Abused:    Health Maintenance  Topic Date Due   Hepatitis C Screening  Never done   PAP SMEAR-Modifier  11/12/2019 (Originally 07/13/2017)   COVID-19 Vaccine (1) 09/15/2020 (Originally 07/05/1995)   INFLUENZA VACCINE  11/13/2019   TETANUS/TDAP  03/17/2024   HIV Screening  Completed       Review of Systems Pertinent items noted in HPI and remainder of comprehensive ROS otherwise negative.   Objective:  Blood pressure 115/79, pulse 89, height 5' (1.524 m), weight 119 lb (54 kg), last menstrual period 10/26/2019.     GENERAL: Well-developed, well-nourished female in no acute distress.  HEENT: Normocephalic, atraumatic. Sclerae anicteric.  NECK: Supple. Normal thyroid.  LUNGS: Clear to auscultation bilaterally.  HEART: Regular rate and rhythm. BREASTS: Symmetric in size. No palpable masses or lymphadenopathy, skin changes, or nipple drainage. ABDOMEN: Soft, nontender, nondistended. No organomegaly. PELVIC: Normal external female genitalia. Vagina is pink and rugated.  Normal discharge. Normal appearing cervix. Uterus is normal in size. No adnexal mass or tenderness. EXTREMITIES: No cyanosis, clubbing, or edema, 2+ distal pulses.    Assessment:    Healthy female exam.      Plan:    pap smear collected Wet prep collected Pelvic ultrasound ordered Patient will be contacted with abnormal results See After Visit Summary for Counseling Recommendations

## 2019-10-27 NOTE — Progress Notes (Signed)
Pt c/o fatigue and RLQ pain

## 2019-10-28 ENCOUNTER — Ambulatory Visit (HOSPITAL_BASED_OUTPATIENT_CLINIC_OR_DEPARTMENT_OTHER): Payer: Managed Care, Other (non HMO)

## 2019-10-28 LAB — CERVICOVAGINAL ANCILLARY ONLY
Bacterial Vaginitis (gardnerella): NEGATIVE
Candida Glabrata: NEGATIVE
Candida Vaginitis: POSITIVE — AB
Comment: NEGATIVE
Comment: NEGATIVE
Comment: NEGATIVE
Comment: NEGATIVE
Trichomonas: NEGATIVE

## 2019-10-31 ENCOUNTER — Telehealth: Payer: Self-pay | Admitting: *Deleted

## 2019-10-31 MED ORDER — FLUCONAZOLE 150 MG PO TABS: 150.0000 mg | ORAL_TABLET | Freq: Once | ORAL | 0 refills | Status: AC

## 2019-10-31 MED ORDER — FLUCONAZOLE 150 MG PO TABS
150.0000 mg | ORAL_TABLET | Freq: Once | ORAL | 0 refills | Status: AC
Start: 2019-10-31 — End: 2019-10-31

## 2019-10-31 NOTE — Telephone Encounter (Signed)
Pt tested positive for vaginal yeast and RX for Diflucan sent to her pharmacy.

## 2019-10-31 NOTE — Addendum Note (Signed)
Addended by: Mora Bellman on: 10/31/2019 08:59 AM   Modules accepted: Orders

## 2019-11-01 ENCOUNTER — Ambulatory Visit (HOSPITAL_BASED_OUTPATIENT_CLINIC_OR_DEPARTMENT_OTHER): Payer: Managed Care, Other (non HMO) | Attending: Obstetrics and Gynecology

## 2019-11-02 ENCOUNTER — Other Ambulatory Visit (HOSPITAL_BASED_OUTPATIENT_CLINIC_OR_DEPARTMENT_OTHER): Payer: Managed Care, Other (non HMO)

## 2019-11-03 LAB — CYTOLOGY - PAP
Comment: NEGATIVE
Diagnosis: NEGATIVE
High risk HPV: NEGATIVE

## 2019-11-08 ENCOUNTER — Encounter (INDEPENDENT_AMBULATORY_CARE_PROVIDER_SITE_OTHER): Payer: Managed Care, Other (non HMO)

## 2019-11-08 DIAGNOSIS — F900 Attention-deficit hyperactivity disorder, predominantly inattentive type: Secondary | ICD-10-CM

## 2019-11-13 ENCOUNTER — Other Ambulatory Visit: Payer: Self-pay

## 2019-11-13 ENCOUNTER — Encounter (HOSPITAL_BASED_OUTPATIENT_CLINIC_OR_DEPARTMENT_OTHER): Payer: Self-pay | Admitting: Emergency Medicine

## 2019-11-13 ENCOUNTER — Emergency Department (HOSPITAL_BASED_OUTPATIENT_CLINIC_OR_DEPARTMENT_OTHER): Payer: Managed Care, Other (non HMO)

## 2019-11-13 ENCOUNTER — Emergency Department (HOSPITAL_BASED_OUTPATIENT_CLINIC_OR_DEPARTMENT_OTHER)
Admission: EM | Admit: 2019-11-13 | Discharge: 2019-11-13 | Disposition: A | Discharge status: Home / Self Care | Payer: Managed Care, Other (non HMO) | Attending: Emergency Medicine | Admitting: Emergency Medicine

## 2019-11-13 DIAGNOSIS — S60211A Contusion of right wrist, initial encounter: Secondary | ICD-10-CM | POA: Insufficient documentation

## 2019-11-13 DIAGNOSIS — Y939 Activity, unspecified: Secondary | ICD-10-CM | POA: Diagnosis not present

## 2019-11-13 DIAGNOSIS — F909 Attention-deficit hyperactivity disorder, unspecified type: Secondary | ICD-10-CM | POA: Insufficient documentation

## 2019-11-13 DIAGNOSIS — W19XXXA Unspecified fall, initial encounter: Secondary | ICD-10-CM | POA: Insufficient documentation

## 2019-11-13 DIAGNOSIS — Y929 Unspecified place or not applicable: Secondary | ICD-10-CM | POA: Insufficient documentation

## 2019-11-13 DIAGNOSIS — T148XXA Other injury of unspecified body region, initial encounter: Secondary | ICD-10-CM

## 2019-11-13 DIAGNOSIS — M542 Cervicalgia: Secondary | ICD-10-CM | POA: Diagnosis not present

## 2019-11-13 DIAGNOSIS — R519 Headache, unspecified: Secondary | ICD-10-CM | POA: Insufficient documentation

## 2019-11-13 DIAGNOSIS — Z87891 Personal history of nicotine dependence: Secondary | ICD-10-CM | POA: Diagnosis not present

## 2019-11-13 DIAGNOSIS — S7001XA Contusion of right hip, initial encounter: Secondary | ICD-10-CM | POA: Insufficient documentation

## 2019-11-13 DIAGNOSIS — Y999 Unspecified external cause status: Secondary | ICD-10-CM | POA: Diagnosis not present

## 2019-11-13 MED ORDER — KETOROLAC TROMETHAMINE 60 MG/2ML IM SOLN
60.0000 mg | Freq: Once | INTRAMUSCULAR | Status: AC
  Administered 2019-11-13: 60 mg via INTRAMUSCULAR
  Filled 2019-11-13: qty 2

## 2019-11-13 NOTE — Discharge Instructions (Signed)
All the x-rays today were normal without any sign of broken bones.  You were just bruised and have muscle strain.  Continue to take the Tylenol and ibuprofen.  Also you can take her Zanaflex at night to help with stiffness in the morning.

## 2019-11-13 NOTE — ED Provider Notes (Signed)
[facility] EMERGENCY DEPARTMENT Provider Note   CSN: [id number] Arrival date & time: 11/13/19  0546     History Chief Complaint  Patient presents with   Annette Cook    ALEILA SYVERSON is a 34 y.o. female.  The history is provided by the patient.  Fall This is a new problem. The current episode started yesterday. The problem occurs constantly. The problem has been gradually worsening. Associated symptoms comments: Right hip and buttocks pain, right wrist pain, neck pain and headache. Patient reports she was rollerskating yesterday and she fell on the right side. It hurt initially but it was much worse when she woke up this morning.. The symptoms are aggravated by walking and bending. The symptoms are relieved by NSAIDs and acetaminophen. Treatments tried: Acetaminophen and ibuprofen. The treatment provided mild relief.       Past Medical History:  Diagnosis Date   ADHD (attention deficit hyperactivity disorder)    Anxiety    GERD (gastroesophageal reflux disease)    Headache    Insomnia    Seizure ([place]) 01/06/2019   last Oct 2020 with weeks of memory loss    Patient Active Problem List   Diagnosis Date Noted   Black stools 07/11/2019   Leukocytes in urine 07/11/2019   Kidney stone on right side 07/11/2019   Left ovarian cyst 07/11/2019   Constipation 07/11/2019   Status post laparoscopic cholecystectomy 03/23/2019   Drug withdrawal seizure with delirium ([place]) 02/28/2019   Elevated blood pressure reading 02/28/2019   Gastroesophageal reflux disease 02/28/2019   Moderate episode of recurrent major depressive disorder ([place]) 02/28/2019   PTSD (post-traumatic stress disorder) 11/12/2018   Anxiety 11/12/2018   Myofascial pain syndrome 11/12/2018   Overactive bladder 01/21/2018   Adult ADHD 04/10/2017   Opioid use agreement exists 10/24/2016   Encounter for chronic pain management 10/24/2016   Takes daily multivitamins 09/12/2016    Patellofemoral pain syndrome 04/12/2015   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 03/15/2015   Acute stress reaction 07/08/2013   Generalized anxiety disorder 10/22/2012   Insomnia 10/22/2012   Panic attack 10/22/2012   ADHD (attention deficit hyperactivity disorder) 10/22/2012   HSV infection 12/02/2011    Past Surgical History:  Procedure Laterality Date   APPENDECTOMY     BILIARY STENT PLACEMENT  03/24/2019   Procedure: BILIARY STENT PLACEMENT;  Surgeon: Clarene Essex, MD;  Location: [place];  Service: Endoscopy;;   CHOLECYSTECTOMY N/A 03/17/2019   Procedure: LAPAROSCOPIC CHOLECYSTECTOMY;  Surgeon: Ralene Ok, MD;  Location: [place];  Service: General;  Laterality: N/A;   ERCP N/A 03/24/2019   Procedure: ENDOSCOPIC RETROGRADE CHOLANGIOPANCREATOGRAPHY (ERCP);  Surgeon: Clarene Essex, MD;  Location: [place];  Service: Endoscopy;  Laterality: N/A;   ERCP N/A 05/17/2019   Procedure: ENDOSCOPIC RETROGRADE CHOLANGIOPANCREATOGRAPHY (ERCP);  Surgeon: Clarene Essex, MD;  Location: [facility];  Service: Endoscopy;  Laterality: N/A;  with stent removal   SPHINCTEROTOMY  03/24/2019   Procedure: SPHINCTEROTOMY;  Surgeon: Clarene Essex, MD;  Location: [facility] ENDOSCOPY;  Service: Endoscopy;;   STENT REMOVAL  05/17/2019   Procedure: Lavell Islam REMOVAL;  Surgeon: Clarene Essex, MD;  Location: [facility] ENDOSCOPY;  Service: Endoscopy;;   TONSILLECTOMY AND ADENOIDECTOMY     WISDOM TOOTH EXTRACTION       OB History    Gravida  3   Para  3   Term  3   Preterm      AB      Living  3     SAB  TAB      Ectopic      Multiple  0   Live Births  3           Family History  Problem Relation Age of Onset   Hypertension Mother    Stroke Mother     Social History   Tobacco Use   Smoking status: Former Smoker    Packs/day: 1.00    Years: 7.00    Pack years: 7.00    Types: Cigarettes    Quit date: 10/22/2004    Years since quitting: 15.0   Smokeless  tobacco: Never Used  Vaping Use   Vaping Use: Every day   Substances: Nicotine   Devices: Started 3 years ago  Substance Use Topics   Alcohol use: Yes    Comment: socially   Drug use: No    Home Medications Prior to Admission medications   Medication Sig Start Date End Date Taking? Authorizing Provider  atomoxetine (STRATTERA) 60 MG capsule Take 1 capsule (60 mg total) by mouth daily. 09/08/19   Silverio Decamp, MD  clonazePAM (KLONOPIN) 0.5 MG tablet Take as needed for panic attacks no more than once a day. 07/01/19   Breeback, Jade L, PA-C  ferrous sulfate (FER-IN-SOL) 75 (15 Fe) MG/ML SOLN Take by mouth.    [provider]  gabapentin (NEURONTIN) 300 MG capsule Take 1 capsule (300 mg total) by mouth 2 (two) times daily. 09/08/19   Silverio Decamp, MD  hydrOXYzine (ATARAX/VISTARIL) 50 MG tablet 1 tablet in the morning, 2 in the evening 10/10/19   Breeback, Jade L, PA-C  ibuprofen (ADVIL) 800 MG tablet TAKE 1 TABLET BY MOUTH EVERY 8 HOURS AS NEEDED 10/10/19   Breeback, Jade L, PA-C  magnesium oxide (MAG-OX) 400 MG tablet Take 400 mg by mouth daily.    [provider]  Melatonin 5 MG CAPS Take 5 mg by mouth at bedtime.    [provider]  ondansetron (ZOFRAN-ODT) 8 MG disintegrating tablet Take 1 tablet (8 mg total) by mouth every 8 (eight) hours as needed for nausea. 07/04/19   Orma Render, NP  Oxcarbazepine (TRILEPTAL) 300 MG tablet Take 300 mg by mouth 2 (two) times daily. 02/11/19   [provider]  pantoprazole (PROTONIX) 40 MG tablet Take 40 mg by mouth daily. Takes every other day    [provider]  QUEtiapine (SEROQUEL) 50 MG tablet Take by mouth. 50 mg in the morning, 150 mg in the evening    [provider]  tamsulosin (FLOMAX) 0.4 MG CAPS capsule Take 1 capsule (0.4 mg total) by mouth daily. 10/04/19   Orma Render, NP  tiZANidine (ZANAFLEX) 2 MG tablet Take 4 mg by mouth at bedtime.  03/14/19   [provider]  Turmeric 500 MG CAPS Take by mouth.    [provider]  valACYclovir (VALTREX) 500 MG tablet Take 1 tablet (500 mg total) by mouth daily. 09/16/19   Donella Stade, PA-C    Allergies    Patient has no known allergies.  Review of Systems   Review of Systems  All other systems reviewed and are negative.   Physical Exam Updated Vital Signs BP 107/76 (BP Location: Right Arm)    Pulse 81    Temp 98.3 F (36.8 C) (Oral)    Resp 17    Ht 5' (1.524 m)    Wt 51.7 kg    LMP 10/26/2019    SpO2 100%  BMI 22.26 kg/m   Physical Exam Vitals and nursing note reviewed.  Constitutional:      General: She is not in acute distress.    Appearance: Normal appearance. She is well-developed and normal weight.  HENT:     Head: Normocephalic and atraumatic.  Eyes:     Pupils: Pupils are equal, round, and reactive to light.  Neck:   Cardiovascular:     Rate and Rhythm: Normal rate and regular rhythm.     Heart sounds: Normal heart sounds. No murmur heard.  No friction rub.  Pulmonary:     Effort: Pulmonary effort is normal.     Breath sounds: Normal breath sounds. No wheezing or rales.  Abdominal:     General: Bowel sounds are normal. There is no distension.     Palpations: Abdomen is soft.     Tenderness: There is no abdominal tenderness. There is no guarding or rebound.  Musculoskeletal:        General: Tenderness present. Normal range of motion.     Right wrist: Tenderness and bony tenderness present. No snuff box tenderness.       Hands:     Cervical back: Normal range of motion and neck supple. No rigidity. Muscular tenderness present. No spinous process tenderness.     Right hip: Tenderness and bony tenderness present. Normal range of motion.       Legs:     Comments: No edema  Skin:    General: Skin is warm and dry.     Findings: No rash.  Neurological:     General: No focal deficit present.     Mental Status: She is alert and oriented to person, place,  and time. Mental status is at baseline.     Cranial Nerves: No cranial nerve deficit.  Psychiatric:        Mood and Affect: Mood normal.        Behavior: Behavior normal.        Thought Content: Thought content normal.      ED Results / Procedures / Treatments   Labs (all labs ordered are listed, but only abnormal results are displayed) Labs Reviewed - No data to display  EKG None  Radiology DG Elbow Complete Right  Result Date: 11/13/2019 CLINICAL DATA:  34 year old who fell while roller skating yesterday, complaining of RIGHT wrist pain, RIGHT lateral hip pain, and RIGHT elbow pain. Initial encounter. Personal history of surgery for ulnar nerve impingement. EXAM: RIGHT ELBOW - COMPLETE 3+ VIEW COMPARISON:  None. FINDINGS: No evidence of acute fracture or dislocation. Well-preserved joint spaces. Well-preserved bone mineral density. No intrinsic osseous abnormality. Calcification at the insertion of the triceps tendon on the olecranon. IMPRESSION: 1. No osseous abnormality. 2. Chronic calcific triceps tendonitis/enthesopathy. Electronically Signed   By: Evangeline Dakin M.D.   On: 11/13/2019 08:46   DG Wrist Complete Right  Result Date: 11/13/2019 CLINICAL DATA:  34 year old who fell while roller skating yesterday, complaining of RIGHT wrist pain, RIGHT lateral hip pain, and RIGHT elbow pain. Initial encounter. Personal history of surgery for ulnar nerve impingement. EXAM: RIGHT WRIST - COMPLETE 3+ VIEW COMPARISON:  None. FINDINGS: No evidence of acute fracture or dislocation. Joint spaces well preserved. Well-preserved bone mineral density. No intrinsic osseous abnormalities. IMPRESSION: Normal examination. Electronically Signed   By: Evangeline Dakin M.D.   On: 11/13/2019 08:47   DG Hip Unilat W or Wo Pelvis 2-3 Views Right  Result Date: 11/13/2019 CLINICAL DATA:  34 year old who fell while  roller skating yesterday, complaining of RIGHT wrist pain, RIGHT lateral hip pain, and RIGHT elbow  pain. Initial encounter. Personal history of surgery for ulnar nerve impingement. EXAM: DG HIP (WITH OR WITHOUT PELVIS) 2-3V RIGHT COMPARISON:  None. FINDINGS: No evidence of acute fracture or dislocation. Well preserved joint space. Well preserved bone mineral density. No intrinsic osseous abnormality. Included AP pelvis demonstrates a normal-appearing contralateral LEFT hip. Sacroiliac joints and symphysis pubis anatomically aligned without significant degenerative changes or diastasis. Visualized lower lumbar spine unremarkable. IMPRESSION: Normal examination. Electronically Signed   By: Evangeline Dakin M.D.   On: 11/13/2019 08:48    Procedures Procedures (including critical care time)  Medications Ordered in ED Medications  ketorolac (TORADOL) injection 60 mg (60 mg Intramuscular Given 11/13/19 0347)    ED Course  I have reviewed the triage vital signs and the nursing notes.  Pertinent labs & imaging results that were available during my care of the patient were reviewed by me and considered in my medical decision making (see chart for details).    MDM Rules/Calculators/A&P                          Patient reports that she fell while skating last night. She has pain over the right hip, right wrist and some stiffness in her neck with a headache. She denies hitting her head or loss of consciousness. Suspect that the neck pain is related to cervical strain from her head whipping forward. She has no neurologic symptoms and is otherwise neurologically intact. Low suspicion for cervical spine injury. Patient's x-ray of her right elbow, wrist and hip are all negative for acute fracture. She does have chronic findings of the right elbow with a known prior surgery for ulnar nerve impingement. Suspect this is all contusion and strain related to her recent fall. Encouraged using her tizanidine that she already had at home as well as Tylenol and ibuprofen.  MDM Number of Diagnoses or Management  Options   Amount and/or Complexity of Data Reviewed Tests in the radiology section of CPT: ordered and reviewed Independent visualization of images, tracings, or specimens: yes  Risk of Complications, Morbidity, and/or Mortality Presenting problems: moderate Diagnostic procedures: low Management options: low  Patient Progress Patient progress: stable  Final Clinical Impression(s) / ED Diagnoses Final diagnoses:  Fall, initial encounter  Contusion of right hip, initial encounter  Contusion of right wrist, initial encounter  Muscle strain    Rx / DC Orders ED Discharge Orders    None       Blanchie Dessert, MD 11/13/19 1025

## 2019-11-13 NOTE — ED Triage Notes (Signed)
Pt states she fell at a kids birthday party yesterday ~1800 while on skates. C/o pain to buttocks and R wrist. States she has had increased pain since then. No swelling noted to wrist. Pt states she took Tylenol and Ibuprofen with some relief.

## 2019-11-14 MED ORDER — ATOMOXETINE HCL 80 MG PO CAPS: 80.0000 mg | ORAL_CAPSULE | Freq: Every day | ORAL | 3 refills | Status: DC

## 2019-11-17 ENCOUNTER — Encounter: Payer: Self-pay | Admitting: Physical Therapy

## 2019-11-17 ENCOUNTER — Ambulatory Visit: Payer: Managed Care, Other (non HMO) | Attending: Sports Medicine | Admitting: Physical Therapy

## 2019-11-17 ENCOUNTER — Other Ambulatory Visit: Payer: Self-pay | Admitting: Neurology

## 2019-11-17 ENCOUNTER — Other Ambulatory Visit: Payer: Self-pay

## 2019-11-17 DIAGNOSIS — M6281 Muscle weakness (generalized): Secondary | ICD-10-CM | POA: Diagnosis present

## 2019-11-17 DIAGNOSIS — M542 Cervicalgia: Secondary | ICD-10-CM | POA: Diagnosis present

## 2019-11-17 DIAGNOSIS — R29898 Other symptoms and signs involving the musculoskeletal system: Secondary | ICD-10-CM | POA: Insufficient documentation

## 2019-11-17 DIAGNOSIS — R293 Abnormal posture: Secondary | ICD-10-CM | POA: Insufficient documentation

## 2019-11-17 MED ORDER — PANTOPRAZOLE SODIUM 40 MG PO TBEC: 40.0000 mg | DELAYED_RELEASE_TABLET | Freq: Every day | ORAL | 1 refills | Status: DC
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
## 2019-11-17 NOTE — Patient Instructions (Addendum)

## 2019-11-24 ENCOUNTER — Ambulatory Visit: Payer: Managed Care, Other (non HMO) | Admitting: Physical Therapy

## 2019-11-24 ENCOUNTER — Encounter: Payer: Self-pay | Admitting: Physical Therapy

## 2019-11-24 ENCOUNTER — Other Ambulatory Visit: Payer: Self-pay

## 2019-11-24 DIAGNOSIS — R293 Abnormal posture: Secondary | ICD-10-CM

## 2019-11-24 DIAGNOSIS — M6281 Muscle weakness (generalized): Secondary | ICD-10-CM

## 2019-11-24 DIAGNOSIS — M542 Cervicalgia: Secondary | ICD-10-CM | POA: Diagnosis not present

## 2019-11-24 DIAGNOSIS — R29898 Other symptoms and signs involving the musculoskeletal system: Secondary | ICD-10-CM
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
## 2019-11-24 NOTE — Patient Instructions (Signed)
    Home exercise program created by Shenay Torti, PT.  For questions, please contact Taleigha Pinson via phone at [phone] or email at [email or web address]  [facility] [place] [address]  [address] [place], [place], [address] Phone: [phone]   Fax:  [phone]    

## 2019-11-25 ENCOUNTER — Other Ambulatory Visit: Payer: Self-pay

## 2019-11-25 ENCOUNTER — Encounter: Payer: Self-pay | Admitting: Physician Assistant

## 2019-11-25 DIAGNOSIS — R110 Nausea: Secondary | ICD-10-CM

## 2019-11-25 MED ORDER — ONDANSETRON 8 MG PO TBDP: 8.0000 mg | ORAL_TABLET | Freq: Three times a day (TID) | ORAL | 3 refills | Status: DC | PRN

## 2019-11-25 NOTE — Telephone Encounter (Signed)
Rx written by historical provider. Rx pended.  

## 2019-11-28 ENCOUNTER — Ambulatory Visit: Payer: Managed Care, Other (non HMO) | Admitting: Obstetrics & Gynecology

## 2019-11-28 ENCOUNTER — Encounter: Payer: Self-pay | Admitting: Physician Assistant

## 2019-11-28 ENCOUNTER — Telehealth (INDEPENDENT_AMBULATORY_CARE_PROVIDER_SITE_OTHER): Payer: Managed Care, Other (non HMO) | Admitting: Physician Assistant

## 2019-11-28 VITALS — Temp 98.2°F | Ht 60.0 in | Wt 114.0 lb

## 2019-11-28 DIAGNOSIS — R062 Wheezing: Secondary | ICD-10-CM

## 2019-11-28 DIAGNOSIS — J40 Bronchitis, not specified as acute or chronic: Secondary | ICD-10-CM

## 2019-11-28 DIAGNOSIS — Z20822 Contact with and (suspected) exposure to covid-19: Secondary | ICD-10-CM

## 2019-11-28 DIAGNOSIS — R059 Cough, unspecified: Secondary | ICD-10-CM

## 2019-11-28 DIAGNOSIS — R52 Pain, unspecified: Secondary | ICD-10-CM | POA: Diagnosis not present

## 2019-11-28 DIAGNOSIS — R0602 Shortness of breath: Secondary | ICD-10-CM

## 2019-11-28 DIAGNOSIS — R05 Cough: Secondary | ICD-10-CM

## 2019-11-28 DIAGNOSIS — J329 Chronic sinusitis, unspecified: Secondary | ICD-10-CM

## 2019-11-28 DIAGNOSIS — U071 COVID-19: Secondary | ICD-10-CM

## 2019-11-28 DIAGNOSIS — J029 Acute pharyngitis, unspecified: Secondary | ICD-10-CM

## 2019-11-28 MED ORDER — HYDROCOD POLST-CPM POLST ER 10-8 MG/5ML PO SUER: 5.0000 mL | Freq: Two times a day (BID) | ORAL | 0 refills | Status: DC | PRN

## 2019-11-28 MED ORDER — TIZANIDINE HCL 2 MG PO TABS: 4.0000 mg | ORAL_TABLET | Freq: Every day | ORAL | 1 refills | Status: DC

## 2019-11-28 MED ORDER — TIZANIDINE HCL 4 MG PO TABS: 4.0000 mg | ORAL_TABLET | Freq: Four times a day (QID) | ORAL | 0 refills | Status: DC | PRN

## 2019-11-28 MED ORDER — ALBUTEROL SULFATE HFA 108 (90 BASE) MCG/ACT IN AERS: 2.0000 | INHALATION_SPRAY | Freq: Four times a day (QID) | RESPIRATORY_TRACT | 0 refills | Status: DC | PRN

## 2019-11-28 MED ORDER — AZITHROMYCIN 250 MG PO TABS: ORAL_TABLET | ORAL | 0 refills | Status: DC

## 2019-11-28 NOTE — Progress Notes (Signed)
Patient ID: Annette Cook, female   DOB: 1985/11/11, 34 y.o.   MRN: [id number] .Marland KitchenVirtual Visit via Telephone Note  I connected with Annette Cook on 11/29/19 at  4:40 PM EDT by telephone and verified that I am speaking with the correct person using two identifiers.  Location: Patient: home Provider: clinic   I discussed the limitations, risks, security and privacy concerns of performing an evaluation and management service by telephone and the availability of in person appointments. I also discussed with the patient that there may be a patient responsible charge related to this service. The patient expressed understanding and agreed to proceed.   History of Present Illness: Patient is a 34 year old female who calls into the clinic with Covid exposure and Covid symptoms.  Her husband and all 3 children have symptoms but not all of them tested positive for Covid.  Her symptoms started about 4 days ago.  She is having tremendous body aches, cough, shortness of breath, wheezing.  She is having a lot of sinus pressure and feels like her head is going to explode.  She is blowing out a lot of green-yellow sputum.  She does have a pulse ox and is ranging 97 to 98%.  Her husband qualified for infusion and is seem to help him feel better faster.  Her cough is keeping her up at night.  She feels very weak and tired.  She is able to drink some.  She just wanted to make sure she was doing everything she could to get better faster.  She does question ivermectin therapy for Covid.   Active Ambulatory Problems    Diagnosis Date Noted  . 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 Generalized anxiety disorder 10/22/2012  . Insomnia 10/22/2012  . Panic attack 10/22/2012  . ADHD (attention deficit hyperactivity disorder) 10/22/2012  . Acute stress reaction 07/08/2013  . 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 Takes daily multivitamins 09/12/2016  . Opioid  use agreement exists 10/24/2016  . Encounter for chronic pain management 10/24/2016  . Adult ADHD 04/10/2017  . Overactive bladder 01/21/2018  . PTSD (post-traumatic stress disorder) 11/12/2018  . Anxiety 11/12/2018  . Myofascial pain syndrome 11/12/2018  . Drug withdrawal seizure with delirium ([place]) 02/28/2019  . Elevated blood pressure reading 02/28/2019  . Gastroesophageal reflux disease 02/28/2019  . Moderate episode of recurrent major depressive disorder ([place]) 02/28/2019  . Status post laparoscopic cholecystectomy 03/23/2019  . Black stools 07/11/2019  . Leukocytes in urine 07/11/2019  . Kidney stone on right side 07/11/2019  . Left ovarian cyst 07/11/2019  . Constipation 07/11/2019   Resolved Ambulatory Problems    Diagnosis Date Noted  . Supervision of normal IUP (intrauterine pregnancy) in multigravida 11/09/2011  . Rh negative status during pregnancy 02/03/2012  . Marginal placenta previa 02/03/2012  . Pain in throat 04/18/2013  . Encounter for supervision of other normal pregnancy in first trimester 12/07/2013  . Anxiety during pregnancy in second trimester, antepartum 01/13/2014  . Active labor 06/01/2014  . Vaginal delivery 06/01/2014  . Shoulder dystocia, delivered, current hospitalization 06/01/2014  . Bilateral hand numbness 01/16/2015  . Cough 02/21/2015  . Cervical radiculopathy at C8 02/27/2015  . Otitis, externa, infective 03/15/2015  . Anxiety and depression 09/03/2015  . 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 Polypharmacy 10/24/2016  . Influenza-like illness 05/13/2017  . Neck pain 11/12/2018  . Chronic neck pain 02/28/2019  . Upper back pain 06/07/2019  . DDD (degenerative  disc disease), cervical 06/07/2019   Past Medical History:  Diagnosis Date  . GERD (gastroesophageal reflux disease)   . Headache   . Seizure ([place]) 01/06/2019   Reviewed med, allergy, problem list.   Observations/Objective: No acute distress Productive cough.  Normal  mood. Sounded congested and fatigued.      Assessment and Plan:  Marland KitchenMarland KitchenEvadne was seen today for covid exposure.  Diagnoses and all orders for this visit:  Close exposure to COVID-19 virus -     azithromycin (ZITHROMAX Z-PAK) 250 MG tablet; Take 2 tablets (500 mg) on  Day 1,  followed by 1 tablet (250 mg) once daily on Days 2 through 5.  Cough -     azithromycin (ZITHROMAX Z-PAK) 250 MG tablet; Take 2 tablets (500 mg) on  Day 1,  followed by 1 tablet (250 mg) once daily on Days 2 through 5. -     chlorpheniramine-HYDROcodone (TUSSIONEX) 10-8 MG/5ML SUER; Take 5 mLs by mouth every 12 (twelve) hours as needed.  Wheezing  Body aches -     tiZANidine (ZANAFLEX) 4 MG tablet; Take 1 tablet (4 mg total) by mouth every 6 (six) hours as needed for muscle spasms.  Sore throat  Shortness of breath -     albuterol (VENTOLIN HFA) 108 (90 Base) MCG/ACT inhaler; Inhale 2 puffs into the lungs every 6 (six) hours as needed.  Sinobronchitis -     azithromycin (ZITHROMAX Z-PAK) 250 MG tablet; Take 2 tablets (500 mg) on  Day 1,  followed by 1 tablet (250 mg) once daily on Days 2 through 5.   Likely Covid due to whole family symptomatic and most testing positive. Quarantine 10 days from symptom start.  Discussed with patient how ivermectin therapy has not been clinically proven to help with Covid symptoms.  Continue to monitor pulse ox and goal above 92 percent. Sent albuterol inhaler. Discussed symptomatic care with ibuprofen, hydration, mucinex, tussionex at night. Refilled zanaflex for muscle tightness she has been using and helping. She does not qualify for infusion therapy. Start zinc. I did treat with zpak due to concern for pneumonia and how long her symptoms have lasted. Go to ED with any sudden worsening of breathing.    Follow Up Instructions:    I discussed the assessment and treatment plan with the patient. The patient was provided an opportunity to ask questions and all were answered. The  patient agreed with the plan and demonstrated an understanding of the instructions.   The patient was advised to call back or seek an in-person evaluation if the symptoms worsen or if the condition fails to improve as anticipated.  I provided  10 minutes of non-face-to-face time during this encounter.   Iran Planas, PA-C

## 2019-11-29 ENCOUNTER — Other Ambulatory Visit: Payer: Self-pay | Admitting: Physician Assistant

## 2019-11-29 MED ORDER — AMOXICILLIN-POT CLAVULANATE 875-125 MG PO TABS
1.0000 | ORAL_TABLET | Freq: Two times a day (BID) | ORAL | 0 refills | Status: AC
Start: 2019-11-29 — End: 2019-12-09

## 2019-11-29 NOTE — Progress Notes (Signed)
augmentin to replace zpak due to dizzy spell and potential QT prolongation.

## 2019-11-30 ENCOUNTER — Ambulatory Visit: Payer: Managed Care, Other (non HMO) | Admitting: Physical Therapy

## 2019-11-30 ENCOUNTER — Telehealth: Payer: Managed Care, Other (non HMO) | Admitting: Physician Assistant

## 2019-12-02 ENCOUNTER — Ambulatory Visit: Payer: Managed Care, Other (non HMO) | Admitting: Physical Therapy

## 2019-12-05 ENCOUNTER — Ambulatory Visit: Payer: Managed Care, Other (non HMO) | Admitting: Physical Therapy

## 2019-12-07 ENCOUNTER — Ambulatory Visit: Payer: Managed Care, Other (non HMO) | Admitting: Physical Therapy

## 2019-12-10 ENCOUNTER — Other Ambulatory Visit: Payer: Self-pay | Admitting: Sports Medicine

## 2019-12-10 DIAGNOSIS — F900 Attention-deficit hyperactivity disorder, predominantly inattentive type: Secondary | ICD-10-CM

## 2019-12-11 ENCOUNTER — Other Ambulatory Visit: Payer: Self-pay | Admitting: Physician Assistant

## 2019-12-11 DIAGNOSIS — Z79899 Other long term (current) drug therapy: Secondary | ICD-10-CM

## 2019-12-12 ENCOUNTER — Other Ambulatory Visit: Payer: Self-pay

## 2019-12-12 ENCOUNTER — Encounter: Payer: Self-pay | Admitting: Physical Therapy

## 2019-12-12 ENCOUNTER — Ambulatory Visit: Payer: Managed Care, Other (non HMO) | Admitting: Physical Therapy

## 2019-12-12 DIAGNOSIS — R29898 Other symptoms and signs involving the musculoskeletal system: Secondary | ICD-10-CM

## 2019-12-12 DIAGNOSIS — R293 Abnormal posture: Secondary | ICD-10-CM

## 2019-12-12 DIAGNOSIS — M542 Cervicalgia: Secondary | ICD-10-CM | POA: Diagnosis not present

## 2019-12-12 DIAGNOSIS — M6281 Muscle weakness (generalized): Secondary | ICD-10-CM

## 2019-12-12 NOTE — Telephone Encounter (Signed)
Pretty sure patient gets through [facility] now. Can you confirm?

## 2019-12-12 NOTE — Telephone Encounter (Signed)
Confirmed with patient she is getting this through [facility]. Please sign denial.
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
## 2019-12-12 NOTE — Telephone Encounter (Signed)
To PCP

## 2019-12-12 NOTE — Patient Instructions (Signed)
    Home exercise program created by [name], PT.  For questions, please contact [name] via phone at [phone] or email at [name]  [facility] [place] [address]  [address] [place], [place], [address] Phone: [phone]   Fax:  [phone]    

## 2019-12-13 ENCOUNTER — Other Ambulatory Visit: Payer: Self-pay | Admitting: Physician Assistant

## 2019-12-13 DIAGNOSIS — R059 Cough, unspecified: Secondary | ICD-10-CM

## 2019-12-14 ENCOUNTER — Ambulatory Visit: Payer: Managed Care, Other (non HMO) | Admitting: Physical Therapy

## 2019-12-14 MED ORDER — HYDROCOD POLST-CPM POLST ER 10-8 MG/5ML PO SUER: 5.0000 mL | Freq: Two times a day (BID) | ORAL | 0 refills | Status: DC | PRN

## 2019-12-14 NOTE — Telephone Encounter (Signed)
Contacted pt - she did request a refill for the medication. She is still having episodes of coughing. Requesting provider to approve refill for the medication.

## 2019-12-14 NOTE — Telephone Encounter (Signed)
Is she still coughing?

## 2019-12-21 ENCOUNTER — Ambulatory Visit: Payer: Managed Care, Other (non HMO) | Admitting: Physical Therapy

## 2019-12-23 ENCOUNTER — Ambulatory Visit: Payer: Managed Care, Other (non HMO) | Admitting: Physical Therapy

## 2019-12-26 ENCOUNTER — Ambulatory Visit: Payer: Managed Care, Other (non HMO) | Attending: Sports Medicine | Admitting: Physical Therapy

## 2019-12-28 ENCOUNTER — Ambulatory Visit: Payer: Managed Care, Other (non HMO) | Admitting: Physical Therapy

## 2020-01-02 ENCOUNTER — Ambulatory Visit: Payer: Managed Care, Other (non HMO) | Admitting: Physical Therapy

## 2020-01-03 ENCOUNTER — Other Ambulatory Visit: Payer: Self-pay | Admitting: Physician Assistant

## 2020-01-03 DIAGNOSIS — F411 Generalized anxiety disorder: Secondary | ICD-10-CM

## 2020-01-03 NOTE — Telephone Encounter (Signed)
BH

## 2020-01-03 NOTE — Telephone Encounter (Signed)
Last written 10/10/2019 Should this be filled by you or psychiatry?

## 2020-01-04 ENCOUNTER — Other Ambulatory Visit: Payer: Self-pay | Admitting: Nurse Practitioner

## 2020-01-04 ENCOUNTER — Ambulatory Visit: Payer: Managed Care, Other (non HMO) | Admitting: Physical Therapy

## 2020-01-04 DIAGNOSIS — M545 Low back pain, unspecified: Secondary | ICD-10-CM

## 2020-01-04 DIAGNOSIS — R1030 Lower abdominal pain, unspecified: Secondary | ICD-10-CM

## 2020-01-09 ENCOUNTER — Encounter: Payer: Self-pay | Admitting: Physician Assistant

## 2020-01-09 ENCOUNTER — Ambulatory Visit: Payer: Managed Care, Other (non HMO) | Admitting: Physical Therapy

## 2020-01-09 DIAGNOSIS — K915 Postcholecystectomy syndrome: Secondary | ICD-10-CM

## 2020-01-11 ENCOUNTER — Ambulatory Visit: Payer: Managed Care, Other (non HMO) | Admitting: Physical Therapy

## 2020-01-16 NOTE — Addendum Note (Signed)
Addended byDonella Stade on: 01/16/2020 12:08 PM   Modules accepted: Orders

## 2020-01-18 ENCOUNTER — Other Ambulatory Visit: Payer: Self-pay | Admitting: Physician Assistant

## 2020-01-18 DIAGNOSIS — F411 Generalized anxiety disorder: Secondary | ICD-10-CM

## 2020-01-25 ENCOUNTER — Encounter: Payer: Self-pay | Admitting: Gastroenterology

## 2020-01-31 ENCOUNTER — Other Ambulatory Visit: Payer: Self-pay | Admitting: Physician Assistant

## 2020-01-31 ENCOUNTER — Encounter: Payer: Self-pay | Admitting: Physician Assistant

## 2020-01-31 DIAGNOSIS — F410 Panic disorder [episodic paroxysmal anxiety] without agoraphobia: Secondary | ICD-10-CM

## 2020-02-01 NOTE — Telephone Encounter (Signed)
Last written 07/01/19 for #10 no RF  Last medication management appt 09/16/19

## 2020-02-03 ENCOUNTER — Telehealth (INDEPENDENT_AMBULATORY_CARE_PROVIDER_SITE_OTHER): Payer: Managed Care, Other (non HMO) | Admitting: Physician Assistant

## 2020-02-03 ENCOUNTER — Encounter: Payer: Self-pay | Admitting: Physician Assistant

## 2020-02-03 VITALS — BP 116/79 | HR 96 | Temp 98.0°F | Ht 60.0 in | Wt 105.0 lb

## 2020-02-03 DIAGNOSIS — K921 Melena: Secondary | ICD-10-CM

## 2020-02-03 DIAGNOSIS — K219 Gastro-esophageal reflux disease without esophagitis: Secondary | ICD-10-CM

## 2020-02-03 DIAGNOSIS — R110 Nausea: Secondary | ICD-10-CM | POA: Diagnosis not present

## 2020-02-03 DIAGNOSIS — R5383 Other fatigue: Secondary | ICD-10-CM

## 2020-02-03 DIAGNOSIS — F419 Anxiety disorder, unspecified: Secondary | ICD-10-CM | POA: Diagnosis not present

## 2020-02-03 DIAGNOSIS — R0982 Postnasal drip: Secondary | ICD-10-CM

## 2020-02-03 DIAGNOSIS — J302 Other seasonal allergic rhinitis: Secondary | ICD-10-CM

## 2020-02-03 DIAGNOSIS — R253 Fasciculation: Secondary | ICD-10-CM | POA: Diagnosis not present

## 2020-02-03 MED ORDER — ONDANSETRON 8 MG PO TBDP: 8.0000 mg | ORAL_TABLET | Freq: Three times a day (TID) | ORAL | 3 refills | Status: DC | PRN

## 2020-02-03 MED ORDER — TIZANIDINE HCL 4 MG PO CAPS: 4.0000 mg | ORAL_CAPSULE | Freq: Three times a day (TID) | ORAL | 1 refills | Status: DC | PRN

## 2020-02-03 MED ORDER — PANTOPRAZOLE SODIUM 40 MG PO TBEC: 40.0000 mg | DELAYED_RELEASE_TABLET | Freq: Two times a day (BID) | ORAL | 1 refills | Status: DC

## 2020-02-03 MED ORDER — LEVOCETIRIZINE DIHYDROCHLORIDE 5 MG PO TABS: 5.0000 mg | ORAL_TABLET | Freq: Every evening | ORAL | 1 refills | Status: DC

## 2020-02-03 MED ORDER — FLUTICASONE PROPIONATE 50 MCG/ACT NA SUSP: 2.0000 | Freq: Every day | NASAL | 5 refills | Status: DC

## 2020-02-03 NOTE — Progress Notes (Signed)
Issues with allergies x 3 weeks Congestion No other symptoms Taking Claritin 10 mg - helps some days Wants refills of Zofran No other issues

## 2020-02-03 NOTE — Progress Notes (Signed)
..Virtual Visit via Telephone Note  I connected with Annette Cook on 02/03/2020 at 11:30 AM EDT by telephone and verified that I am speaking with the correct person using two identifiers.  Location: Patient: home Provider: clinic   I discussed the limitations, risks, security and privacy concerns of performing an evaluation and management service by telephone and the availability of in person appointments. I also discussed with the patient that there may be a patient responsible charge related to this service. The patient expressed understanding and agreed to proceed.   History of Present Illness: Patient is a 34 year old female with seasonal allergies, GERD who presents to the clinic to discuss ongoing cough and postnasal drip.  She does feel like her acid reflux is worsening.  She is taking Protonix once a day and feels like it is not enough.  She also has some postnasal drip and throat clearing that is going on.  Her cough is not productive but rather associated with her throat clearing.  She denies any abdominal pain.  She denies any hematochezia or melena.  She denies any ear pain, sinus pressure.  She does intermittently feel nasal congestion.  She is taking Claritin and Delsym daily.  She feels like the combination is making her feel a little loopy.  Not on any new medications. Nausea is intermittent throughout the day.   She does need refills on muscle relaxer. She takes at night to help her sleep and with muscle twitching.   Her anxiety continues to be a big problem.  She is seeing behavioral health for medication adjustments.  She feels like her life is overall going pretty well but she cannot escape the daily anxious symptoms and feelings that she is presented with.  She is aware she has so many triggers that escalate her into panic attacks.  She is taking Klonopin fairly regularly.  She is compliant with her medications.  .. Active Ambulatory Problems    Diagnosis Date Noted  . HSV  infection 12/02/2011  . Generalized anxiety disorder 10/22/2012  . Insomnia 10/22/2012  . Panic attack 10/22/2012  . ADHD (attention deficit hyperactivity disorder) 10/22/2012  . Acute stress reaction 07/08/2013  . 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 Takes daily multivitamins 09/12/2016  . Opioid use agreement exists 10/24/2016  . Encounter for chronic pain management 10/24/2016  . Adult ADHD 04/10/2017  . Overactive bladder 01/21/2018  . PTSD (post-traumatic stress disorder) 11/12/2018  . Anxiety 11/12/2018  . Myofascial pain syndrome 11/12/2018  . Drug withdrawal seizure with delirium ([place]) 02/28/2019  . Elevated blood pressure reading 02/28/2019  . Gastroesophageal reflux disease 02/28/2019  . Moderate episode of recurrent major depressive disorder ([place]) 02/28/2019  . Status post laparoscopic cholecystectomy 03/23/2019  . Black stools 07/11/2019  . Leukocytes in urine 07/11/2019  . Kidney stone on right side 07/11/2019  . Left ovarian cyst 07/11/2019  . Constipation 07/11/2019   Resolved Ambulatory Problems    Diagnosis Date Noted  . Supervision of normal IUP (intrauterine pregnancy) in multigravida 11/09/2011  . Rh negative status during pregnancy 02/03/2012  . Marginal placenta previa 02/03/2012  . Pain in throat 04/18/2013  . Encounter for supervision of other normal pregnancy in first trimester 12/07/2013  . Anxiety during pregnancy in second trimester, antepartum 01/13/2014  . Active labor 06/01/2014  . Vaginal delivery 06/01/2014  . Shoulder dystocia, delivered, current hospitalization 06/01/2014  . Bilateral hand numbness 01/16/2015  . Cough 02/21/2015  .  Cervical radiculopathy at C8 02/27/2015  . Otitis, externa, infective 03/15/2015  . Anxiety and depression 09/03/2015  . 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 Polypharmacy 10/24/2016  . Influenza-like illness 05/13/2017  . Neck pain  11/12/2018  . Chronic neck pain 02/28/2019  . Upper back pain 06/07/2019  . DDD (degenerative disc disease), cervical 06/07/2019   Past Medical History:  Diagnosis Date  . GERD (gastroesophageal reflux disease)   . Headache   . Seizure ([place]) 01/06/2019   Reviewed med, allergies and problem list.    Observations/Objective: No acute distress  Assessment and Plan: Marland KitchenMarland KitchenLatricia was seen today for allergies.  Diagnoses and all orders for this visit:  Anxiety  Nausea -     ondansetron (ZOFRAN-ODT) 8 MG disintegrating tablet; Take 1 tablet (8 mg total) by mouth every 8 (eight) hours as needed for nausea.  Muscle twitching -     tiZANidine (ZANAFLEX) 4 MG capsule; Take 1 capsule (4 mg total) by mouth 3 (three) times daily as needed for muscle spasms.  PND (post-nasal drip) -     levocetirizine (XYZAL) 5 MG tablet; Take 1 tablet (5 mg total) by mouth every evening. -     fluticasone (FLONASE) 50 MCG/ACT nasal spray; Place 2 sprays into both nostrils daily.  Gastroesophageal reflux disease, unspecified whether esophagitis present -     pantoprazole (PROTONIX) 40 MG tablet; Take 1 tablet (40 mg total) by mouth 2 (two) times daily.  Seasonal allergies -     levocetirizine (XYZAL) 5 MG tablet; Take 1 tablet (5 mg total) by mouth every evening. -     fluticasone (FLONASE) 50 MCG/ACT nasal spray; Place 2 sprays into both nostrils daily.   Unclear etiology of worsening GERD.  Increase Protonix to twice daily temporarily.  She does have an appointment with gastroenterology.  I do think it is prudent to get an EGD.  Perhaps treating GERD could help even with her throat clearing and allergy symptoms.  Stop Claritin.  Start Xyzal daily.  Add Flonase daily.  Continue to follow-up with behavioral health regarding anxiety.  Zofran refilled for nausea.  Zanaflex refilled as needed for muscle twitching.    Follow Up Instructions:    I discussed the assessment and treatment plan with the  patient. The patient was provided an opportunity to ask questions and all were answered. The patient agreed with the plan and demonstrated an understanding of the instructions.   The patient was advised to call back or seek an in-person evaluation if the symptoms worsen or if the condition fails to improve as anticipated.  I provided 20 minutes of non-face-to-face time during this encounter.   Iran Planas, PA-C

## 2020-02-03 NOTE — Telephone Encounter (Signed)
Entered into visit.

## 2020-02-05 ENCOUNTER — Other Ambulatory Visit: Payer: Self-pay | Admitting: Physician Assistant

## 2020-02-05 DIAGNOSIS — Z79899 Other long term (current) drug therapy: Secondary | ICD-10-CM

## 2020-02-06 NOTE — Telephone Encounter (Signed)
Last written 12/12/2019 #90 with one refill If needs refill now, looks like taking TID every day. Please advise if you would like to refill?

## 2020-02-08 ENCOUNTER — Encounter: Payer: Self-pay | Admitting: Gastroenterology

## 2020-02-08 ENCOUNTER — Other Ambulatory Visit: Payer: Self-pay | Admitting: Physician Assistant

## 2020-02-09 LAB — CBC WITH DIFFERENTIAL/PLATELET
Basophils Absolute: 0 10*3/uL (ref 0.0–0.2)
Basos: 0 %
EOS (ABSOLUTE): 0.1 10*3/uL (ref 0.0–0.4)
Eos: 1 %
Hematocrit: 41.5 % (ref 34.0–46.6)
Hemoglobin: 13.5 g/dL (ref 11.1–15.9)
Immature Grans (Abs): 0 10*3/uL (ref 0.0–0.1)
Immature Granulocytes: 0 %
Lymphocytes Absolute: 2.4 10*3/uL (ref 0.7–3.1)
Lymphs: 36 %
MCH: 30.6 pg (ref 26.6–33.0)
MCHC: 32.5 g/dL (ref 31.5–35.7)
MCV: 94 fL (ref 79–97)
Monocytes Absolute: 0.5 10*3/uL (ref 0.1–0.9)
Monocytes: 8 %
Neutrophils Absolute: 3.6 10*3/uL (ref 1.4–7.0)
Neutrophils: 55 %
Platelets: 258 10*3/uL (ref 150–450)
RBC: 4.41 x10E6/uL (ref 3.77–5.28)
RDW: 12 % (ref 11.7–15.4)
WBC: 6.6 10*3/uL (ref 3.4–10.8)

## 2020-02-09 LAB — COMPREHENSIVE METABOLIC PANEL
ALT: 10 IU/L (ref 0–32)
AST: 11 IU/L (ref 0–40)
Albumin/Globulin Ratio: 2.9 — ABNORMAL HIGH (ref 1.2–2.2)
Albumin: 4.9 g/dL — ABNORMAL HIGH (ref 3.8–4.8)
Alkaline Phosphatase: 43 IU/L — ABNORMAL LOW (ref 44–121)
BUN/Creatinine Ratio: 6 — ABNORMAL LOW (ref 9–23)
BUN: 4 mg/dL — ABNORMAL LOW (ref 6–20)
Bilirubin Total: <0.2 mg/dL (ref 0.0–1.2)
CO2: 24 mmol/L (ref 20–29)
Calcium: 9.1 mg/dL (ref 8.7–10.2)
Chloride: 98 mmol/L (ref 96–106)
Creatinine, Ser: 0.66 mg/dL (ref 0.57–1.00)
GFR calc Af Amer: 131 mL/min/{1.73_m2} (ref >59)
GFR calc non Af Amer: 114 mL/min/{1.73_m2} (ref >59)
Globulin, Total: 1.7 g/dL (ref 1.5–4.5)
Glucose: 82 mg/dL (ref 65–99)
Potassium: 4.4 mmol/L (ref 3.5–5.2)
Sodium: 137 mmol/L (ref 134–144)
Total Protein: 6.6 g/dL (ref 6.0–8.5)

## 2020-02-13 ENCOUNTER — Other Ambulatory Visit: Payer: Self-pay | Admitting: Physician Assistant

## 2020-02-13 ENCOUNTER — Encounter: Payer: Self-pay | Admitting: Physician Assistant

## 2020-02-13 DIAGNOSIS — R748 Abnormal levels of other serum enzymes: Secondary | ICD-10-CM

## 2020-02-13 DIAGNOSIS — R771 Abnormality of globulin: Secondary | ICD-10-CM

## 2020-02-13 NOTE — Progress Notes (Signed)
Annette Cook,   Hemoglobin is great. No significant blood loss occurring.  Kidney function great.  ALK phos has decreased and your protein levels are elevated.  We need to check for reasons for this. I ordered a urine and extended protein test to have done.

## 2020-02-17 ENCOUNTER — Other Ambulatory Visit: Payer: Self-pay | Admitting: Physician Assistant

## 2020-02-17 ENCOUNTER — Ambulatory Visit (INDEPENDENT_AMBULATORY_CARE_PROVIDER_SITE_OTHER): Payer: Managed Care, Other (non HMO) | Admitting: Gastroenterology

## 2020-02-17 ENCOUNTER — Other Ambulatory Visit (INDEPENDENT_AMBULATORY_CARE_PROVIDER_SITE_OTHER): Payer: Managed Care, Other (non HMO)

## 2020-02-17 ENCOUNTER — Encounter: Payer: Self-pay | Admitting: Gastroenterology

## 2020-02-17 VITALS — BP 120/80 | HR 80 | Ht 60.0 in | Wt 106.0 lb

## 2020-02-17 DIAGNOSIS — K5900 Constipation, unspecified: Secondary | ICD-10-CM

## 2020-02-17 DIAGNOSIS — K625 Hemorrhage of anus and rectum: Secondary | ICD-10-CM

## 2020-02-17 DIAGNOSIS — R112 Nausea with vomiting, unspecified: Secondary | ICD-10-CM

## 2020-02-17 DIAGNOSIS — R1310 Dysphagia, unspecified: Secondary | ICD-10-CM

## 2020-02-17 DIAGNOSIS — K219 Gastro-esophageal reflux disease without esophagitis: Secondary | ICD-10-CM | POA: Diagnosis not present

## 2020-02-17 DIAGNOSIS — Z9889 Other specified postprocedural states: Secondary | ICD-10-CM

## 2020-02-17 DIAGNOSIS — K5909 Other constipation: Secondary | ICD-10-CM

## 2020-02-17 DIAGNOSIS — Z9289 Personal history of other medical treatment: Secondary | ICD-10-CM

## 2020-02-17 DIAGNOSIS — R1084 Generalized abdominal pain: Secondary | ICD-10-CM | POA: Diagnosis not present

## 2020-02-17 DIAGNOSIS — R634 Abnormal weight loss: Secondary | ICD-10-CM

## 2020-02-17 DIAGNOSIS — Z9049 Acquired absence of other specified parts of digestive tract: Secondary | ICD-10-CM

## 2020-02-17 LAB — TSH: TSH: 1.05 u[IU]/mL (ref 0.35–4.50)

## 2020-02-17 MED ORDER — SUPREP BOWEL PREP KIT 17.5-3.13-1.6 GM/177ML PO SOLN
1.0000 | ORAL | 0 refills | Status: DC
Start: 2020-02-17 — End: 2020-04-10

## 2020-02-17 MED ORDER — LINACLOTIDE 145 MCG PO CAPS: 145.0000 ug | ORAL_CAPSULE | Freq: Every day | ORAL | 2 refills | Status: DC

## 2020-02-17 NOTE — Progress Notes (Signed)
[place] VISIT   Primary Care Provider Lavada Mesi [place] [id number] [phone]  Referring Provider Lavada Mesi [place],  [place] [address] [phone]  Patient Profile: Annette Cook is a 34 y.o. female with a pmh significant for ADHD, seizure disorder, anxiety, headaches, insomnia, nephrolithiasis, status post cholecystectomy (cholecystitis with subsequent bile leak requiring ERCP bile duct stenting), status post appendectomy, GERD.  The patient presents to the [facility] for an evaluation and management of problem(s) noted below:  Problem List 1. Generalized abdominal pain   2. Gastroesophageal reflux disease, unspecified whether esophagitis present   3. Pill dysphagia   4. Non-intractable vomiting with nausea, unspecified vomiting type   5. Chronic constipation   6. Rectal bleeding   7. Unintentional weight loss   8. History of cholecystectomy   9. History of ERCP     History of Present Illness This is the patient's first visit to the outpatient [place] clinic.  The patient has multiple GI issues including abdominal pain, nausea, vomiting, GERD, dysphagia, recent rectal bleeding, unintentional weight loss, changes in bowel habits.  The patient earlier this year was evaluated by [facility] gastroenterology as an inpatient as well as an outpatient.  After cholecystectomy she developed a bile leak.  She required an ERCP.  She had an EGD with stent pull few months later after outpatient follow-up had suggested that she was doing well.  The patient had recurrence of her abdominal pain the letter to the hospital with her gallbladder issues.  This is noted to be lower in the abdomen and sometimes generalizes throughout the abdomen.  She has episodes of nausea and vomiting.  She has had a progressive weight loss since 08/14/2018 is lost nearly 20 pounds.   She noted recent bright red blood per rectum.  She believes it to be hemorrhoidal but also has mucus.  She alternates between constipation and diarrhea.  She has had alternating bowel habits for at least a year if not longer.  She has had GERD for many years.  She has had some pill dysphagia.  No liquid dysphagia.  No odynophagia.  Colonoscopy.  Endoscopy with stent follow-up gastroenterology Dr. Watt Climes the surgeon gastroenterology the symptoms are in her daily living.  She is hopeful that we can find an answer to her issues.  He does take nonsteroidals times.  GI Review of Systems Positive as above bloating, periods of early satiety, periods of anorexia Negative for coffee-ground emesis, hematemesis  Review of Systems General: Denies fevers/chills/night sweats HEENT: Positive for history of recurrent oral lesions ulcers for years Cardiovascular: Denies chest pain/palpitations Pulmonary: Denies shortness of breath/nocturnal cough Gastroenterological: See HPI Genitourinary: Positive for episodes of dark urine but no overt hematuria Hematological: Denies easy bruising/bleeding Endocrine: Denies temperature intolerance Dermatological: Denies jaundice Psychological: Mood is stable though she does battle with anxiety   Medications Current Outpatient Medications  Medication Sig Dispense Refill  . atomoxetine (STRATTERA) 80 MG capsule Take 1 capsule (80 mg total) by mouth daily. 30 capsule 3  . clonazePAM (KLONOPIN) 0.5 MG tablet Take as needed for panic attacks no more than once a day. 10 tablet 0  . ferrous sulfate (FER-IN-SOL) 75 (15 Fe) MG/ML SOLN Take by mouth.    . fluticasone (FLONASE) 50 MCG/ACT nasal spray Place 2 sprays into both nostrils daily. 16 g 5  . hydrOXYzine (ATARAX/VISTARIL) 50 MG tablet 1 tablet in  the morning, 2 in the evening (Patient taking differently: 2 in the evening) 360 tablet 0  . ibuprofen (ADVIL) 800 MG tablet TAKE 1 TABLET BY MOUTH EVERY 8 HOURS AS NEEDED 90 tablet  11  . levocetirizine (XYZAL) 5 MG tablet Take 1 tablet (5 mg total) by mouth every evening. 90 tablet 1  . Melatonin 5 MG CAPS Take 5 mg by mouth at bedtime.    . ondansetron (ZOFRAN-ODT) 8 MG disintegrating tablet Take 1 tablet (8 mg total) by mouth every 8 (eight) hours as needed for nausea. 20 tablet 3  . Oxcarbazepine (TRILEPTAL) 300 MG tablet Take 300 mg by mouth 2 (two) times daily.    . pantoprazole (PROTONIX) 40 MG tablet Take 1 tablet (40 mg total) by mouth 2 (two) times daily. 180 tablet 1  . QUEtiapine (SEROQUEL) 50 MG tablet Take by mouth. 50 mg in the morning, 150 mg in the evening    . tiZANidine (ZANAFLEX) 2 MG tablet Take 2 tablets (4 mg total) by mouth at bedtime. 30 tablet 1  . tiZANidine (ZANAFLEX) 4 MG capsule Take 1 capsule (4 mg total) by mouth 3 (three) times daily as needed for muscle spasms. 270 capsule 1  . valACYclovir (VALTREX) 500 MG tablet Take 1 tablet (500 mg total) by mouth daily. 90 tablet 3  . linaclotide (LINZESS) 145 MCG CAPS capsule Take 1 capsule (145 mcg total) by mouth daily before breakfast. 30 capsule 2  . Na Sulfate-K Sulfate-Mg Sulf (SUPREP BOWEL PREP KIT) 17.5-3.13-1.6 GM/177ML SOLN Take 1 kit by mouth as directed. For colonoscopy prep 354 mL 0   No current facility-administered medications for this visit.    Allergies No Known Allergies  Histories Past Medical History:  Diagnosis Date  . ADHD (attention deficit hyperactivity disorder)   . Anxiety   . GERD (gastroesophageal reflux disease)   . Headache   . Insomnia   . Seizure ([facility]) 01/06/2019   last Oct 2020 with weeks of memory loss   Past Surgical History:  Procedure Laterality Date  . APPENDECTOMY    . BILIARY STENT PLACEMENT  03/24/2019   Procedure: BILIARY STENT PLACEMENT;  Surgeon: Clarene Essex, MD;  Location: [facility];  Service: Endoscopy;;  . CHOLECYSTECTOMY N/A 03/17/2019   Procedure: LAPAROSCOPIC CHOLECYSTECTOMY;  Surgeon: Ralene Ok, MD;  Location: [facility];  Service:  General;  Laterality: N/A;  . ERCP N/A 03/24/2019   Procedure: ENDOSCOPIC RETROGRADE CHOLANGIOPANCREATOGRAPHY (ERCP);  Surgeon: Clarene Essex, MD;  Location: [facility];  Service: Endoscopy;  Laterality: N/A;  . ERCP N/A 05/17/2019   Procedure: ENDOSCOPIC RETROGRADE CHOLANGIOPANCREATOGRAPHY (ERCP);  Surgeon: Clarene Essex, MD;  Location: [facility];  Service: Endoscopy;  Laterality: N/A;  with stent removal  . SPHINCTEROTOMY  03/24/2019   Procedure: SPHINCTEROTOMY;  Surgeon: Clarene Essex, MD;  [facility];  Service: Endoscopy;;  . STENT REMOVAL  05/17/2019   Procedure: STENT REMOVAL;  Surgeon: Clarene Essex, MD;  Location: [facility] ENDOSCOPY;  Service: Endoscopy;;  . TONSILLECTOMY AND ADENOIDECTOMY    . WISDOM TOOTH EXTRACTION     Social History   Socioeconomic History  . Marital status: Married    Spouse name: Not on file  . Number of children: Not on file  . Years of education: Not on file  . Highest education level: Not on file  Occupational History  . Occupation: Network engineer  Tobacco Use  . Smoking status: Former Smoker    Packs/day: 1.00    Years: 7.00    Pack years:  7.00    Types: Cigarettes    Quit date: 10/22/2004    Years since quitting: 15.3  . Smokeless tobacco: Never Used  Vaping Use  . Vaping Use: Every day  . Substances: Nicotine  . Devices: Started 3 years ago  Substance and Sexual Activity  . Alcohol use: Yes    Comment: socially  . Drug use: No  . Sexual activity: Yes    Partners: Male    Birth control/protection: None    Comment: Husband had vasectomy  Other Topics Concern  . Not on file  Social History Narrative   Marital Status: Married Barrister's clerk)    Children:  Son Nurse, learning disability) Daughter (Remi)    Pets: None    Living Situation: Lives with husband, step-daughter & children.    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       Alcohol Use:  None   Drug Use:  None   Diet:  Regular   Exercise:  None   Hobbies: Fishing [employer])          Social Determinants of Health   Financial Resource Strain:   . Difficulty of Paying Living Expenses: Not on file  Food Insecurity:   . Worried About Charity fundraiser in the Last Year: Not on file  . Ran Out of Food in the Last Year: Not on file  Transportation Needs:   . Lack of Transportation (Medical): Not on file  . Lack of Transportation (Non-Medical): Not on file  Physical Activity:   . Days of Exercise per Week: Not on file  . Minutes of Exercise per Session: Not on file  Stress:   . Feeling of Stress : Not on file  Social Connections:   . Frequency of Communication with Friends and Family: Not on file  . Frequency of Social Gatherings with Friends and Family: Not on file  . Attends Religious Services: Not on file  . Active Member of Clubs or Organizations: Not on file  . Attends Archivist Meetings: Not on file  . Marital Status: Not on file  Intimate Partner Violence:   . Fear of Current or Ex-Partner: Not on file  . Emotionally Abused: Not on file  . Physically Abused: Not on file  . Sexually Abused: Not on file   Family History  Problem Relation Age of Onset  . Hypertension Mother   . Stroke Mother   . Colon cancer Neg Hx   . Esophageal cancer Neg Hx   . Inflammatory bowel disease Neg Hx   . Liver disease Neg Hx   . Pancreatic cancer Neg Hx   . Rectal cancer Neg Hx   . Stomach cancer Neg Hx    I have reviewed her medical, social, and family history in detail and updated the electronic medical record as necessary.    PHYSICAL EXAMINATION  BP 120/80   Pulse 80   Ht 5' (1.524 m)   Wt 106 lb (48.1 kg)   BMI 20.70 kg/m  Wt Readings from Last 3 Encounters:  02/17/20 106 lb (48.1 kg)  02/03/20 105 lb (47.6 kg)  11/28/19 114 lb (51.7 kg)  GEN: NAD, appears stated age, doesn't appear chronically ill PSYCH: Cooperative, without  pressured speech EYE: Conjunctivae pink, sclerae anicteric ENT: MMM, without oral ulcers, no erythema or exudates noted NECK: Supple CV: RR without R/Gs  RESP: CTAB posteriorly, without wheezing GI: NABS, soft, tenderness to palpation throughout the abdomen, patient, volitional guarding present, no rebound, no hepatosplenomegaly appreciated  GU: DRE offered to patient but she defers as she recognizes she may need a colonoscopy and agrees to have a rectal done at that time MSK/EXT: No lower extremity edema SKIN: No jaundice NEURO:  Alert & Oriented x 3, no focal deficits   REVIEW OF DATA  I reviewed the following data at the time of this encounter:  GI Procedures and Studies  February 2021 EGD - Normal upper GI tract on quick evaluation. - One stent was removed from the common bile duct.  December 2020 ERCP - The major papilla appeared normal. - A biliary sphincterotomy was performed. - The biliary tree was swept and nothing was found. - One plastic stent was placed into the common bile duct. No pancreatic duct wire advancement or injections were done throughout the procedure  Laboratory Studies  Reviewed those in epic and Care Everywhere  Imaging Studies  March 2021 CT renal stone protocol IMPRESSION: 2 mm calculus within the region of the distal right ureter. No hydronephrosis. Punctate nonobstructing lower pole left renal calculus. Large amount of right colonic stool.  No evidence of obstruction. Probable 3 cm left adnexal ovarian cyst.  December 2020 HIDA IMPRESSION: Active biliary leak which is relatively high volume. The majority of the excreted bile radiotracer accumulates in the peritoneal space rather passing into the small bowel. Initial activity collects over the bear space of the liver with subsequent spilling into the peritoneal space. In comparison to MRI of 03/23/2019 there is small amount of fluid along the pericolic gutters and RIGHT hepatic lobe which  presumably represents bile. The exam was incomplete which did limit sensitivity. No organized collection of bile present.  December 2020 MRI MRCP without contrast IMPRESSION: 1. Incomplete exam. Patient reported pain which prohibited complete exam. Pain medications were administered; however, the patient still could not complete exam. 2. With this caveat, the common hepatic duct and common bile duct are mildly dilated. No filling defect within the bile duct identified. No intrahepatic duct dilatation present. 3. There is small amount of fluid within the gallbladder fossa and along the posterior margin of the RIGHT hepatic lobe which is felt to be within normal limits following cholecystectomy. No organized collections. 4. Normal pancreas without evidence of pancreatitis. 5. Subcutaneous gas in the RIGHT abdominal wall related to laparoscopic. Better appreciated on CT.  December 2020 CT abdomen pelvis with contrast IMPRESSION: 1. Status post cholecystectomy with amount of fat stranding changes in the gallbladder bed. No definite loculated fluid collections however are noted. 2. Mildly dilated common bile duct without definite evidence choledocholithiasis. However if further evaluation is required would recommend MRCP. 3. Subcutaneous emphysema over the right anterior abdominal with fat stranding at the umbilicus, likely postsurgical changes.  December 2021 abdominal ultrasound IMPRESSION: 1. Cholelithiasis with borderline gallbladder wall thickening. Patient is focally tender over the gallbladder. These findings are concerning for potential acute cholecystitis. This finding may warrant correlation with nuclear medicine hepatobiliary imaging study to assess for cystic duct patency. 2. Prominence of the common bile duct. More distally, the common bile duct is obscured by gas. From an imaging standpoint, MRCP would be the imaging study of choice to further evaluate the  biliary ductal system. 3.  Study otherwise unremarkable.   ASSESSMENT  Ms. Zahradnik is a 34 y.o. female with a pmh significant for ADHD, seizure disorder, anxiety, headaches, insomnia, nephrolithiasis, status  post cholecystectomy (cholecystitis with subsequent bile leak requiring ERCP bile duct stenting), status post appendectomy, GERD.  The patient is seen today for evaluation and management of:  1. Generalized abdominal pain   2. Gastroesophageal reflux disease, unspecified whether esophagitis present   3. Pill dysphagia   4. Non-intractable vomiting with nausea, unspecified vomiting type   5. Chronic constipation   6. Rectal bleeding   7. Unintentional weight loss   8. History of cholecystectomy   9. History of ERCP    The patient is hemodynamically stable.  Clinically however she has multiple symptoms.  It is not clear that all of the symptoms are truly post cholecystectomy.  She certainly has evidence of chronic cholecystitis with cholelithiasis at time of her gallbladder resection.  With that being said patient has not had overt pancreatitis.  Sphincter of Oddi dysfunction seems unlikely at this time unless the biliary Tree has significantly dilated we see evidence of abnormal LFTs develop or if she develops pancreatitis.  I suspect the patient has irritable bowel syndrome C/D versus irritable bowel syndrome C.  We are going to try and initiate her on Linzess to try and optimize things and see how she does.  Diagnostic endoscopy and colonoscopy will be performed.  These will further evaluate her symptoms of abdominal pain as well as dysphagia as well as nausea and vomiting as well as her recent rectal bleeding.  We'll rule out IBD as well as peptic ulcer disease as well as H. pylori and significant esophagitis or eosinophilic esophagitis.  The risks and benefits of endoscopic evaluation were discussed with the patient; these include but are not limited to the risk of perforation, infection,  bleeding, missed lesions, lack of diagnosis, severe illness requiring hospitalization, as well as anesthesia and sedation related illnesses.  The patient is agreeable to proceed.  If the above work-up is unremarkable then we will consider additional work-up as necessary versus the use and potential need of TCAs for functional abdominal pain.  All patient questions were answered to the best of my ability, and the patient agrees to the aforementioned plan of action with follow-up as indicated.   PLAN  Laboratories as outlined below Right upper quadrant ultrasound to evaluate for biliary duct dilation Diagnostic endoscopy (esophageal/gastric/duodenal biopsies to be obtained) with dilations Plan diagnostic colonoscopy (GI and colon to be interrogated with potential biopsies of both) Initiating Linzess samples 145 mcg daily Taking pantoprazole 40 mg twice daily Additional work-up including SIBO evaluation and EPI evaluation as well as bile salt diarrhea may be considered future depending on findings Would not plan for repeat ERCP unless significant bile duct dilation is noted May need an endoscopic ultrasound to rule out choledocholithiasis though in future we will see what we find versus a repeat MRI/MRCP   Orders Placed This Encounter  Procedures  . US Abdomen Limited RUQ (LIVER/GB)  . TSH  . Ambulatory referral to Gastroenterology    New Prescriptions   LINACLOTIDE (LINZESS) 145 MCG CAPS CAPSULE    Take 1 capsule (145 mcg total) by mouth daily before breakfast.   NA SULFATE-K SULFATE-MG SULF (SUPREP BOWEL PREP KIT) 17.5-3.13-1.6 GM/177ML SOLN    Take 1 kit by mouth as directed. For colonoscopy prep   Modified Medications   No medications on file    Planned Follow Up No follow-ups on file.   Total Time in Face-to-Face and in Coordination of Care for patient including independent/personal interpretation/review of prior testing, medical history, examination, medication adjustment,  communicating results  with the patient directly, and documentation with the EHR is 60 minutes.   Justice Britain, MD [facility] Office # [phone]

## 2020-02-17 NOTE — Patient Instructions (Signed)
You have been scheduled for an abdominal ultrasound at [facility] (1st floor of hospital) on 02/24/20 at 8:00am. Please (arrive at 7:45am) 15 minutes prior to your appointment for registration. Make certain not to have anything to eat or drink 6 hours prior to your appointment. Should you need to reschedule your appointment, please contact radiology at [phone]. This test typically takes about 30 minutes to perform.  You have been scheduled for an endoscopy and colonoscopy. Please follow the written instructions given to you at your visit today. Please pick up your prep supplies at the pharmacy within the next 1-3 days. If you use inhalers (even only as needed), please bring them with you on the day of your procedure.  We have sent the following medications to your pharmacy for you to pick up at your convenience: Suprep, Linzess 133mcg  Start : Linzess 167mcg- once daily. ( Try samples given today first).   Take Pantoprazole 40mg  twice daily.   Your provider has requested that you go to the basement level for lab work before leaving today. Press "B" on the elevator. The lab is located at the first door on the left as you exit the elevator.  Due to recent changes in healthcare laws, you may see the results of your imaging and laboratory studies on MyChart before your provider has had a chance to review them.  We understand that in some cases there may be results that are confusing or concerning to you. Not all laboratory results come back in the same time frame and the provider may be waiting for multiple results in order to interpret others.  Please give [place] 48 hours in order for your provider to thoroughly review all the results before contacting the office for clarification of your results.   Thank you for choosing me and [facility].  Dr. Rush Landmark

## 2020-02-18 DIAGNOSIS — K625 Hemorrhage of anus and rectum: Secondary | ICD-10-CM | POA: Insufficient documentation

## 2020-02-18 DIAGNOSIS — R1110 Vomiting, unspecified: Secondary | ICD-10-CM | POA: Insufficient documentation

## 2020-02-18 DIAGNOSIS — R634 Abnormal weight loss: Secondary | ICD-10-CM | POA: Insufficient documentation

## 2020-02-18 DIAGNOSIS — R1084 Generalized abdominal pain: Secondary | ICD-10-CM | POA: Insufficient documentation

## 2020-02-18 DIAGNOSIS — R1310 Dysphagia, unspecified: Secondary | ICD-10-CM | POA: Insufficient documentation

## 2020-02-18 DIAGNOSIS — Z9889 Other specified postprocedural states: Secondary | ICD-10-CM | POA: Insufficient documentation

## 2020-02-19 ENCOUNTER — Encounter: Payer: Self-pay | Admitting: Gastroenterology

## 2020-02-20 ENCOUNTER — Ambulatory Visit: Payer: Managed Care, Other (non HMO) | Admitting: Gastroenterology

## 2020-02-21 ENCOUNTER — Encounter: Payer: Self-pay | Admitting: Physician Assistant

## 2020-02-22 LAB — LACTATE DEHYDROGENASE, ISOENZYMES
(LD) Fraction 1: 25 % (ref 17–32)
(LD) Fraction 2: 32 % (ref 25–40)
(LD) Fraction 3: 22 % (ref 17–27)
(LD) Fraction 4: 9 % (ref 5–13)
(LD) Fraction 5: 12 % (ref 4–20)
LDH: 153 IU/L (ref 119–226)

## 2020-02-22 LAB — PROTEIN ELECTROPHORESIS
A/G Ratio: 2.5 — ABNORMAL HIGH (ref 0.7–1.7)
Albumin ELP: 4.7 g/dL — ABNORMAL HIGH (ref 2.9–4.4)
Alpha 1: 0.2 g/dL (ref 0.0–0.4)
Alpha 2: 0.5 g/dL (ref 0.4–1.0)
Beta: 0.7 g/dL (ref 0.7–1.3)
Gamma Globulin: 0.6 g/dL (ref 0.4–1.8)
Globulin, Total: 1.9 g/dL — ABNORMAL LOW (ref 2.2–3.9)
Total Protein: 6.6 g/dL (ref 6.0–8.5)

## 2020-02-22 LAB — C-REACTIVE PROTEIN: CRP: <1 mg/L (ref 0–10)

## 2020-02-22 NOTE — Progress Notes (Signed)
Annette Cook,   No M-spike proteins good news.  Break down of globulins is normal. Total is just a hair low.  Albumin is just a hair elevated.  LDH normal.  Some labs still pending.

## 2020-02-22 NOTE — Progress Notes (Signed)
Albumin a little high and globulin a little low.  I would just chop up the elevation to some dehydration but since both proteins. What would you do with this?

## 2020-02-24 ENCOUNTER — Ambulatory Visit (HOSPITAL_COMMUNITY)
Admission: RE | Admit: 2020-02-24 | Discharge: 2020-02-24 | Disposition: A | Discharge status: Home / Self Care | Payer: Managed Care, Other (non HMO) | Source: Ambulatory Visit | Attending: Gastroenterology | Admitting: Gastroenterology

## 2020-02-24 ENCOUNTER — Other Ambulatory Visit: Payer: Self-pay

## 2020-02-24 DIAGNOSIS — R1084 Generalized abdominal pain: Secondary | ICD-10-CM | POA: Diagnosis present

## 2020-02-24 DIAGNOSIS — Z9049 Acquired absence of other specified parts of digestive tract: Secondary | ICD-10-CM | POA: Diagnosis present

## 2020-02-24 DIAGNOSIS — K5909 Other constipation: Secondary | ICD-10-CM | POA: Insufficient documentation

## 2020-02-24 DIAGNOSIS — R112 Nausea with vomiting, unspecified: Secondary | ICD-10-CM

## 2020-02-24 DIAGNOSIS — K625 Hemorrhage of anus and rectum: Secondary | ICD-10-CM | POA: Diagnosis present

## 2020-02-24 DIAGNOSIS — R1310 Dysphagia, unspecified: Secondary | ICD-10-CM | POA: Diagnosis present

## 2020-02-27 NOTE — Progress Notes (Signed)
Annette Cook,   Discussed with Dr. Madilyn Fireman as well. She agrees likely diarrhea/dehydration that caused the protein levels to be off. As diarrhea resolves we could recheck in a month or so.

## 2020-02-28 ENCOUNTER — Ambulatory Visit: Payer: Managed Care, Other (non HMO) | Admitting: Gastroenterology

## 2020-03-02 ENCOUNTER — Other Ambulatory Visit: Payer: Self-pay

## 2020-03-02 DIAGNOSIS — R1084 Generalized abdominal pain: Secondary | ICD-10-CM

## 2020-03-02 NOTE — Telephone Encounter (Signed)
Patient message about persistent pain. Previous ultrasound unremarkable. It is not clear that we have an answer to her symptoms and she is still waiting her endoscopic evaluation. With that being said I think cross-sectional imaging is reasonable as it has not been done in months. A CT abdomen/pelvis with IV and oral contrast will be ordered. Patty, please move forward with trying to schedule this as able. Thanks. GM

## 2020-03-13 ENCOUNTER — Telehealth: Payer: Self-pay

## 2020-03-13 NOTE — Telephone Encounter (Signed)
CT has been changed to [place] on 03/30/20 at 120 pm to arrive at 1 pm at [place].  She will need to pick up contrast.  Left message on machine to call back

## 2020-03-13 NOTE — Telephone Encounter (Signed)
----- Message from Darden Dates sent at 03/13/2020 10:54 AM EST ----- Regarding: RE: site of service It can scheduled at [place]. Yes ----- Message ----- From: Timothy Lasso, RN Sent: 03/13/2020  10:45 AM EST To: Darden Dates Subject: RE: site of service                            Does the CT need to be rescheduled to [place]?? ----- Message ----- From: Darden Dates Sent: 03/13/2020  10:39 AM EST To: Timothy Lasso, RN, Debbe Mounts, CMA Subject: RE: site of service                            Sure.  I sent it to you because you did the amb referral.  Sorry! Amy ----- Message ----- From: Timothy Lasso, RN Sent: 03/13/2020  10:34 AM EST To: Darden Dates Subject: RE: site of service                            [place] scheduled can you please send to her thanks  ----- Message ----- From: Darden Dates Sent: 03/13/2020  10:21 AM EST To: Timothy Lasso, RN Subject: site of service                                Delorise Jackson, So this pt has 2 insurances and I have to get 2 different precerts.  One of her cases is in review and the other won't allow imaging in a hospital setting.  So she will need to be seen in a free standing facility like [facility] for example.  Here is what it tells me.    The customer's benefit requires the procedure be administered at the least intensive setting in order to be covered. The site you have selected may not be considered medically necessary. Please Note: You may change your facility selection to one of the above at any time before proceeding with the clinical review. Do any of the following apply to this request? Is the patient 36 years of age or under? If no, does the patient: -require obstetrical observation; -require perinatology services; -require imaging related to transplantation services at an approved transplantation facility; -have a known CT contrast allergy and use of that contrast agent is  planned; -have a known MR contrast allergy and use of that contrast agent is planned; -require moderate or deep sedation or general anesthesia for the imaging procedure and freestanding facilities providing such sedation are not available -have a documented diagnosis of claustrophobia requiring open magnetic resonance imaging which is not available in a freestanding facility; -have a documented diagnosis of systemic cancer, where previous imaging has been performed at a hospital location and is necessary for continuity of care; -is imaging outside the hospital-based imaging department or facility expected to adversely impact care?  If the answer is "no" to any of the preceding: -is a surgery or procedure being performed at the hospital for which pre-operative or pre-procedure imaging is an integral component of the procedure; -or is equipment for the size of the individual only available at a hospital-based imaging facility? If "yes" to any of the above, please select which applies from this list. If none apply, please select "none of the above."  If 'None of the Above' override is chosen: This service is not eligible for coverage if rendered at the facility you have selected. By choosing 'None of the Above' you attest there is no medical reason for selecting this location and the site will be DENIED. Please proceed with providing information regarding the procedure(s) requested for medical necessity determination.  Thanks Amy

## 2020-03-14 NOTE — Telephone Encounter (Signed)
The pt was provided the phone number to call GI and get another time for her appt.

## 2020-03-14 NOTE — Telephone Encounter (Signed)
Pt is requesting a call back from a nurse regarding her CT scan schedule, pt states she would rather have something closer to 4pm.

## 2020-03-14 NOTE — Telephone Encounter (Signed)
Left message on machine to call back  

## 2020-03-15 ENCOUNTER — Ambulatory Visit (HOSPITAL_COMMUNITY): Payer: Managed Care, Other (non HMO)

## 2020-03-30 ENCOUNTER — Other Ambulatory Visit: Payer: Self-pay

## 2020-03-30 ENCOUNTER — Encounter: Payer: Self-pay | Admitting: Physician Assistant

## 2020-03-30 ENCOUNTER — Ambulatory Visit
Admission: RE | Admit: 2020-03-30 | Discharge: 2020-03-30 | Disposition: A | Discharge status: Home / Self Care | Payer: Managed Care, Other (non HMO) | Source: Ambulatory Visit | Attending: Gastroenterology | Admitting: Gastroenterology

## 2020-03-30 DIAGNOSIS — E8809 Other disorders of plasma-protein metabolism, not elsewhere classified: Secondary | ICD-10-CM

## 2020-03-30 DIAGNOSIS — K529 Noninfective gastroenteritis and colitis, unspecified: Secondary | ICD-10-CM

## 2020-03-30 DIAGNOSIS — R1084 Generalized abdominal pain: Secondary | ICD-10-CM

## 2020-03-30 MED ORDER — IOPAMIDOL (ISOVUE-300) INJECTION 61%
100.0000 mL | Freq: Once | INTRAVENOUS | Status: AC | PRN
  Administered 2020-03-30: 100 mL via INTRAVENOUS

## 2020-04-03 ENCOUNTER — Other Ambulatory Visit: Payer: Self-pay

## 2020-04-03 ENCOUNTER — Other Ambulatory Visit: Payer: Self-pay | Admitting: Physician Assistant

## 2020-04-03 DIAGNOSIS — G8929 Other chronic pain: Secondary | ICD-10-CM

## 2020-04-03 DIAGNOSIS — R1084 Generalized abdominal pain: Secondary | ICD-10-CM

## 2020-04-03 DIAGNOSIS — R1031 Right lower quadrant pain: Secondary | ICD-10-CM

## 2020-04-03 NOTE — Progress Notes (Signed)
Annette Cook.   CMP ordered. I went ahead and ordered CRP/ESR for GI due to your colon wall inflammation.

## 2020-04-04 LAB — COMPLETE METABOLIC PANEL WITH GFR
AG Ratio: 2.4 (calc) (ref 1.0–2.5)
ALT: 8 U/L (ref 6–29)
AST: 12 U/L (ref 10–30)
Albumin: 4.8 g/dL (ref 3.6–5.1)
Alkaline phosphatase (APISO): 39 U/L (ref 31–125)
BUN/Creatinine Ratio: 8 (calc) (ref 6–22)
BUN: 6 mg/dL — ABNORMAL LOW (ref 7–25)
CO2: 28 mmol/L (ref 20–32)
Calcium: 9.4 mg/dL (ref 8.6–10.2)
Chloride: 97 mmol/L — ABNORMAL LOW (ref 98–110)
Creat: 0.72 mg/dL (ref 0.50–1.10)
GFR, Est African American: 125 mL/min/{1.73_m2} (ref >=60)
GFR, Est Non African American: 108 mL/min/{1.73_m2} (ref >=60)
Globulin: 2 g/dL (calc) (ref 1.9–3.7)
Glucose, Bld: 92 mg/dL (ref 65–99)
Potassium: 3.9 mmol/L (ref 3.5–5.3)
Sodium: 134 mmol/L — ABNORMAL LOW (ref 135–146)
Total Bilirubin: 0.4 mg/dL (ref 0.2–1.2)
Total Protein: 6.8 g/dL (ref 6.1–8.1)

## 2020-04-04 LAB — C-REACTIVE PROTEIN: CRP: 0.3 mg/L (ref <8.0)

## 2020-04-04 LAB — SEDIMENTATION RATE: Sed Rate: 2 mm/h (ref 0–20)

## 2020-04-04 NOTE — Telephone Encounter (Signed)
Labs drawn yesterday. Normal ESR/Sed rate.

## 2020-04-04 NOTE — Telephone Encounter (Signed)
Albumin normal now. Great news.  Sodium/electrolytes a little off. Could be due to pain/diet/changes/bowel movements. Not too concerning.

## 2020-04-05 ENCOUNTER — Other Ambulatory Visit: Payer: Self-pay | Admitting: Physician Assistant

## 2020-04-05 MED ORDER — HYDROCODONE-ACETAMINOPHEN 5-325 MG PO TABS: 1.0000 | ORAL_TABLET | Freq: Four times a day (QID) | ORAL | 0 refills | Status: DC | PRN

## 2020-04-05 NOTE — Progress Notes (Signed)
..  PDMP reviewed during this encounter.  10/10 abdominal pain. Seeing GI inflammatory vs infectious colitis.   A few pain pills while waiting for colonoscopy.

## 2020-04-06 LAB — COMPLETE METABOLIC PANEL WITH GFR

## 2020-04-06 LAB — C-REACTIVE PROTEIN

## 2020-04-06 LAB — SEDIMENTATION RATE

## 2020-04-09 NOTE — Telephone Encounter (Signed)
Error chart.

## 2020-04-10 ENCOUNTER — Other Ambulatory Visit: Payer: Self-pay | Admitting: Gastroenterology

## 2020-04-10 ENCOUNTER — Other Ambulatory Visit: Payer: Self-pay

## 2020-04-10 ENCOUNTER — Ambulatory Visit (AMBULATORY_SURGERY_CENTER): Payer: Managed Care, Other (non HMO) | Admitting: Gastroenterology

## 2020-04-10 ENCOUNTER — Other Ambulatory Visit: Payer: Self-pay | Admitting: Physician Assistant

## 2020-04-10 ENCOUNTER — Encounter: Payer: Self-pay | Admitting: Gastroenterology

## 2020-04-10 VITALS — BP 133/91 | HR 83 | Temp 98.4°F | Resp 17 | Ht 60.0 in | Wt 106.0 lb

## 2020-04-10 DIAGNOSIS — K5730 Diverticulosis of large intestine without perforation or abscess without bleeding: Secondary | ICD-10-CM | POA: Diagnosis not present

## 2020-04-10 DIAGNOSIS — K635 Polyp of colon: Secondary | ICD-10-CM

## 2020-04-10 DIAGNOSIS — R1084 Generalized abdominal pain: Secondary | ICD-10-CM

## 2020-04-10 DIAGNOSIS — K2980 Duodenitis without bleeding: Secondary | ICD-10-CM | POA: Diagnosis not present

## 2020-04-10 DIAGNOSIS — F410 Panic disorder [episodic paroxysmal anxiety] without agoraphobia: Secondary | ICD-10-CM

## 2020-04-10 DIAGNOSIS — R933 Abnormal findings on diagnostic imaging of other parts of digestive tract: Secondary | ICD-10-CM

## 2020-04-10 DIAGNOSIS — R1310 Dysphagia, unspecified: Secondary | ICD-10-CM | POA: Diagnosis not present

## 2020-04-10 DIAGNOSIS — K219 Gastro-esophageal reflux disease without esophagitis: Secondary | ICD-10-CM

## 2020-04-10 DIAGNOSIS — D122 Benign neoplasm of ascending colon: Secondary | ICD-10-CM

## 2020-04-10 DIAGNOSIS — B9681 Helicobacter pylori [H. pylori] as the cause of diseases classified elsewhere: Secondary | ICD-10-CM | POA: Diagnosis not present

## 2020-04-10 DIAGNOSIS — K642 Third degree hemorrhoids: Secondary | ICD-10-CM

## 2020-04-10 DIAGNOSIS — K2970 Gastritis, unspecified, without bleeding: Secondary | ICD-10-CM

## 2020-04-10 MED ORDER — SODIUM CHLORIDE 0.9 % IV SOLN: 500.0000 mL | Freq: Once | INTRAVENOUS | Status: DC

## 2020-04-10 MED ORDER — HYDROCORTISONE ACETATE 25 MG RE SUPP: 25.0000 mg | Freq: Two times a day (BID) | RECTAL | 1 refills | Status: DC

## 2020-04-10 MED ORDER — SUCRALFATE 1 GM/10ML PO SUSP: 1.0000 g | Freq: Two times a day (BID) | ORAL | 1 refills | Status: DC

## 2020-04-10 NOTE — Op Note (Signed)
[facility] Patient Name: Annette Cook Procedure Date: 04/10/2020 2:43 PM MRN: [id number] Endoscopist: Justice Britain , MD Age: 34 Referring MD:  Date of Birth: [birth year] Gender: Female Account #: [id number] Procedure:                Colonoscopy Indications:              Generalized abdominal pain, Abnormal CT of the GI                            tract (hyperemia suggestive of colitis), Change in                            bowel habits Medicines:                Monitored Anesthesia Care Procedure:                Pre-Anesthesia Assessment:                           - Prior to the procedure, a History and Physical                            was performed, and patient medications and                            allergies were reviewed. The patient's tolerance of                            previous anesthesia was also reviewed. The risks                            and benefits of the procedure and the sedation                            options and risks were discussed with the patient.                            All questions were answered, and informed consent                            was obtained. Prior Anticoagulants: The patient has                            taken no previous anticoagulant or antiplatelet                            agents except for NSAID medication. ASA Grade                            Assessment: II - A patient with mild systemic                            disease. After reviewing the risks and benefits,  the patient was deemed in satisfactory condition to                            undergo the procedure.                           After obtaining informed consent, the colonoscope                            was passed under direct vision. Throughout the                            procedure, the patient's blood pressure, pulse, and                            oxygen saturations were monitored continuously. The                             Olympus PFC-[id number] (#[id number]) Colonoscope was                            introduced through the anus and advanced to the 10                            cm into the ileum. The colonoscopy was performed                            without difficulty. The patient tolerated the                            procedure. The quality of the bowel preparation was                            adequate. The terminal ileum, ileocecal valve,                            appendiceal orifice, and rectum were photographed. Scope In: 3:04:10 PM Scope Out: 3:25:37 PM Scope Withdrawal Time: 0 hours 16 minutes 56 seconds  Total Procedure Duration: 0 hours 21 minutes 27 seconds  Findings:                 The digital rectal exam findings include                            hemorrhoids. Pertinent negatives include no                            palpable rectal lesions.                           The terminal ileum and ileocecal valve appeared                            normal. Biopsies were taken with a cold forceps for  histology to rule out chronic ileitis.                           A single small-mouthed diverticulum was found in                            the cecum.                           A 2 mm polyp was found in the ascending colon. The                            polyp was sessile. The polyp was removed with a                            cold snare. Resection and retrieval were complete.                           Multiple small-mouthed diverticula were found in                            the recto-sigmoid colon and sigmoid colon.                           Normal mucosa was found in the entire colon.                            Biopsies were taken with a cold forceps for                            histology from the colon to rule out chronic                            colitis/microscopic colitis. Biopsies were taken                            with a cold forceps for histology  from the rectum                            to rule out chronic proctitis.                           Non-bleeding non-thrombosed external and internal                            hemorrhoids were found during retroflexion, during                            perianal exam and during digital exam. The                            hemorrhoids were Grade III (internal hemorrhoids  that prolapse but require manual reduction). Complications:            No immediate complications. Estimated Blood Loss:     Estimated blood loss was minimal. Impression:               - Hemorrhoids found on digital rectal exam.                           - The examined portion of the ileum was normal.                            Biopsied.                           - Cecal diverticulum noted.                           - One 2 mm polyp in the ascending colon, removed                            with a cold snare. Resected and retrieved.                           - Diverticulosis in the recto-sigmoid colon and in                            the sigmoid colon.                           - Normal mucosa in the entire examined colon.                            Biopsied.                           - Non-bleeding non-thrombosed external and internal                            hemorrhoids. Recommendation:           - The patient will be observed post-procedure,                            until all discharge criteria are met.                           - Discharge patient to home.                           - Patient has a contact number available for                            emergencies. The signs and symptoms of potential                            delayed complications were discussed with the  patient. Return to normal activities tomorrow.                            Written discharge instructions were provided to the                            patient.                           -  High fiber diet.                           - Use FiberCon 1-2 tablets PO daily.                           - Continue present medications.                           - Await pathology results.                           - Follow up to be scheduled in next 3-4 weeks in                            clinic.                           - Not clear that Linzess if causing her symptoms                            but certainly if causing significant issues can                            have her hold this medication. In regards to her                            Cymbalta dosing, will have to defer this to her                            psychiatry team as to whether this can be adjusted.                           - Repeat colonoscopy for surveillance based on                            pathology results.                           - The findings and recommendations were discussed                            with the patient.                           - The findings and recommendations were discussed  with the patient's family. Justice Britain, MD 04/10/2020 3:43:59 PM

## 2020-04-10 NOTE — Patient Instructions (Addendum)
Handouts given:  Dilation diet, Hemorrhoids, Diverticulosis, Hiatal Hernia Start High Fiber diet Start using Cepacol orhalls lozenges with cholaraseptic spray for the next 72 hours for sore throat Begin using FiberCon 1-2 tabs daily Continue current medications Start taking carafate 1gm twice daily  Await pathology results Follow up to be scheduled in next 3-4 weeks in clinic  YOU HAD AN ENDOSCOPIC PROCEDURE TODAY AT THE [facility]:   Refer to the procedure report that was given to you for any specific questions about what was found during the examination.  If the procedure report does not answer your questions, please call your gastroenterologist to clarify.  If you requested that your care partner not be given the details of your procedure findings, then the procedure report has been included in a sealed envelope for you to review at your convenience later.  YOU SHOULD EXPECT: Some feelings of bloating in the abdomen. Passage of more gas than usual.  Walking can help get rid of the air that was put into your GI tract during the procedure and reduce the bloating. If you had a lower endoscopy (such as a colonoscopy or flexible sigmoidoscopy) you may notice spotting of blood in your stool or on the toilet paper. If you underwent a bowel prep for your procedure, you may not have a normal bowel movement for a few days.  Please Note:  You might notice some irritation and congestion in your nose or some drainage.  This is from the oxygen used during your procedure.  There is no need for concern and it should clear up in a day or so.  SYMPTOMS TO REPORT IMMEDIATELY:   Following lower endoscopy (colonoscopy or flexible sigmoidoscopy):  Excessive amounts of blood in the stool  Significant tenderness or worsening of abdominal pains  Swelling of the abdomen that is new, acute  Fever of 100F or higher   Following upper endoscopy (EGD)  Vomiting of blood or coffee ground material  New  chest pain or pain under the shoulder blades  Painful or persistently difficult swallowing  New shortness of breath  Fever of 100F or higher  Black, tarry-looking stools  For urgent or emergent issues, a gastroenterologist can be reached at any hour by calling [phone]. Do not use MyChart messaging for urgent concerns.   DIET:  We do recommend a small meal at first, but then you may proceed to your regular diet.  Drink plenty of fluids but you should avoid alcoholic beverages for 24 hours.  ACTIVITY:  You should plan to take it easy for the rest of today and you should NOT DRIVE or use heavy machinery until tomorrow (because of the sedation medicines used during the test).    FOLLOW UP: Our staff will call the number listed on your records 48-72 hours following your procedure to check on you and address any questions or concerns that you may have regarding the information given to you following your procedure. If we do not reach you, we will leave a message.  We will attempt to reach you two times.  During this call, we will ask if you have developed any symptoms of COVID 19. If you develop any symptoms (ie: fever, flu-like symptoms, shortness of breath, cough etc.) before then, please call [phone].  If you test positive for Covid 19 in the 2 weeks post procedure, please call and report this information to [place].    If any biopsies were taken you will be contacted by phone or  by letter within the next 1-3 weeks.  Please call us at [phone] if you have not heard about the biopsies in 3 weeks.   SIGNATURES/CONFIDENTIALITY: You and/or your care partner have signed paperwork which will be entered into your electronic medical record.  These signatures attest to the fact that that the information above on your After Visit Summary has been reviewed and is understood.  Full responsibility of the confidentiality of this discharge information lies with you and/or your care-partner.

## 2020-04-10 NOTE — Progress Notes (Signed)
pt tolerated well. VSS. awake and to recovery. Report given to RN. Bite block used without trauma. 

## 2020-04-10 NOTE — Op Note (Signed)
[facility] Patient Name: Annette Cook Procedure Date: 04/10/2020 2:43 PM MRN: [id number] Endoscopist: Corliss Parish , MD Age: 34 Referring MD:  Date of Birth: 16-Dec-1985 Gender: Female Account #: [id number] Procedure:                Upper GI endoscopy Indications:              Generalized abdominal pain, Dysphagia, Nausea with                            vomiting, Weight loss, History of previous                            cholecystectomy and s/p ERCP with sphincterotomy Medicines:                Monitored Anesthesia Care Procedure:                Pre-Anesthesia Assessment:                           - Prior to the procedure, a History and Physical                            was performed, and patient medications and                            allergies were reviewed. The patient's tolerance of                            previous anesthesia was also reviewed. The risks                            and benefits of the procedure and the sedation                            options and risks were discussed with the patient.                            All questions were answered, and informed consent                            was obtained. Prior Anticoagulants: The patient has                            taken no previous anticoagulant or antiplatelet                            agents except for NSAID medication. ASA Grade                            Assessment: II - A patient with mild systemic                            disease. After reviewing the risks and benefits,  the patient was deemed in satisfactory condition to                            undergo the procedure.                           After obtaining informed consent, the endoscope was                            passed under direct vision. Throughout the                            procedure, the patient's blood pressure, pulse, and                            oxygen saturations were  monitored continuously. The                            Endoscope was introduced through the mouth, and                            advanced to the second part of duodenum. The upper                            GI endoscopy was accomplished without difficulty.                            The patient tolerated the procedure. Scope In: Scope Out: Findings:                 No gross lesions were noted in the entire                            esophagus. Biopsies were taken with a cold forceps                            for histology to rule out EoE/LoE. After the rest                            of the EGD was completed, a guidewire was placed                            and the scope was withdrawn. Dilation was performed                            with a Savary dilator with mild resistance at 18                            mm. The dilation site was examined following                            endoscope reinsertion and showed moderate mucosal  disruption and moderate improvement in luminal                            narrowing just distal to the UES with no evidence                            of perforation.                           The Z-line was regular and was found 39 cm from the                            incisors.                           Patchy moderately erythematous mucosa without                            bleeding was found in the entire examined stomach.                           No other gross lesions were noted in the entire                            examined stomach. Biopsies were taken with a cold                            forceps for histology and Helicobacter pylori                            testing.                           No gross lesions were noted in the duodenal bulb,                            in the first portion of the duodenum and in the                            second portion of the duodenum. Biopsies for                             histology were taken with a cold forceps for                            evaluation of celiac disease. Complications:            No immediate complications. Estimated Blood Loss:     Estimated blood loss was minimal. Impression:               - No gross lesions in esophagus. Biopsied. Dilated.                           - Z-line regular, 39 cm from the incisors.                           -  Erythematous mucosa in the stomach. No other                            gross lesions in the stomach. Biopsied.                           - No gross lesions in the duodenal bulb, in the                            first portion of the duodenum and in the second                            portion of the duodenum. Biopsied. Recommendation:           - Proceed to scheduled colonoscopy.                           - Dilation diet as per protocol.                           - Please use Cepacol or Halls Lozenges +/-                            Chloraseptic spray for next 72-96 hours to aid in                            sore thoat should you experience this.                           - Continue present medications.                           - May consider addition of Carafate as she                            previously had taken in the hospital setting in                            2020.                           - Await pathology results.                           - If symptoms of dysphagia improve s/p dilation and                            are maintained then repeat dilation PRN could be                            entertained. Otherwise, if issues persist then                            would recommend esophageal manometry thereafter.                           -  The findings and recommendations were discussed                            with the patient.                           - The findings and recommendations were discussed                            with the patient's family. Justice Britain,  MD 04/10/2020 3:36:11 PM

## 2020-04-10 NOTE — Progress Notes (Signed)
Patient consents to observer being present for procedure.   

## 2020-04-10 NOTE — Progress Notes (Signed)
Called to room to assist during endoscopic procedure.  Patient ID and intended procedure confirmed with present staff. Received instructions for my participation in the procedure from the performing physician.  

## 2020-04-11 ENCOUNTER — Telehealth: Payer: Self-pay

## 2020-04-11 ENCOUNTER — Other Ambulatory Visit: Payer: Self-pay | Admitting: Physician Assistant

## 2020-04-11 DIAGNOSIS — F411 Generalized anxiety disorder: Secondary | ICD-10-CM

## 2020-04-11 NOTE — Telephone Encounter (Signed)
1615 Per Dr. Meridee Score - I did not need to call pt back.  No orders received. maw         Follow up Call-  Call back number 04/10/2020  Post procedure Call Back phone  # [phone]  Permission to leave phone message Yes  Some recent data might be hidden     Patient questions:  Do you have a fever, pain , or abdominal swelling? Yes.   Pain Score  3   Have you tolerated food without any problems? No.  Have you been able to return to your normal activities? Yes.    Do you have any questions about your discharge instructions: Diet   Yes.   Medications  Yes.   Follow up visit  No.  Do you have questions or concerns about your Care? Yes.   Pt reported she is having throat pain 3/10.  She said the chloraceptic spray and Cepal were "killing my tongue".  She said her throat hurts when she coughs.  She said she has a history of allergies and is taking Zyzal and flonase nasal spray for her allergies.  She said she has only tolerated liquids.  That she is "afraid to eat food since her throat is already hurting".  I suggested to try soft food first.  Also still having the same abdominal pain she was having pre-procedures.  I explained biopsies were taken and the results may take 2-3  weeks to receive.  I told pt I would send this message to Dr. Meridee Score to review.  If any changes in medications pt uses [facility] in [place].        Actions: * If pain score is 4 or above: No action needed, pain <4.   1. Have you developed a fever since your procedure? no  2.   Have you had an respiratory symptoms (SOB or cough) since your procedure? no  3.   Have you tested positive for COVID 19 since your procedure no  4.   Have you had any family members/close contacts diagnosed with the COVID 19 since your procedure?  no   If yes to any of these questions please route to Laverna Peace, RN and Karlton Lemon, RN

## 2020-04-11 NOTE — Telephone Encounter (Signed)
No answer, voicemail full will call again this evening.

## 2020-04-12 MED ORDER — HYDROXYZINE HCL 50 MG PO TABS
ORAL_TABLET | ORAL | 0 refills | Status: DC
Start: 2020-04-12 — End: 2021-04-22

## 2020-04-12 NOTE — Telephone Encounter (Signed)
Thank you for the update. Annette Cook, please reach out to the patient on Thursday and get an update on how she is doing.  If patient is still having issues regards to sore throat then can consider utilization of [employer]'s Magic mouthwash and/your GI cocktail with lidocaine. Please keep me up-to-date. Thanks. GM

## 2020-04-12 NOTE — Telephone Encounter (Signed)
Tried to reach pt by phone and someone answered then hung up. Tried again same thing. Will try later today.

## 2020-04-12 NOTE — Telephone Encounter (Signed)
The pt states that her throat is better than it was yesterday. I offered to send a prescription to her pharmacy for Dukes Magic mouthwash or GI cocktail and she says that she has geographic tongue and is unable to use either due to the burning it causes.  Please advise.

## 2020-04-15 NOTE — Telephone Encounter (Signed)
Thank you for update.  No further recommendations at this time. GM

## 2020-04-16 ENCOUNTER — Other Ambulatory Visit: Payer: Self-pay | Admitting: Physician Assistant

## 2020-04-16 MED ORDER — NYSTATIN 100000 UNIT/ML MT SUSP: 500000.0000 [IU] | Freq: Four times a day (QID) | OROMUCOSAL | 0 refills | Status: DC

## 2020-04-22 ENCOUNTER — Encounter: Payer: Self-pay | Admitting: Gastroenterology

## 2020-04-23 ENCOUNTER — Other Ambulatory Visit: Payer: Self-pay

## 2020-04-23 MED ORDER — METRONIDAZOLE 500 MG PO TABS: 500.0000 mg | ORAL_TABLET | Freq: Three times a day (TID) | ORAL | 0 refills | Status: AC

## 2020-04-23 MED ORDER — AMOXICILLIN 500 MG PO TABS: 1000.0000 mg | ORAL_TABLET | Freq: Two times a day (BID) | ORAL | 0 refills | Status: DC

## 2020-04-23 MED ORDER — CLARITHROMYCIN 500 MG PO TABS: 500.0000 mg | ORAL_TABLET | Freq: Two times a day (BID) | ORAL | 0 refills | Status: AC

## 2020-04-30 ENCOUNTER — Encounter: Payer: Self-pay | Admitting: Physician Assistant

## 2020-04-30 ENCOUNTER — Telehealth (INDEPENDENT_AMBULATORY_CARE_PROVIDER_SITE_OTHER): Payer: Medicaid Other | Admitting: Physician Assistant

## 2020-04-30 DIAGNOSIS — R0981 Nasal congestion: Secondary | ICD-10-CM

## 2020-04-30 DIAGNOSIS — S0993XD Unspecified injury of face, subsequent encounter: Secondary | ICD-10-CM

## 2020-04-30 MED ORDER — MOMETASONE FUROATE 50 MCG/ACT NA SUSP: NASAL | 1 refills | Status: DC

## 2020-04-30 MED ORDER — METHYLPREDNISOLONE 4 MG PO TBPK: ORAL_TABLET | ORAL | 0 refills | Status: DC
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
## 2020-05-03 ENCOUNTER — Telehealth: Payer: Self-pay | Admitting: Gastroenterology

## 2020-05-03 NOTE — Telephone Encounter (Signed)
Pt is requesting a call back from a nurse to discuss the pain in her abdomen, pt wants to know if she could be recommended something for pain, pt states the medications that were prescribed from Dr Rush Landmark are not helping.

## 2020-05-03 NOTE — Telephone Encounter (Signed)
Tried to reach the pt by phone no answer and voice mail full.  Will try later
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
## 2020-05-04 NOTE — Telephone Encounter (Signed)
Notified pt of the recommendations per Dr Rush Landmark.  She will call back if the pain does not improve.  She will continue ibuprofen as directed for now.
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
## 2020-05-08 ENCOUNTER — Other Ambulatory Visit: Payer: Self-pay | Admitting: Physician Assistant

## 2020-05-08 DIAGNOSIS — F900 Attention-deficit hyperactivity disorder, predominantly inattentive type: Secondary | ICD-10-CM

## 2020-05-08 MED ORDER — ATOMOXETINE HCL 80 MG PO CAPS: 80.0000 mg | ORAL_CAPSULE | Freq: Every day | ORAL | 3 refills | Status: DC

## 2020-05-14 ENCOUNTER — Other Ambulatory Visit: Payer: Self-pay

## 2020-05-14 ENCOUNTER — Other Ambulatory Visit: Payer: Self-pay | Admitting: Physician Assistant

## 2020-05-14 DIAGNOSIS — D649 Anemia, unspecified: Secondary | ICD-10-CM

## 2020-05-14 MED ORDER — HYDROCODONE-ACETAMINOPHEN 5-325 MG PO TABS: 1.0000 | ORAL_TABLET | Freq: Four times a day (QID) | ORAL | 0 refills | Status: DC | PRN

## 2020-05-14 NOTE — Progress Notes (Signed)
Spoke with patient over phone briefly.   Last norco 1/2 #20.   Marland KitchenMarland KitchenPDMP reviewed during this encounter.   Pt just finished treatment for h.pylori on Friday. Her lower abdominal pain is severe with tarry stools. She cannot take NSAIDs. GI is working her up but no pain rx. She is requesting refills on pain rx. Tylenol is not working.

## 2020-05-17 ENCOUNTER — Telehealth: Payer: Self-pay | Admitting: Neurology

## 2020-05-17 NOTE — Telephone Encounter (Signed)
Prior Authorization for [employer] submitted via covermymeds. Awaiting response.

## 2020-05-18 NOTE — Telephone Encounter (Signed)
Received denial for coverage.

## 2020-05-22 ENCOUNTER — Ambulatory Visit: Payer: Managed Care, Other (non HMO) | Admitting: Gastroenterology

## 2020-06-07 ENCOUNTER — Telehealth: Payer: Self-pay | Admitting: Physician Assistant

## 2020-06-07 MED ORDER — HYDROCODONE-ACETAMINOPHEN 5-325 MG PO TABS: 1.0000 | ORAL_TABLET | Freq: Four times a day (QID) | ORAL | 0 refills | Status: DC | PRN

## 2020-06-07 NOTE — Telephone Encounter (Signed)
..  PDMP reviewed during this encounter. Pt is not getting controlled substances from anyone else.  Last fill was 20 tablets 1/31.  She calls in with lower abdominal pain for 3 days. She has appt tomorrow. Sent small quantity to pharmacy and will evaluate tomorrow.

## 2020-06-08 ENCOUNTER — Ambulatory Visit (INDEPENDENT_AMBULATORY_CARE_PROVIDER_SITE_OTHER): Payer: PRIVATE HEALTH INSURANCE | Admitting: Physician Assistant

## 2020-06-08 ENCOUNTER — Other Ambulatory Visit: Payer: Self-pay

## 2020-06-08 ENCOUNTER — Ambulatory Visit (INDEPENDENT_AMBULATORY_CARE_PROVIDER_SITE_OTHER): Payer: PRIVATE HEALTH INSURANCE

## 2020-06-08 VITALS — BP 145/101 | HR 90 | Ht 60.0 in | Wt 98.0 lb

## 2020-06-08 DIAGNOSIS — R634 Abnormal weight loss: Secondary | ICD-10-CM

## 2020-06-08 DIAGNOSIS — M545 Low back pain, unspecified: Secondary | ICD-10-CM

## 2020-06-08 DIAGNOSIS — Z8619 Personal history of other infectious and parasitic diseases: Secondary | ICD-10-CM

## 2020-06-08 DIAGNOSIS — G8929 Other chronic pain: Secondary | ICD-10-CM

## 2020-06-08 LAB — POCT URINALYSIS DIP (CLINITEK)
Bilirubin, UA: NEGATIVE
Blood, UA: NEGATIVE
Glucose, UA: NEGATIVE mg/dL
Ketones, POC UA: NEGATIVE mg/dL
Nitrite, UA: NEGATIVE
POC PROTEIN,UA: NEGATIVE
Spec Grav, UA: 1.02 (ref 1.010–1.025)
Urobilinogen, UA: 0.2 E.U./dL
pH, UA: 7 (ref 5.0–8.0)

## 2020-06-08 MED ORDER — KETOROLAC TROMETHAMINE 60 MG/2ML IM SOLN
60.0000 mg | Freq: Once | INTRAMUSCULAR | Status: AC
  Administered 2020-06-08: 60 mg via INTRAMUSCULAR

## 2020-06-08 NOTE — Progress Notes (Deleted)
4 day history Right lower back pain Travels around to ribs on right side Has used Norco which helps Getting UA No urine symptoms  Front and back treated h.pylori. not tested for clear.   Down at hip bone right wraps around.  Eating makes it worse Tylenol makes feel betst.  Bowel movements liquid daily Dark urine Drinking water 4 bottles a day.  Bowel movemg No NSAId due to h.pylori CT abd 2021 decebmer  BP high  Weight loss lost 8lbs since December.   Tried   April 2021 MRI spine

## 2020-06-08 NOTE — Patient Instructions (Signed)
Start medrol dose pack.  Get xray and labs.   Let me know if you want lyrica.

## 2020-06-09 LAB — CBC WITH DIFFERENTIAL/PLATELET
Absolute Monocytes: 656 cells/uL (ref 200–950)
Basophils Absolute: 16 cells/uL (ref 0–200)
Basophils Relative: 0.2 %
Eosinophils Absolute: 32 cells/uL (ref 15–500)
Eosinophils Relative: 0.4 %
HCT: 42 % (ref 35.0–45.0)
Hemoglobin: 14.2 g/dL (ref 11.7–15.5)
Lymphs Abs: 1920 cells/uL (ref 850–3900)
MCH: 31.8 pg (ref 27.0–33.0)
MCHC: 33.8 g/dL (ref 32.0–36.0)
MCV: 94 fL (ref 80.0–100.0)
MPV: 9.6 fL (ref 7.5–12.5)
Monocytes Relative: 8.2 %
Neutro Abs: 5376 cells/uL (ref 1500–7800)
Neutrophils Relative %: 67.2 %
Platelets: 238 10*3/uL (ref 140–400)
RBC: 4.47 10*6/uL (ref 3.80–5.10)
RDW: 11.8 % (ref 11.0–15.0)
Total Lymphocyte: 24 %
WBC: 8 10*3/uL (ref 3.8–10.8)

## 2020-06-09 LAB — COMPLETE METABOLIC PANEL WITH GFR
AG Ratio: 2.5 (calc) (ref 1.0–2.5)
ALT: 9 U/L (ref 6–29)
AST: 12 U/L (ref 10–30)
Albumin: 4.7 g/dL (ref 3.6–5.1)
Alkaline phosphatase (APISO): 31 U/L (ref 31–125)
BUN: 9 mg/dL (ref 7–25)
CO2: 27 mmol/L (ref 20–32)
Calcium: 9.2 mg/dL (ref 8.6–10.2)
Chloride: 99 mmol/L (ref 98–110)
Creat: 0.66 mg/dL (ref 0.50–1.10)
GFR, Est African American: 132 mL/min/{1.73_m2} (ref >=60)
GFR, Est Non African American: 114 mL/min/{1.73_m2} (ref >=60)
Globulin: 1.9 g/dL (calc) (ref 1.9–3.7)
Glucose, Bld: 85 mg/dL (ref 65–99)
Potassium: 4 mmol/L (ref 3.5–5.3)
Sodium: 135 mmol/L (ref 135–146)
Total Bilirubin: 0.3 mg/dL (ref 0.2–1.2)
Total Protein: 6.6 g/dL (ref 6.1–8.1)

## 2020-06-09 LAB — IRON,TIBC AND FERRITIN PANEL
%SAT: 14 % (calc) — ABNORMAL LOW (ref 16–45)
Ferritin: 18 ng/mL (ref 16–154)
Iron: 46 ug/dL (ref 40–190)
TIBC: 323 mcg/dL (calc) (ref 250–450)

## 2020-06-09 LAB — TSH: TSH: 0.7 mIU/L

## 2020-06-11 ENCOUNTER — Other Ambulatory Visit: Payer: Self-pay | Admitting: Physician Assistant

## 2020-06-11 MED ORDER — FERROUS SULFATE 75 (15 FE) MG/ML PO SOLN: 30.0000 mg | Freq: Every day | ORAL | 3 refills | Status: DC

## 2020-06-11 NOTE — Progress Notes (Signed)
Annette Cook,   Thyroid great.  Hemoglobin good. Iron stores and serum iron low. You do need to be taking at least 325mg  iron daily.  Kidney, liver, glucose looks good.

## 2020-06-11 NOTE — Progress Notes (Signed)
Lumbar xray shows no acute changes and good alignment and disc spaces.  Would you be ok with at least trying some physical therapy? If so what location is most convenient.

## 2020-06-12 ENCOUNTER — Encounter: Payer: Self-pay | Admitting: Physician Assistant

## 2020-06-12 DIAGNOSIS — R634 Abnormal weight loss: Secondary | ICD-10-CM | POA: Insufficient documentation

## 2020-06-12 DIAGNOSIS — M545 Low back pain, unspecified: Secondary | ICD-10-CM | POA: Insufficient documentation

## 2020-06-12 DIAGNOSIS — G8929 Other chronic pain: Secondary | ICD-10-CM | POA: Insufficient documentation

## 2020-06-12 DIAGNOSIS — Z8619 Personal history of other infectious and parasitic diseases: Secondary | ICD-10-CM | POA: Insufficient documentation

## 2020-06-12 MED ORDER — PREGABALIN 50 MG PO CAPS: 50.0000 mg | ORAL_CAPSULE | Freq: Two times a day (BID) | ORAL | 2 refills | Status: DC

## 2020-06-12 NOTE — Progress Notes (Signed)
Subjective:    Patient ID: Annette Cook, female    DOB: September 20, 1985, 35 y.o.   MRN: [id number]  HPI  Patient is a 35 year old female with GERD, recent status post cholecystectomy, recent H. pylori infection, anxiety, depression, myofascial pain syndrome, chronic pain syndrome who presents to the clinic with chronic right sided low back pain.  She represents a 4-day history of worsening right-sided back pain.  She denies any trauma or injury.  She rates the pain an 8 out of 10 and constant.  Seems to travel around into the right sided abdomen.  She denies any urinary symptoms, fever, chills, nausea, vomiting.  Due to her recent H. pylori infection and ongoing GERD she cannot take anti-inflammatories.  Norco is the only thing that has been helping.  She has not been tested for H. pylori clearance.  She does admit this back pain was going on when she was diagnosed with H. pylori as well.  She does feel like eating makes it worse.  She is having bowel movements and they are more soft and liquidy.  She denies any melena or hematochezia.  She is drinking lots of water daily around 4 bottles.  She does feel like her urine is darker than normal.  She has lost 8 pounds since December.  She has no desire to eat.  Last CT of the abdomen was done December 2021 that some some colonic inflammation. Managed by GI.   Last MRI of her lumbar spine was April 2021. No significant musculoskeletal abnormalities only mild DDD.      .. Active Ambulatory Problems    Diagnosis Date Noted  . 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 Generalized anxiety disorder 10/22/2012  . Insomnia 10/22/2012  . Panic attack 10/22/2012  . ADHD (attention deficit hyperactivity disorder) 10/22/2012  . Acute stress reaction 07/08/2013  . 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 Takes daily multivitamins 09/12/2016  . Opioid use agreement exists 10/24/2016  . Encounter for  chronic pain management 10/24/2016  . Adult ADHD 04/10/2017  . Overactive bladder 01/21/2018  . PTSD (post-traumatic stress disorder) 11/12/2018  . Anxiety 11/12/2018  . Myofascial pain syndrome 11/12/2018  . Drug withdrawal seizure with delirium ([place]) 02/28/2019  . Elevated blood pressure reading 02/28/2019  . Gastroesophageal reflux disease 02/28/2019  . Moderate episode of recurrent major depressive disorder ([place]) 02/28/2019  . History of cholecystectomy 03/23/2019  . Black stools 07/11/2019  . Leukocytes in urine 07/11/2019  . Kidney stone on right side 07/11/2019  . Left ovarian cyst 07/11/2019  . Constipation 07/11/2019  . Generalized abdominal pain 02/18/2020  . Pill dysphagia 02/18/2020  . Non-intractable vomiting 02/18/2020  . Rectal bleeding 02/18/2020  . Unintentional weight loss 02/18/2020  . History of ERCP 02/18/2020  . Chronic right-sided low back pain without sciatica 06/12/2020  . Unintended weight loss 06/12/2020  . History of Helicobacter pylori infection 06/12/2020   Resolved Ambulatory Problems    Diagnosis Date Noted  . Supervision of normal IUP (intrauterine pregnancy) in multigravida 11/09/2011  . Rh negative status during pregnancy 02/03/2012  . Marginal placenta previa 02/03/2012  . Pain in throat 04/18/2013  . Encounter for supervision of other normal pregnancy in first trimester 12/07/2013  . Anxiety during pregnancy in second trimester, antepartum 01/13/2014  . Active labor 06/01/2014  . Vaginal delivery 06/01/2014  . Shoulder dystocia, delivered, current hospitalization 06/01/2014  . Bilateral hand numbness 01/16/2015  . Cough 02/21/2015  .  Cervical radiculopathy at C8 02/27/2015  . Otitis, externa, infective 03/15/2015  . Anxiety and depression 09/03/2015  . 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 Polypharmacy 10/24/2016  . Influenza-like illness 05/13/2017  . Neck pain 11/12/2018  . Chronic neck pain 02/28/2019  . Upper back pain 06/07/2019   . DDD (degenerative disc disease), cervical 06/07/2019   Past Medical History:  Diagnosis Date  . Asthma   . Depression   . GERD (gastroesophageal reflux disease)   . Headache   . Seizure ([place]) 01/06/2019       Review of Systems See HPI.     Objective:   Physical Exam Vitals reviewed.  Constitutional:      Appearance: Normal appearance.  HENT:     Head: Normocephalic.  Neck:     Vascular: No carotid bruit.  Cardiovascular:     Rate and Rhythm: Normal rate and regular rhythm.     Pulses: Normal pulses.     Heart sounds: Normal heart sounds. No murmur heard.   Pulmonary:     Effort: Pulmonary effort is normal.     Breath sounds: Normal breath sounds. No wheezing or rhonchi.  Abdominal:     General: Bowel sounds are normal. There is no distension.     Palpations: Abdomen is soft. There is no mass.     Tenderness: There is no abdominal tenderness. There is no right CVA tenderness, left CVA tenderness, guarding or rebound.  Musculoskeletal:     Right lower leg: No edema.     Left lower leg: No edema.     Comments: NROM at waist.  Negative SLR, bilateral.  Strength 5/5, lower ext bilateral. No tenderness over lumbar spine but tenderness over right lower back in gluteal muscles and SI joint area.    Lymphadenopathy:     Cervical: No cervical adenopathy.  Neurological:     General: No focal deficit present.     Mental Status: She is alert and oriented to person, place, and time.  Psychiatric:        Mood and Affect: Mood normal.     UA small leuks.       Assessment & Plan:  Marland KitchenMarland KitchenJamisen was seen today for back pain.  Diagnoses and all orders for this visit:  Chronic right-sided low back pain without sciatica -     TSH -     CBC with Differential/Platelet -     Fe+TIBC+Fer -     DG Lumbar Spine Complete -     COMPLETE METABOLIC PANEL WITH GFR -     POCT URINALYSIS DIP (CLINITEK) -     ketorolac (TORADOL) injection 60 mg -     pregabalin (LYRICA) 50 MG  capsule; Take 1 capsule (50 mg total) by mouth 2 (two) times daily.  Unintended weight loss -     TSH -     CBC with Differential/Platelet -     Fe+TIBC+Fer -     DG Lumbar Spine Complete -     COMPLETE METABOLIC PANEL WITH GFR  History of Helicobacter pylori infection   Unclear etiology of pain.  Recent imaging CT abdomen/pelvis, US abdomen.  Pt has been treated for h.pylori and this pain is not a likely representation.  Sounds more musculoskeletal but prior work up has not shown any source to pain.  Will get new lumbar xray today.  toradol 60mg  IM given today.  Pt has norco to use for break through pain. Discussed use sparingly.  Avoid NsAIDS due to  gastritis and h.pylori issues. Stay on protonix.  Ok to due burst of medrol dose pack to see if any improvement. Take with food.  Pt has tried and failed PT.  Will try lyrica for pain control.   Pt is losing weight.  Could be more from h.pylori. follow up with GI for clearance.  Labs ordered for further investigation.   Spent 40 minutes with patient reviewing chart/imaging, making plan, ordering labs and discussing plan.

## 2020-06-14 ENCOUNTER — Encounter: Payer: Self-pay | Admitting: Physician Assistant

## 2020-06-28 ENCOUNTER — Other Ambulatory Visit: Payer: Self-pay

## 2020-06-28 ENCOUNTER — Encounter: Payer: Self-pay | Admitting: Physician Assistant

## 2020-06-28 DIAGNOSIS — R110 Nausea: Secondary | ICD-10-CM

## 2020-06-28 MED ORDER — ONDANSETRON 8 MG PO TBDP: 8.0000 mg | ORAL_TABLET | Freq: Three times a day (TID) | ORAL | 3 refills | Status: DC | PRN

## 2020-07-02 ENCOUNTER — Encounter: Payer: Self-pay | Admitting: Physician Assistant

## 2020-07-02 ENCOUNTER — Other Ambulatory Visit: Payer: Self-pay | Admitting: Gastroenterology

## 2020-07-10 ENCOUNTER — Encounter: Payer: Self-pay | Admitting: Physician Assistant

## 2020-07-10 DIAGNOSIS — R0982 Postnasal drip: Secondary | ICD-10-CM

## 2020-07-10 DIAGNOSIS — J302 Other seasonal allergic rhinitis: Secondary | ICD-10-CM

## 2020-07-11 MED ORDER — HYDROCOD POLST-CPM POLST ER 10-8 MG/5ML PO SUER: 5.0000 mL | Freq: Two times a day (BID) | ORAL | 0 refills | Status: DC | PRN

## 2020-07-12 ENCOUNTER — Other Ambulatory Visit: Payer: Self-pay | Admitting: Physician Assistant

## 2020-07-12 MED ORDER — HYDROCODONE-ACETAMINOPHEN 5-325 MG PO TABS: 1.0000 | ORAL_TABLET | Freq: Four times a day (QID) | ORAL | 0 refills | Status: DC | PRN

## 2020-07-12 NOTE — Progress Notes (Signed)
Last norco fill was 2/24.  Has appt with GI and GYN.  ..PDMP reviewed during this encounter.   Sent 20.

## 2020-07-19 MED ORDER — LEVOCETIRIZINE DIHYDROCHLORIDE 5 MG PO TABS: 5.0000 mg | ORAL_TABLET | Freq: Every evening | ORAL | 1 refills | Status: DC

## 2020-07-19 NOTE — Addendum Note (Signed)
Addended by: Narda Rutherford on: 07/19/2020 02:32 PM   Modules accepted: Orders

## 2020-08-17 ENCOUNTER — Telehealth: Payer: Self-pay

## 2020-08-17 MED ORDER — HYDROCODONE-ACETAMINOPHEN 5-325 MG PO TABS: 1.0000 | ORAL_TABLET | Freq: Three times a day (TID) | ORAL | 0 refills | Status: DC | PRN

## 2020-08-17 NOTE — Telephone Encounter (Signed)
We are going to have to get you on a pain contract we are giving you pain medicine almost every month. We will need in person to do a drug screen and fill out paperwork. I will give you a few tabs to get until then.  Marland KitchenMarland KitchenPDMP reviewed during this encounter.

## 2020-08-17 NOTE — Telephone Encounter (Signed)
Pt called and asked for a refill on her Norco. Not on current med list.

## 2020-09-07 ENCOUNTER — Telehealth: Payer: Self-pay | Admitting: Physician Assistant

## 2020-09-07 MED ORDER — HYDROCODONE-ACETAMINOPHEN 5-325 MG PO TABS: 1.0000 | ORAL_TABLET | Freq: Four times a day (QID) | ORAL | 0 refills | Status: AC | PRN

## 2020-09-07 NOTE — Telephone Encounter (Signed)
..  PDMP reviewed during this encounter. Last fill 5/6.  Refilled.  Seeing GI. Has CT scheduled.

## 2020-09-12 ENCOUNTER — Other Ambulatory Visit: Payer: Self-pay | Admitting: Gastroenterology

## 2020-09-12 DIAGNOSIS — R109 Unspecified abdominal pain: Secondary | ICD-10-CM

## 2020-09-12 DIAGNOSIS — M545 Low back pain, unspecified: Secondary | ICD-10-CM

## 2020-09-14 ENCOUNTER — Other Ambulatory Visit: Payer: PRIVATE HEALTH INSURANCE

## 2020-09-14 ENCOUNTER — Other Ambulatory Visit: Payer: Self-pay | Admitting: Physician Assistant

## 2020-09-14 DIAGNOSIS — R253 Fasciculation: Secondary | ICD-10-CM

## 2020-09-19 ENCOUNTER — Inpatient Hospital Stay: Admission: RE | Admit: 2020-09-19 | Payer: PRIVATE HEALTH INSURANCE | Source: Ambulatory Visit

## 2020-09-19 ENCOUNTER — Other Ambulatory Visit: Payer: PRIVATE HEALTH INSURANCE

## 2020-09-26 ENCOUNTER — Other Ambulatory Visit: Payer: Self-pay | Admitting: Physician Assistant

## 2020-09-26 DIAGNOSIS — F900 Attention-deficit hyperactivity disorder, predominantly inattentive type: Secondary | ICD-10-CM

## 2020-09-26 NOTE — Telephone Encounter (Signed)
Last written 05/08/2020 #30 with 3 refills Last appt 06/08/2020

## 2020-10-02 MED ORDER — HYDROCODONE-ACETAMINOPHEN 5-325 MG PO TABS: 1.0000 | ORAL_TABLET | Freq: Four times a day (QID) | ORAL | 0 refills | Status: DC | PRN

## 2020-10-02 NOTE — Telephone Encounter (Signed)
Last refill of norco 5/27 #20.  Call needing refill for chronic pain.  [name].PDMP reviewed during this encounter. Needs to schedule appt for pain contract.  Refilled today.

## 2020-10-03 ENCOUNTER — Ambulatory Visit
Admission: RE | Admit: 2020-10-03 | Discharge: 2020-10-03 | Disposition: A | Discharge status: Home / Self Care | Payer: No Typology Code available for payment source | Source: Ambulatory Visit | Attending: Gastroenterology | Admitting: Gastroenterology

## 2020-10-03 DIAGNOSIS — R109 Unspecified abdominal pain: Secondary | ICD-10-CM

## 2020-10-03 DIAGNOSIS — M545 Low back pain, unspecified: Secondary | ICD-10-CM

## 2020-10-03 MED ORDER — IOPAMIDOL (ISOVUE-300) INJECTION 61%
100.0000 mL | Freq: Once | INTRAVENOUS | Status: AC | PRN
  Administered 2020-10-03: 100 mL via INTRAVENOUS

## 2020-10-05 ENCOUNTER — Other Ambulatory Visit: Payer: Self-pay | Admitting: Gastroenterology

## 2020-10-05 ENCOUNTER — Other Ambulatory Visit (HOSPITAL_COMMUNITY): Payer: Self-pay | Admitting: Gastroenterology

## 2020-10-05 DIAGNOSIS — R110 Nausea: Secondary | ICD-10-CM

## 2020-10-05 DIAGNOSIS — R634 Abnormal weight loss: Secondary | ICD-10-CM

## 2020-10-09 ENCOUNTER — Ambulatory Visit (INDEPENDENT_AMBULATORY_CARE_PROVIDER_SITE_OTHER): Payer: No Typology Code available for payment source | Admitting: Physician Assistant

## 2020-10-09 ENCOUNTER — Other Ambulatory Visit: Payer: Self-pay

## 2020-10-09 ENCOUNTER — Encounter: Payer: Self-pay | Admitting: Physician Assistant

## 2020-10-09 VITALS — BP 138/90 | HR 88 | Temp 98.2°F | Resp 20 | Ht 60.0 in | Wt 106.4 lb

## 2020-10-09 DIAGNOSIS — Z0289 Encounter for other administrative examinations: Secondary | ICD-10-CM | POA: Diagnosis not present

## 2020-10-09 DIAGNOSIS — R1084 Generalized abdominal pain: Secondary | ICD-10-CM | POA: Diagnosis not present

## 2020-10-09 DIAGNOSIS — M545 Low back pain, unspecified: Secondary | ICD-10-CM

## 2020-10-09 DIAGNOSIS — G894 Chronic pain syndrome: Secondary | ICD-10-CM | POA: Diagnosis not present

## 2020-10-09 DIAGNOSIS — G8929 Other chronic pain: Secondary | ICD-10-CM

## 2020-10-09 DIAGNOSIS — K582 Mixed irritable bowel syndrome: Secondary | ICD-10-CM

## 2020-10-09 DIAGNOSIS — R399 Unspecified symptoms and signs involving the genitourinary system: Secondary | ICD-10-CM

## 2020-10-09 DIAGNOSIS — Z8619 Personal history of other infectious and parasitic diseases: Secondary | ICD-10-CM

## 2020-10-09 DIAGNOSIS — R140 Abdominal distension (gaseous): Secondary | ICD-10-CM

## 2020-10-09 DIAGNOSIS — M549 Dorsalgia, unspecified: Secondary | ICD-10-CM

## 2020-10-09 LAB — POCT URINALYSIS DIP (CLINITEK)
Bilirubin, UA: NEGATIVE
Blood, UA: NEGATIVE
Glucose, UA: NEGATIVE mg/dL
Ketones, POC UA: NEGATIVE mg/dL
Leukocytes, UA: NEGATIVE
Nitrite, UA: NEGATIVE
POC PROTEIN,UA: NEGATIVE
Spec Grav, UA: 1.015 (ref 1.010–1.025)
Urobilinogen, UA: 0.2 E.U./dL
pH, UA: 7.5 (ref 5.0–8.0)

## 2020-10-09 MED ORDER — HYDROCODONE-ACETAMINOPHEN 5-325 MG PO TABS: 1.0000 | ORAL_TABLET | Freq: Four times a day (QID) | ORAL | 0 refills | Status: DC | PRN

## 2020-10-09 NOTE — Progress Notes (Signed)
Subjective:    Patient ID: Annette Cook, female    DOB: [birth year], 35 y.o.   MRN: [id number]  HPI Pt is a 35 yo female with GERD, IBS, hx of h.pylori, ADHD, myofascial pain who presents to the clinic with chronic generalized abdominal pain. She is working with GI for solution. She does not have gallbladder after bing removed. She is on protonix bid, carafate and just recently bentyl was given. Recent CT was normal with no acute findings. She is being scheduled for more GI work up. She is using norco on average once a day but some days none and some days 1-2 times. She needs to be on pain contract. Pain is not constant but comes in flares.   Just today started with some painful urination and vaginal irritation. She wants to make sure no UTI, yeast or BV.   .. 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 03/15/2015   Patellofemoral pain syndrome 04/12/2015   Takes daily multivitamins 09/12/2016   Opioid use agreement exists 10/24/2016   Encounter for chronic pain management 10/24/2016   Adult ADHD 04/10/2017   Overactive bladder 01/21/2018   PTSD (post-traumatic stress disorder) 11/12/2018   Anxiety 11/12/2018   Myofascial pain syndrome 11/12/2018   Drug withdrawal seizure with delirium ([place]) 02/28/2019   Elevated blood pressure reading 02/28/2019   Gastroesophageal reflux disease 02/28/2019   Moderate episode of recurrent major depressive disorder ([place]) 02/28/2019   History of cholecystectomy 03/23/2019   Black stools 07/11/2019   Leukocytes in urine 07/11/2019   Kidney stone on right side 07/11/2019   Left ovarian cyst 07/11/2019   Constipation 07/11/2019   Generalized abdominal pain 02/18/2020   Pill  dysphagia 02/18/2020   Non-intractable vomiting 02/18/2020   Rectal bleeding 02/18/2020   Unintentional weight loss 02/18/2020   History of ERCP 02/18/2020   Chronic right-sided low back pain without sciatica 06/12/2020   Unintended weight loss 00/93/8182   History of Helicobacter pylori infection 06/12/2020   Chronic pain syndrome 10/09/2020   Pain management contract agreement 10/09/2020   Chronic upper abdominal pain 10/10/2020   Mid back pain on right side 10/10/2020   Irritable bowel syndrome with both constipation and diarrhea 10/10/2020   Bloating 10/10/2020   Resolved Ambulatory Problems    Diagnosis Date Noted   Supervision of normal IUP (intrauterine pregnancy) in multigravida 11/09/2011   Rh negative status during pregnancy 02/03/2012   Marginal placenta previa 02/03/2012   Pain in throat 04/18/2013   Encounter for supervision of other normal pregnancy in first trimester 12/07/2013   Anxiety during pregnancy in second trimester, antepartum 01/13/2014   Active labor 06/01/2014   Vaginal delivery 06/01/2014   Shoulder dystocia, delivered, current hospitalization 06/01/2014   Bilateral hand numbness 01/16/2015   Cough 02/21/2015   Cervical radiculopathy at C8 02/27/2015   Otitis, externa, infective 03/15/2015   Anxiety and depression 09/03/2015   Ulnar nerve entrapment 09/12/2016   Polypharmacy 10/24/2016   Influenza-like illness 05/13/2017   Neck pain 11/12/2018   Chronic neck pain 02/28/2019   Upper back pain 06/07/2019   DDD (degenerative disc disease), cervical 06/07/2019   Past Medical History:  Diagnosis Date   Asthma    Depression    GERD (gastroesophageal  reflux disease)    Headache    Seizure ([place]) 01/06/2019        Review of Systems     Objective:   Physical Exam Vitals reviewed.  Constitutional:      Appearance: Normal appearance.  HENT:     Head: Normocephalic.  Cardiovascular:     Rate and Rhythm: Normal rate and regular rhythm.      Pulses: Normal pulses.  Pulmonary:     Effort: Pulmonary effort is normal.     Breath sounds: Normal breath sounds.  Abdominal:     General: Bowel sounds are normal. There is no distension.     Palpations: Abdomen is soft. There is no mass.     Tenderness: There is no abdominal tenderness. There is no right CVA tenderness, left CVA tenderness, guarding or rebound.     Hernia: No hernia is present.  Musculoskeletal:     Right lower leg: No edema.     Left lower leg: No edema.  Neurological:     General: No focal deficit present.     Mental Status: She is alert and oriented to person, place, and time.  Psychiatric:        Mood and Affect: Mood normal.         . Results for orders placed or performed in visit on 10/09/20  WET PREP FOR [place], YEAST, CLUE   Specimen: Vaginal Fluid  Result Value Ref Range   MICRO NUMBER: [id number]    Specimen Quality Adequate    SOURCE: NOT GIVEN    Status FINAL    RESULT      No Trichomonas vaginalis seen. No yeast seen No clue cells seen Epithelial Cells Present  POCT URINALYSIS DIP (CLINITEK)  Result Value Ref Range   Color, UA yellow yellow   Clarity, UA clear clear   Glucose, UA negative negative mg/dL   Bilirubin, UA negative negative   Ketones, POC UA negative negative mg/dL   Spec Grav, UA 1.015 1.010 - 1.025   Blood, UA negative negative   pH, UA 7.5 5.0 - 8.0   POC PROTEIN,UA negative negative, trace   Urobilinogen, UA 0.2 0.2 or 1.0 E.U./dL   Nitrite, UA Negative Negative   Leukocytes, UA Negative Negative    Assessment & Plan:  Marland KitchenMarland KitchenSavana was seen today for pain management.  Diagnoses and all orders for this visit:  Pain management contract agreement -     DRUG MONITORING, PANEL 6 WITH CONFIRMATION, URINE -     HYDROcodone-acetaminophen (NORCO/VICODIN) 5-325 MG tablet; Take 1 tablet by mouth every 6 (six) hours as needed for up to 5 days for moderate pain. Chronic pain contract.  UTI symptoms -     POCT URINALYSIS DIP  (CLINITEK) -     Urine Culture -     WET PREP FOR TRICH, YEAST, CLUE  Chronic pain syndrome -     HYDROcodone-acetaminophen (NORCO/VICODIN) 5-325 MG tablet; Take 1 tablet by mouth every 6 (six) hours as needed for up to 5 days for moderate pain. Chronic pain contract.  Generalized abdominal pain -     HYDROcodone-acetaminophen (NORCO/VICODIN) 5-325 MG tablet; Take 1 tablet by mouth every 6 (six) hours as needed for up to 5 days for moderate pain. Chronic pain contract.  Chronic right-sided low back pain without sciatica -     HYDROcodone-acetaminophen (NORCO/VICODIN) 5-325 MG tablet; Take 1 tablet by mouth every 6 (six) hours as needed for up to 5 days for moderate pain. Chronic pain  contract.  Mid back pain on right side -     HYDROcodone-acetaminophen (NORCO/VICODIN) 5-325 MG tablet; Take 1 tablet by mouth every 6 (six) hours as needed for up to 5 days for moderate pain. Chronic pain contract.  Bloating  History of Helicobacter pylori infection  Irritable bowel syndrome with both constipation and diarrhea  Pain contract signed today for no more than 30 tablets a month.  ..PDMP reviewed during this encounter. UDS ordered today.  Pt will need follow up every 3 months due to [place] controlled substance database.   Keep follow ups with GI to continue to work up abdominal pain. Start bentyl.    UTI symptoms.  UA in office no significant findings.  Will culture.  STAT wet prep negative.  Increase hydration.  Ok to try AZO for a few days.  Follow up as needed or if symptoms persist.

## 2020-10-10 DIAGNOSIS — G8929 Other chronic pain: Secondary | ICD-10-CM | POA: Insufficient documentation

## 2020-10-10 DIAGNOSIS — R140 Abdominal distension (gaseous): Secondary | ICD-10-CM | POA: Insufficient documentation

## 2020-10-10 DIAGNOSIS — M549 Dorsalgia, unspecified: Secondary | ICD-10-CM | POA: Insufficient documentation

## 2020-10-10 DIAGNOSIS — K582 Mixed irritable bowel syndrome: Secondary | ICD-10-CM | POA: Insufficient documentation

## 2020-10-10 LAB — WET PREP FOR TRICH, YEAST, CLUE
MICRO NUMBER:: 12061275
Specimen Quality: ADEQUATE

## 2020-10-10 NOTE — Progress Notes (Signed)
No trich, yeast, clue cells seen. Wet prep normal.

## 2020-10-11 NOTE — Progress Notes (Signed)
No significant bacteria growth to suggestion urinary tract infection. If still having symptoms let me know.

## 2020-10-24 LAB — URINE CULTURE
MICRO NUMBER:: 12061583
SPECIMEN QUALITY:: ADEQUATE

## 2020-10-24 LAB — DRUG MONITORING, PANEL 6 WITH CONFIRMATION, URINE
6 Acetylmorphine: NEGATIVE ng/mL (ref <10)
Alcohol Metabolites: NEGATIVE ng/mL (ref <500)
Amphetamines: NEGATIVE ng/mL (ref <500)
Barbiturates: NEGATIVE ng/mL (ref <300)
Benzodiazepines: NEGATIVE ng/mL (ref <100)
Cocaine Metabolite: NEGATIVE ng/mL (ref <150)
Codeine: NEGATIVE ng/mL (ref <50)
Creatinine: 78.7 mg/dL (ref >=20.0)
Hydrocodone: 248 ng/mL — ABNORMAL HIGH (ref <50)
Hydromorphone: NEGATIVE ng/mL (ref <50)
Marijuana Metabolite: NEGATIVE ng/mL (ref <20)
Marijuana Metabolite: NEGATIVE ng/mL (ref <5)
Methadone Metabolite: NEGATIVE ng/mL (ref <100)
Morphine: NEGATIVE ng/mL (ref <50)
Norhydrocodone: 526 ng/mL — ABNORMAL HIGH (ref <50)
Opiates: POSITIVE ng/mL — AB (ref <100)
Oxidant: NEGATIVE ug/mL (ref <200)
Oxycodone: NEGATIVE ng/mL (ref <100)
Phencyclidine: NEGATIVE ng/mL (ref <25)
pH: 8.7 (ref 4.5–9.0)

## 2020-10-24 LAB — DM TEMPLATE

## 2020-10-25 ENCOUNTER — Ambulatory Visit (HOSPITAL_COMMUNITY): Admission: RE | Admit: 2020-10-25 | Payer: No Typology Code available for payment source | Source: Ambulatory Visit

## 2020-11-06 ENCOUNTER — Other Ambulatory Visit: Payer: Self-pay | Admitting: Physician Assistant

## 2020-11-06 DIAGNOSIS — K219 Gastro-esophageal reflux disease without esophagitis: Secondary | ICD-10-CM

## 2020-11-06 MED ORDER — IBUPROFEN 800 MG PO TABS: 800.0000 mg | ORAL_TABLET | Freq: Three times a day (TID) | ORAL | 0 refills | Status: DC | PRN

## 2020-11-06 MED ORDER — PANTOPRAZOLE SODIUM 40 MG PO TBEC
40.0000 mg | DELAYED_RELEASE_TABLET | Freq: Two times a day (BID) | ORAL | 3 refills | Status: DC
Start: 2020-11-06 — End: 2021-07-04

## 2020-11-11 ENCOUNTER — Other Ambulatory Visit: Payer: Self-pay | Admitting: Physician Assistant

## 2020-11-11 DIAGNOSIS — B009 Herpesviral infection, unspecified: Secondary | ICD-10-CM

## 2020-11-13 ENCOUNTER — Ambulatory Visit (HOSPITAL_COMMUNITY): Payer: No Typology Code available for payment source

## 2020-11-19 ENCOUNTER — Other Ambulatory Visit: Payer: Self-pay | Admitting: Physician Assistant

## 2020-11-19 DIAGNOSIS — M549 Dorsalgia, unspecified: Secondary | ICD-10-CM

## 2020-11-19 DIAGNOSIS — Z0289 Encounter for other administrative examinations: Secondary | ICD-10-CM

## 2020-11-19 DIAGNOSIS — G894 Chronic pain syndrome: Secondary | ICD-10-CM

## 2020-11-19 DIAGNOSIS — G8929 Other chronic pain: Secondary | ICD-10-CM

## 2020-11-19 DIAGNOSIS — R1084 Generalized abdominal pain: Secondary | ICD-10-CM

## 2020-11-19 MED ORDER — HYDROCODONE-ACETAMINOPHEN 5-325 MG PO TABS: 1.0000 | ORAL_TABLET | Freq: Four times a day (QID) | ORAL | 0 refills | Status: DC | PRN

## 2020-11-19 NOTE — Progress Notes (Signed)
..  PDMP reviewed during this encounter. Refilled norco On pain contract.

## 2020-11-22 ENCOUNTER — Emergency Department (HOSPITAL_BASED_OUTPATIENT_CLINIC_OR_DEPARTMENT_OTHER)
Admission: EM | Admit: 2020-11-22 | Discharge: 2020-11-22 | Disposition: A | Discharge status: Home / Self Care | Payer: PRIVATE HEALTH INSURANCE | Attending: Emergency Medicine | Admitting: Emergency Medicine

## 2020-11-22 ENCOUNTER — Emergency Department (HOSPITAL_BASED_OUTPATIENT_CLINIC_OR_DEPARTMENT_OTHER): Payer: PRIVATE HEALTH INSURANCE

## 2020-11-22 ENCOUNTER — Encounter (HOSPITAL_BASED_OUTPATIENT_CLINIC_OR_DEPARTMENT_OTHER): Payer: Self-pay | Admitting: *Deleted

## 2020-11-22 ENCOUNTER — Other Ambulatory Visit: Payer: Self-pay

## 2020-11-22 DIAGNOSIS — Z7951 Long term (current) use of inhaled steroids: Secondary | ICD-10-CM | POA: Diagnosis not present

## 2020-11-22 DIAGNOSIS — R1013 Epigastric pain: Secondary | ICD-10-CM | POA: Diagnosis present

## 2020-11-22 DIAGNOSIS — Z87891 Personal history of nicotine dependence: Secondary | ICD-10-CM | POA: Insufficient documentation

## 2020-11-22 DIAGNOSIS — J45909 Unspecified asthma, uncomplicated: Secondary | ICD-10-CM | POA: Diagnosis not present

## 2020-11-22 DIAGNOSIS — R112 Nausea with vomiting, unspecified: Secondary | ICD-10-CM | POA: Diagnosis not present

## 2020-11-22 DIAGNOSIS — R000 Tachycardia, unspecified: Secondary | ICD-10-CM | POA: Diagnosis not present

## 2020-11-22 DIAGNOSIS — R109 Unspecified abdominal pain: Secondary | ICD-10-CM

## 2020-11-22 LAB — CBC
HCT: 42.3 % (ref 36.0–46.0)
Hemoglobin: 14.5 g/dL (ref 12.0–15.0)
MCH: 32 pg (ref 26.0–34.0)
MCHC: 34.3 g/dL (ref 30.0–36.0)
MCV: 93.4 fL (ref 80.0–100.0)
Platelets: 262 10*3/uL (ref 150–400)
RBC: 4.53 MIL/uL (ref 3.87–5.11)
RDW: 12.3 % (ref 11.5–15.5)
WBC: 6.7 10*3/uL (ref 4.0–10.5)
nRBC: 0 % (ref 0.0–0.2)

## 2020-11-22 LAB — URINALYSIS, ROUTINE W REFLEX MICROSCOPIC
Bilirubin Urine: NEGATIVE
Glucose, UA: NEGATIVE mg/dL
Hgb urine dipstick: NEGATIVE
Ketones, ur: NEGATIVE mg/dL
Leukocytes,Ua: NEGATIVE
Nitrite: NEGATIVE
Protein, ur: NEGATIVE mg/dL
Specific Gravity, Urine: <1.005 — ABNORMAL LOW (ref 1.005–1.030)
pH: 6.5 (ref 5.0–8.0)

## 2020-11-22 LAB — COMPREHENSIVE METABOLIC PANEL
ALT: 13 U/L (ref 0–44)
AST: 18 U/L (ref 15–41)
Albumin: 5.2 g/dL — ABNORMAL HIGH (ref 3.5–5.0)
Alkaline Phosphatase: 34 U/L — ABNORMAL LOW (ref 38–126)
Anion gap: 10 (ref 5–15)
BUN: 7 mg/dL (ref 6–20)
CO2: 27 mmol/L (ref 22–32)
Calcium: 9.4 mg/dL (ref 8.9–10.3)
Chloride: 101 mmol/L (ref 98–111)
Creatinine, Ser: 0.66 mg/dL (ref 0.44–1.00)
GFR, Estimated: >60 mL/min (ref >60)
Glucose, Bld: 100 mg/dL — ABNORMAL HIGH (ref 70–99)
Potassium: 3.7 mmol/L (ref 3.5–5.1)
Sodium: 138 mmol/L (ref 135–145)
Total Bilirubin: 0.3 mg/dL (ref 0.3–1.2)
Total Protein: 7.5 g/dL (ref 6.5–8.1)

## 2020-11-22 LAB — PREGNANCY, URINE: Preg Test, Ur: NEGATIVE

## 2020-11-22 LAB — LIPASE, BLOOD: Lipase: 18 U/L (ref 11–51)

## 2020-11-22 MED ORDER — ONDANSETRON HCL 4 MG PO TABS: 4.0000 mg | ORAL_TABLET | Freq: Four times a day (QID) | ORAL | 0 refills | Status: DC

## 2020-11-22 MED ORDER — ALUM & MAG HYDROXIDE-SIMETH 200-200-20 MG/5ML PO SUSP
30.0000 mL | Freq: Once | ORAL | Status: AC
  Administered 2020-11-22: 30 mL via ORAL
  Filled 2020-11-22: qty 30

## 2020-11-22 MED ORDER — ONDANSETRON HCL 4 MG/2ML IJ SOLN
4.0000 mg | Freq: Once | INTRAMUSCULAR | Status: AC
Start: 2020-11-22 — End: 2020-11-22
  Administered 2020-11-22: 4 mg via INTRAVENOUS
  Filled 2020-11-22: qty 2

## 2020-11-22 MED ORDER — HYDROMORPHONE HCL 1 MG/ML IJ SOLN
0.5000 mg | Freq: Once | INTRAMUSCULAR | Status: AC
  Administered 2020-11-22: 0.5 mg via INTRAVENOUS
  Filled 2020-11-22: qty 1

## 2020-11-22 MED ORDER — MORPHINE SULFATE (PF) 4 MG/ML IV SOLN
4.0000 mg | Freq: Once | INTRAVENOUS | Status: AC
  Administered 2020-11-22: 4 mg via INTRAVENOUS
  Filled 2020-11-22: qty 1

## 2020-11-22 MED ORDER — IOHEXOL 300 MG/ML  SOLN
75.0000 mL | Freq: Once | INTRAMUSCULAR | Status: AC | PRN
  Administered 2020-11-22: 75 mL via INTRAVENOUS

## 2020-11-22 NOTE — Discharge Instructions (Addendum)
You have a benign torsed epiploic appendage.  This is an acute condition that will resolve on its own without intervention.  Please continue to take your home medicine as needed, call your pain management doctor tomorrow and let them know you will be going to your pain much quicker than normal.    If you are feeling terrible tomorrow, please do not go to work and stay at home.  If you are feeling better you can return to work.  You may take Zofran as needed every 6 hours for nausea and vomiting.  This dissolves under your tongue so you do not need to swallow it.

## 2020-11-22 NOTE — ED Triage Notes (Signed)
C/o chest pain and abd pain and back pain , 7 am Vicodin w/o relief pt sees pain management for same

## 2020-11-22 NOTE — ED Provider Notes (Signed)
[facility] EMERGENCY DEPARTMENT Provider Note   CSN: [id number] Arrival date & time: 11/22/20  1410     History Chief Complaint  Patient presents with   Abdominal Pain    Annette Cook is a 35 y.o. female.  HPI  Patient presents with 3 days of abdominal pain, nausea, vomiting.  Pain is primarily in the epigastric area and to the left upper quadrant, it radiates in a bandlike distribution.  The pain is constant, not alleviated by anything.  She has a history of similar, sees a pain specialist and takes Norco daily.  This is not helping.  She denies any fevers, chest pain, shortness of breath.    History of H. pylori infection, most recent endoscopy and colonoscopy was last year without any acute findings.  Past Medical History:  Diagnosis Date   ADHD (attention deficit hyperactivity disorder)    Anxiety    Asthma    Depression    GERD (gastroesophageal reflux disease)    Headache    Insomnia    Seizure ([place]) 01/06/2019   last Oct 2020 with weeks of memory loss    Patient Active Problem List   Diagnosis Date Noted   Chronic upper abdominal pain 10/10/2020   Mid back pain on right side 10/10/2020   Irritable bowel syndrome with both constipation and diarrhea 10/10/2020   Bloating 10/10/2020   Chronic pain syndrome 10/09/2020   Pain management contract agreement 10/09/2020   Chronic right-sided low back pain without sciatica 06/12/2020   Unintended weight loss [id number]   History of Helicobacter pylori infection 06/12/2020   Generalized abdominal pain 02/18/2020   Pill dysphagia 02/18/2020   Non-intractable vomiting 02/18/2020   Rectal bleeding 02/18/2020   Unintentional weight loss 02/18/2020   History of ERCP 02/18/2020   Black stools 07/11/2019   Leukocytes in urine 07/11/2019   Kidney stone on right side 07/11/2019   Left ovarian cyst 07/11/2019   Constipation 07/11/2019   History of cholecystectomy 03/23/2019   Drug withdrawal seizure with  delirium ([place]) 02/28/2019   Elevated blood pressure reading 02/28/2019   Gastroesophageal reflux disease 02/28/2019   Moderate episode of recurrent major depressive disorder ([place]) 02/28/2019   PTSD (post-traumatic stress disorder) 11/12/2018   Anxiety 11/12/2018   Myofascial pain syndrome 11/12/2018   Overactive bladder 01/21/2018   Adult ADHD 04/10/2017   Opioid use agreement exists 10/24/2016   Encounter for chronic pain management 10/24/2016   Takes daily multivitamins 09/12/2016   Patellofemoral pain syndrome 04/12/2015   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 03/15/2015   Acute stress reaction 07/08/2013   Generalized anxiety disorder 10/22/2012   Insomnia 10/22/2012   Panic attack 10/22/2012   ADHD (attention deficit hyperactivity disorder) 10/22/2012   HSV infection 12/02/2011    Past Surgical History:  Procedure Laterality Date   APPENDECTOMY     BILIARY STENT PLACEMENT  03/24/2019   Procedure: BILIARY STENT PLACEMENT;  Surgeon: Clarene Essex, MD;  Location: [facility];  Service: Endoscopy;;   CHOLECYSTECTOMY N/A 03/17/2019   Procedure: LAPAROSCOPIC CHOLECYSTECTOMY;  Surgeon: Ralene Ok, MD;  Location: [facility];  Service: General;  Laterality: N/A;   ERCP N/A 03/24/2019   Procedure: ENDOSCOPIC RETROGRADE CHOLANGIOPANCREATOGRAPHY (ERCP);  Surgeon: Clarene Essex, MD;  Location: [facility];  Service: Endoscopy;  Laterality: N/A;   ERCP N/A 05/17/2019   Procedure: ENDOSCOPIC RETROGRADE CHOLANGIOPANCREATOGRAPHY (ERCP);  Surgeon: Clarene Essex, MD;  Location: [facility];  Service: Endoscopy;  Laterality: N/A;  with stent removal   SPHINCTEROTOMY  03/24/2019   Procedure: SPHINCTEROTOMY;  Surgeon: Clarene Essex, MD;  Location: [facility] END[facility]COPY;  Service: Endoscopy;;   STENT REMOVAL  05/17/2019   Procedure: STENT REMOVAL;  Surgeon: Clarene Essex, MD;  Location: [facility];  Service: Endoscopy;;   TONSILLECTOMY AND ADENOIDECTOMY     WISDOM TOOTH EXTRACTION       OB History      Gravida  3   Para  3   Term  3   Preterm      AB      Living  3      SAB      IAB      Ectopic      Multiple  0   Live Births  3           Family History  Problem Relation Age of Onset   Hypertension Mother    Stroke Mother    Colon cancer Neg Hx    Esophageal cancer Neg Hx    Inflammatory bowel disease Neg Hx    Liver disease Neg Hx    Pancreatic cancer Neg Hx    Rectal cancer Neg Hx    Stomach cancer Neg Hx     Social History   Tobacco Use   Smoking status: Former    Packs/day: 1.00    Years: 7.00    Pack years: 7.00    Types: Cigarettes    Quit date: 10/22/2004    Years since quitting: 16.0   Smokeless tobacco: Never  Vaping Use   Vaping Use: Every day   Substances: Nicotine   Devices: Started 3 years ago  Substance Use Topics   Alcohol use: Yes    Comment: socially   Drug use: No    Home Medications Prior to Admission medications   Medication Sig Start Date End Date Taking? Authorizing Provider  albuterol (VENTOLIN HFA) 108 (90 Base) MCG/ACT inhaler TAKE 2 PUFFS BY MOUTH EVERY 6 HOURS AS NEEDED Patient not taking: Reported on 10/09/2020 11/28/19   [provider]  atomoxetine (STRATTERA) 80 MG capsule TAKE 1 CAPSULE BY MOUTH EVERY DAY 09/26/20   Breeback, Jade L, PA-C  clonazePAM (KLONOPIN) 0.5 MG tablet Take as needed for panic attacks no more than once a day. Patient not taking: Reported on 10/09/2020 07/01/19   Donella Stade, PA-C  ferrous sulfate (FER-IN-SOL) 75 (15 Fe) MG/ML SOLN Take 2 mLs (30 mg of iron total) by mouth daily. Patient not taking: Reported on 10/09/2020 06/11/20   Iran Planas L, PA-C  fluticasone (FLONASE) 50 MCG/ACT nasal spray Place 2 sprays into both nostrils daily. 02/03/20   Breeback, Royetta Car, PA-C  HYDROcodone-acetaminophen (NORCO/VICODIN) 5-325 MG tablet Take 1 tablet by mouth every 6 (six) hours as needed for moderate pain. Chronic pain contract. 11/19/20 12/19/20  Donella Stade, PA-C   hydrocortisone (ANUSOL-HC) 25 MG suppository Place 1 suppository (25 mg total) rectally every 12 (twelve) hours. 04/10/20   Mansouraty, Telford Nab., MD  hydrOXYzine (ATARAX/VISTARIL) 50 MG tablet 1 tablet in the morning, 2 in the evening 04/12/20   Early, Coralee Pesa, NP  ibuprofen (ADVIL) 800 MG tablet Take 1 tablet (800 mg total) by mouth every 8 (eight) hours as needed. 11/06/20   Breeback, Royetta Car, PA-C  levocetirizine (XYZAL) 5 MG tablet Take 1 tablet (5 mg total) by mouth every evening. 07/19/20   Donella Stade, PA-C  LINZESS 145 MCG CAPS capsule TAKE 1 CAPSULE BY MOUTH DAILY BEFORE BREAKFAST. 07/02/20   Mansouraty, Telford Nab.,  MD  Melatonin 5 MG CAPS Take 5 mg by mouth at bedtime.    [provider]  mometasone (NASONEX) 50 MCG/ACT nasal spray One spray in each nostril twice a day, use left hand for right nostril, and right hand for left nostril.  Please dispense one bottle. 04/30/20   Breeback, Luvenia Starch L, PA-C  nystatin (MYCOSTATIN) 100000 UNIT/ML suspension Take 5 mLs (500,000 Units total) by mouth 4 (four) times daily. Swish for 30 seconds and spit out. 04/16/20   Breeback, Jade L, PA-C  ondansetron (ZOFRAN-ODT) 8 MG disintegrating tablet Take 1 tablet (8 mg total) by mouth every 8 (eight) hours as needed for nausea. 06/28/20   Hali Marry, MD  Oxcarbazepine (TRILEPTAL) 300 MG tablet Take 300 mg by mouth 2 (two) times daily. 02/11/19   [provider]  pantoprazole (PROTONIX) 40 MG tablet Take 1 tablet (40 mg total) by mouth 2 (two) times daily. 11/06/20   Breeback, Jade L, PA-C  pregabalin (LYRICA) 50 MG capsule Take 1 capsule (50 mg total) by mouth 2 (two) times daily. Patient not taking: Reported on 10/09/2020 06/12/20   Iran Planas L, PA-C  QUEtiapine (SEROQUEL) 50 MG tablet Take by mouth. 50 mg in the morning, 150 mg in the evening    [provider]  sucralfate (CARAFATE) 1 GM/10ML suspension Take 10 mLs (1 g total) by mouth 2 (two) times daily. 04/10/20    Mansouraty, Telford Nab., MD  tiZANidine (ZANAFLEX) 2 MG tablet Take 2 tablets (4 mg total) by mouth at bedtime. 11/28/19   Breeback, Jade L, PA-C  tiZANidine (ZANAFLEX) 4 MG capsule TAKE 1 CAPSULE BY MOUTH 3 TIMES DAILY AS NEEDED FOR MUSCLE SPASMS. 09/14/20   Breeback, Jade L, PA-C  valACYclovir (VALTREX) 500 MG tablet Take 1 tablet (500 mg total) by mouth daily. 11/19/20   Donella Stade, PA-C    Allergies    Patient has no known allergies.  Review of Systems   Review of Systems  Constitutional:  Negative for fever.  Respiratory:  Negative for shortness of breath.   Cardiovascular:  Negative for chest pain.  Gastrointestinal:  Positive for abdominal pain, nausea and vomiting.  Genitourinary:  Negative for dysuria, flank pain and hematuria.   Physical Exam Updated Vital Signs BP (!) 157/115 (BP Location: Left Arm)   Pulse (!) 108   Temp 98.5 F (36.9 C) (Oral)   Resp 18   Ht 5' (1.524 m)   Wt 46.9 kg   LMP 11/19/2020   SpO2 100%   BMI 20.21 kg/m   Physical Exam Vitals and nursing note reviewed. Exam conducted with a chaperone present.  Constitutional:      Appearance: Normal appearance.  HENT:     Head: Normocephalic and atraumatic.  Eyes:     General: No scleral icterus.       Right eye: No discharge.        Left eye: No discharge.     Extraocular Movements: Extraocular movements intact.     Pupils: Pupils are equal, round, and reactive to light.  Cardiovascular:     Rate and Rhythm: Normal rate and regular rhythm.     Pulses: Normal pulses.     Heart sounds: Normal heart sounds. No murmur heard.   No friction rub. No gallop.  Pulmonary:     Effort: Pulmonary effort is normal. No respiratory distress.     Breath sounds: Normal breath sounds.  Abdominal:     General: Abdomen is flat. Bowel sounds  are normal. There is no distension.     Palpations: Abdomen is soft.     Tenderness: There is abdominal tenderness in the epigastric area and left upper quadrant. There is  guarding.  Skin:    General: Skin is warm and dry.     Coloration: Skin is not jaundiced.  Neurological:     Mental Status: She is alert. Mental status is at baseline.     Coordination: Coordination normal.   ED Results / Procedures / Treatments   Labs (all labs ordered are listed, but only abnormal results are displayed) Labs Reviewed  COMPREHENSIVE METABOLIC PANEL - Abnormal; Notable for the following components:      Result Value   Glucose, Bld 100 (*)    Albumin 5.2 (*)    Alkaline Phosphatase 34 (*)    All other components within normal limits  URINALYSIS, ROUTINE W REFLEX MICROSCOPIC - Abnormal; Notable for the following components:   Specific Gravity, Urine <1.005 (*)    All other components within normal limits  LIPASE, BLOOD  CBC  PREGNANCY, URINE    EKG EKG Interpretation  Date/Time:  Thursday November 22 2020 14:27:55 EDT Ventricular Rate:  94 PR Interval:  120 QRS Duration: 76 QT Interval:  328 QTC Calculation: 410 R Axis:   89 Text Interpretation: Normal sinus rhythm with sinus arrhythmia Right atrial enlargement Cannot rule out Anterior infarct , age undetermined Abnormal ECG Confirmed by Lennice Sites ([id number]) on 11/22/2020 3:05:55 PM  Radiology No results found.  Procedures Procedures   Medications Ordered in ED Medications  ondansetron (ZOFRAN) injection 4 mg (has no administration in time range)  morphine 4 MG/ML injection 4 mg (has no administration in time range)    ED Course  I have reviewed the triage vital signs and the nursing notes.  Pertinent labs & imaging results that were available during my care of the patient were reviewed by me and considered in my medical decision making (see chart for details).  Clinical Course as of 11/22/20 1915  Thu Nov 22, 2020  1801 Comprehensive metabolic panel(!) No electrolyte derangement, no AKI [HS]  1845 Lipase, blood Not consistent with pancreatitis [HS]  1845 CBC No leukocytosis, no anemia [HS]   1846 Pregnancy, urine Doubt ectopic [HS]  1846 Urinalysis, Routine w reflex microscopic Urine, Clean Catch(!) No UTI [HS]    Clinical Course User Index [HS] Sherrill Raring, PA-C   MDM Rules/Calculators/A&P                           Patient is mildly tachycardic, but otherwise her vitals are stable.  Patient is status postcholecystectomy and appendectomy, she has acute onset upper abdominal pain.  Could be pancreatitis or gastritis, I doubt any pelvic etiology given the pain is upper abdominal.  Patient is very tender on exam with guarding, will get a CT even though laboratory work-up is unrevealing.  CT shows benign torsed epiploic appendage.  This does not require further work-up, and is consistent with her presentation.  We will discharge her with her home [place], have her follow-up with her pain medicine doctor to prescribe additional medicine as needed.  She is tearful, but nontoxic-appearing.  Her pain has improved while here in the ED and she is no longer guarding the abdomen.    Discussed with patient, she is appropriate for outpatient discharge with pain medicine.  Return precautions were discussed and agreed upon.  Final Clinical Impression(s) / ED Diagnoses  Final diagnoses:  None    Rx / DC Orders ED Discharge Orders     None        Sherrill Raring, PA-C 11/22/20 [place], [place], DO 11/22/20 2000

## 2020-11-23 ENCOUNTER — Telehealth: Payer: Self-pay | Admitting: General Practice

## 2020-11-23 ENCOUNTER — Other Ambulatory Visit: Payer: Self-pay | Admitting: Physician Assistant

## 2020-11-23 MED ORDER — HYDROMORPHONE HCL 2 MG PO TABS: 2.0000 mg | ORAL_TABLET | Freq: Four times a day (QID) | ORAL | 0 refills | Status: AC | PRN

## 2020-11-23 NOTE — Telephone Encounter (Signed)
Transition Care Management Follow-up Telephone Call Date of discharge and from where: 11/22/20 from [facility] How have you been since you were released from the hospital? Patient stated that she is still in a lot of pain. I offered to make her an appointment to see one of the providers as Luvenia Starch is not in the office but patient stated that she didn't want to schedule one at this time. Any questions or concerns? No  Items Reviewed: Did the pt receive and understand the discharge instructions provided? Yes  Medications obtained and verified? Yes  Other? No  Any new allergies since your discharge? No  Dietary orders reviewed? Yes Do you have support at home? Yes   Home Care and Equipment/Supplies: Were home health services ordered? no   Functional Questionnaire: (I = Independent and D = Dependent) ADLs: I  Bathing/Dressing- I  Meal Prep- I  Eating- I  Maintaining continence- I  Transferring/Ambulation- I  Managing Meds- I  Follow up appointments reviewed:  PCP Hospital f/u appt confirmed? No  Patient didn't want to schedule one at this time. [facility] f/u appt confirmed? No  Are transportation arrangements needed? No  If their condition worsens, is the pt aware to call PCP or go to the Emergency Dept.? Yes Was the patient provided with contact information for the PCP's office or ED? Yes Was to pt encouraged to call back with questions or concerns? Yes

## 2020-12-06 ENCOUNTER — Other Ambulatory Visit: Payer: Self-pay

## 2020-12-06 ENCOUNTER — Ambulatory Visit (HOSPITAL_COMMUNITY)
Admission: RE | Admit: 2020-12-06 | Discharge: 2020-12-06 | Disposition: A | Discharge status: Home / Self Care | Payer: No Typology Code available for payment source | Source: Ambulatory Visit | Attending: Gastroenterology | Admitting: Gastroenterology

## 2020-12-06 DIAGNOSIS — R110 Nausea: Secondary | ICD-10-CM | POA: Diagnosis present

## 2020-12-06 DIAGNOSIS — R634 Abnormal weight loss: Secondary | ICD-10-CM

## 2020-12-24 ENCOUNTER — Other Ambulatory Visit: Payer: Self-pay | Admitting: Neurology

## 2020-12-24 DIAGNOSIS — M545 Low back pain, unspecified: Secondary | ICD-10-CM

## 2020-12-24 DIAGNOSIS — G8929 Other chronic pain: Secondary | ICD-10-CM

## 2020-12-24 DIAGNOSIS — Z0289 Encounter for other administrative examinations: Secondary | ICD-10-CM

## 2020-12-24 DIAGNOSIS — R1084 Generalized abdominal pain: Secondary | ICD-10-CM

## 2020-12-24 DIAGNOSIS — M549 Dorsalgia, unspecified: Secondary | ICD-10-CM

## 2020-12-24 DIAGNOSIS — G894 Chronic pain syndrome: Secondary | ICD-10-CM

## 2020-12-24 NOTE — Telephone Encounter (Signed)
Patient called requesting refill of Norco. It looks like this was last prescribed 11/19/2020 #30 no refills. It was no longer on medication list. No pain contract on file. Please advise?

## 2020-12-26 MED ORDER — HYDROCODONE-ACETAMINOPHEN 5-325 MG PO TABS: 1.0000 | ORAL_TABLET | Freq: Four times a day (QID) | ORAL | 0 refills | Status: DC | PRN

## 2020-12-26 NOTE — Telephone Encounter (Signed)
Pain contract 7/8.  Marland KitchenMarland KitchenPDMP reviewed during this encounter.

## 2020-12-27 ENCOUNTER — Telehealth: Payer: No Typology Code available for payment source | Admitting: Physician Assistant

## 2020-12-27 ENCOUNTER — Telehealth: Payer: Self-pay | Admitting: Physician Assistant

## 2020-12-27 DIAGNOSIS — U071 COVID-19: Secondary | ICD-10-CM | POA: Diagnosis not present

## 2020-12-27 MED ORDER — HYDROCOD POLST-CPM POLST ER 10-8 MG/5ML PO SUER: 5.0000 mL | Freq: Two times a day (BID) | ORAL | 0 refills | Status: DC | PRN

## 2020-12-27 MED ORDER — ALBUTEROL SULFATE HFA 108 (90 BASE) MCG/ACT IN AERS: 2.0000 | INHALATION_SPRAY | Freq: Four times a day (QID) | RESPIRATORY_TRACT | 0 refills | Status: DC | PRN

## 2020-12-27 NOTE — Progress Notes (Signed)
Virtual Visit Consent   Annette Cook, you are scheduled for a virtual visit with a [facility] provider today.     Just as with appointments in the office, your consent must be obtained to participate.  Your consent will be active for this visit and any virtual visit you may have with one of our providers in the next 365 days.     If you have a MyChart account, a copy of this consent can be sent to you electronically.  All virtual visits are billed to your insurance company just like a traditional visit in the office.    As this is a virtual visit, video technology does not allow for your provider to perform a traditional examination.  This may limit your provider's ability to fully assess your condition.  If your provider identifies any concerns that need to be evaluated in person or the need to arrange testing (such as labs, EKG, etc.), we will make arrangements to do so.     Although advances in technology are sophisticated, we cannot ensure that it will always work on either your end or our end.  If the connection with a video visit is poor, the visit may have to be switched to a telephone visit.  With either a video or telephone visit, we are not always able to ensure that we have a secure connection.     I need to obtain your verbal consent now.   Are you willing to proceed with your visit today?    Annette Cook has provided verbal consent on 12/27/2020 for a virtual visit (video or telephone).   Annette Cook, [place]   Date: 12/27/2020 11:04 AM   Virtual Visit via Video Note   I, Annette Cook, connected with  Annette Cook  [name], 1985/08/10) on 12/27/20 at 11:30 AM EDT by a video-enabled telemedicine application and verified that I am speaking with the correct person using two identifiers.  Location: Patient: Virtual Visit Location Patient: Home Provider: Virtual Visit Location Provider: Home Office   I discussed the limitations of evaluation and management by  telemedicine and the availability of in person appointments. The patient expressed understanding and agreed to proceed.    History of Present Illness: Annette Cook is a 35 y.o. who identifies as a female who was assigned female at birth, and is being seen today for COVID-19. Endorses symptoms starting Sunday evening with general malaise extending into Monday. Notes feeling worse so had a rapid COVID test which was negative. Was tested again on Tuesday and was positive. Notes fatigue, chills, aches, headache. Today feeling much worse than prior days with worsening cough and sore throat with worsening aches. Denies chest pain or SOB.   HPI: HPI  Problems:  Patient Active Problem List   Diagnosis Date Noted   Chronic upper abdominal pain 10/10/2020   Mid back pain on right side 10/10/2020   Irritable bowel syndrome with both constipation and diarrhea 10/10/2020   Bloating 10/10/2020   Chronic pain syndrome 10/09/2020   Pain management contract agreement 10/09/2020   Chronic right-sided low back pain without sciatica 06/12/2020   Unintended weight loss [id number]   History of Helicobacter pylori infection 06/12/2020   Generalized abdominal pain 02/18/2020   Pill dysphagia 02/18/2020   Non-intractable vomiting 02/18/2020   Rectal bleeding 02/18/2020   Unintentional weight loss 02/18/2020   History of ERCP 02/18/2020   Black stools 07/11/2019   Leukocytes in urine 07/11/2019   Kidney  stone on right side 07/11/2019   Left ovarian cyst 07/11/2019   Constipation 07/11/2019   History of cholecystectomy 03/23/2019   Drug withdrawal seizure with delirium ([place]) 02/28/2019   Elevated blood pressure reading 02/28/2019   Gastroesophageal reflux disease 02/28/2019   Moderate episode of recurrent major depressive disorder ([place]) 02/28/2019   PTSD (post-traumatic stress disorder) 11/12/2018   Anxiety 11/12/2018   Myofascial pain syndrome 11/12/2018   Overactive bladder 01/21/2018   Adult ADHD  04/10/2017   Opioid use agreement exists 10/24/2016   Encounter for chronic pain management 10/24/2016   Takes daily multivitamins 09/12/2016   Patellofemoral pain syndrome 04/12/2015   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 03/15/2015   Acute stress reaction 07/08/2013   Generalized anxiety disorder 10/22/2012   Insomnia 10/22/2012   Panic attack 10/22/2012   ADHD (attention deficit hyperactivity disorder) 10/22/2012   HSV infection 12/02/2011    Allergies: No Known Allergies Medications:  Current Outpatient Medications:    albuterol (VENTOLIN HFA) 108 (90 Base) MCG/ACT inhaler, Inhale 2 puffs into the lungs every 6 (six) hours as needed for wheezing or shortness of breath., Disp: 8 g, Rfl: 0   atomoxetine (STRATTERA) 80 MG capsule, TAKE 1 CAPSULE BY MOUTH EVERY DAY, Disp: 30 capsule, Rfl: 3   fluticasone (FLONASE) 50 MCG/ACT nasal spray, Place 2 sprays into both nostrils daily., Disp: 16 g, Rfl: 5   HYDROcodone-acetaminophen (NORCO/VICODIN) 5-325 MG tablet, Take 1 tablet by mouth every 6 (six) hours as needed for moderate pain. Chronic pain contract., Disp: 30 tablet, Rfl: 0   hydrOXYzine (ATARAX/VISTARIL) 50 MG tablet, 1 tablet in the morning, 2 in the evening, Disp: 360 tablet, Rfl: 0   ibuprofen (ADVIL) 800 MG tablet, Take 1 tablet (800 mg total) by mouth every 8 (eight) hours as needed., Disp: 90 tablet, Rfl: 0   levocetirizine (XYZAL) 5 MG tablet, Take 1 tablet (5 mg total) by mouth every evening., Disp: 90 tablet, Rfl: 1   LINZESS 145 MCG CAPS capsule, TAKE 1 CAPSULE BY MOUTH DAILY BEFORE BREAKFAST., Disp: 30 capsule, Rfl: 2   Melatonin 5 MG CAPS, Take 5 mg by mouth at bedtime., Disp: , Rfl:    Oxcarbazepine (TRILEPTAL) 300 MG tablet, Take 300 mg by mouth 2 (two) times daily., Disp: , Rfl:    pantoprazole (PROTONIX) 40 MG tablet, Take 1 tablet (40 mg total) by mouth 2 (two) times daily., Disp: 180 tablet, Rfl: 3   QUEtiapine (SEROQUEL) 50 MG tablet, Take by mouth. 50 mg  in the morning, 150 mg in the evening, Disp: , Rfl:    tiZANidine (ZANAFLEX) 4 MG capsule, TAKE 1 CAPSULE BY MOUTH 3 TIMES DAILY AS NEEDED FOR MUSCLE SPASMS., Disp: 270 capsule, Rfl: 1   valACYclovir (VALTREX) 500 MG tablet, Take 1 tablet (500 mg total) by mouth daily., Disp: 90 tablet, Rfl: 0  Observations/Objective: Patient is well-developed, well-nourished in no acute distress.  Resting comfortably at home.  Head is normocephalic, atraumatic.  No labored breathing. Speech is clear and coherent with logical content.  Patient is alert and oriented at baseline.   Assessment and Plan: 1. COVID-19 - albuterol (VENTOLIN HFA) 108 (90 Base) MCG/ACT inhaler; Inhale 2 puffs into the lungs every 6 (six) hours as needed for wheezing or shortness of breath.  Dispense: 8 g; Refill: 0 Milder symptoms. Lower risk of complications - risk score of 1. No indication for antiviral at present time as risk of ADR from medication > likely benefit. Supportive measures, OTC medications and  Vitamin regimen reviewed. Rx Albuterol. Discussed cough medications. She is currently taking Tessalon without improvement. Offered script for promethazine-dm but she notes her pharmacy had a sign on the door noting they were out of that medication when she went to get OTC meds. She is requesting a codeine-containing cough syrup. Discussed as noted on our site we do not prescribe controlled medications through this platform. Patient endorses she would not have signed up for the visit if she knew that was the case. Has appt with her PCP tomorrow to try to get a cough medication. Discussed I would send them a copy of my note to see if they want to reach out to her beforehand. Patient enrolled in [place] monitoring program through [facility]. Strict ER precautions reviewed.   Follow Up Instructions: I discussed the assessment and treatment plan with the patient. The patient was provided an opportunity to ask questions and all were answered.  The patient agreed with the plan and demonstrated an understanding of the instructions.  A copy of instructions were sent to the patient via MyChart.  The patient was advised to call back or seek an in-person evaluation if the symptoms worsen or if the condition fails to improve as anticipated.  Time:  I spent 10 minutes with the patient via telehealth technology discussing the above problems/concerns.    Annette Rio, PA-C

## 2020-12-27 NOTE — Patient Instructions (Signed)
Annette Cook, thank you for joining Leeanne Rio, PA-C for today's virtual visit.  While this provider is not your primary care provider (PCP), if your PCP is located in our provider database this encounter information will be shared with them immediately following your visit.  Consent: (Patient) Annette Cook provided verbal consent for this virtual visit at the beginning of the encounter.  Current Medications:  Current Outpatient Medications:    albuterol (VENTOLIN HFA) 108 (90 Base) MCG/ACT inhaler, TAKE 2 PUFFS BY MOUTH EVERY 6 HOURS AS NEEDED (Patient not taking: Reported on 10/09/2020), Disp: , Rfl:    atomoxetine (STRATTERA) 80 MG capsule, TAKE 1 CAPSULE BY MOUTH EVERY DAY, Disp: 30 capsule, Rfl: 3   clonazePAM (KLONOPIN) 0.5 MG tablet, Take as needed for panic attacks no more than once a day. (Patient not taking: Reported on 10/09/2020), Disp: 10 tablet, Rfl: 0   ferrous sulfate (FER-IN-SOL) 75 (15 Fe) MG/ML SOLN, Take 2 mLs (30 mg of iron total) by mouth daily. (Patient not taking: Reported on 10/09/2020), Disp: 180 mL, Rfl: 3   fluticasone (FLONASE) 50 MCG/ACT nasal spray, Place 2 sprays into both nostrils daily., Disp: 16 g, Rfl: 5   HYDROcodone-acetaminophen (NORCO/VICODIN) 5-325 MG tablet, Take 1 tablet by mouth every 6 (six) hours as needed for moderate pain. Chronic pain contract., Disp: 30 tablet, Rfl: 0   hydrocortisone (ANUSOL-HC) 25 MG suppository, Place 1 suppository (25 mg total) rectally every 12 (twelve) hours., Disp: 12 suppository, Rfl: 1   hydrOXYzine (ATARAX/VISTARIL) 50 MG tablet, 1 tablet in the morning, 2 in the evening, Disp: 360 tablet, Rfl: 0   ibuprofen (ADVIL) 800 MG tablet, Take 1 tablet (800 mg total) by mouth every 8 (eight) hours as needed., Disp: 90 tablet, Rfl: 0   levocetirizine (XYZAL) 5 MG tablet, Take 1 tablet (5 mg total) by mouth every evening., Disp: 90 tablet, Rfl: 1   LINZESS 145 MCG CAPS capsule, TAKE 1 CAPSULE BY MOUTH DAILY BEFORE  BREAKFAST., Disp: 30 capsule, Rfl: 2   Melatonin 5 MG CAPS, Take 5 mg by mouth at bedtime., Disp: , Rfl:    mometasone (NASONEX) 50 MCG/ACT nasal spray, One spray in each nostril twice a day, use left hand for right nostril, and right hand for left nostril.  Please dispense one bottle., Disp: 1 g, Rfl: 1   nystatin (MYCOSTATIN) 100000 UNIT/ML suspension, Take 5 mLs (500,000 Units total) by mouth 4 (four) times daily. Swish for 30 seconds and spit out., Disp: 473 mL, Rfl: 0   ondansetron (ZOFRAN) 4 MG tablet, Take 1 tablet (4 mg total) by mouth every 6 (six) hours., Disp: 12 tablet, Rfl: 0   ondansetron (ZOFRAN-ODT) 8 MG disintegrating tablet, Take 1 tablet (8 mg total) by mouth every 8 (eight) hours as needed for nausea., Disp: 20 tablet, Rfl: 3   Oxcarbazepine (TRILEPTAL) 300 MG tablet, Take 300 mg by mouth 2 (two) times daily., Disp: , Rfl:    pantoprazole (PROTONIX) 40 MG tablet, Take 1 tablet (40 mg total) by mouth 2 (two) times daily., Disp: 180 tablet, Rfl: 3   pregabalin (LYRICA) 50 MG capsule, Take 1 capsule (50 mg total) by mouth 2 (two) times daily. (Patient not taking: Reported on 10/09/2020), Disp: 60 capsule, Rfl: 2   QUEtiapine (SEROQUEL) 50 MG tablet, Take by mouth. 50 mg in the morning, 150 mg in the evening, Disp: , Rfl:    sucralfate (CARAFATE) 1 GM/10ML suspension, Take 10 mLs (1 g total) by mouth 2 (  two) times daily., Disp: 420 mL, Rfl: 1   tiZANidine (ZANAFLEX) 2 MG tablet, Take 2 tablets (4 mg total) by mouth at bedtime., Disp: 30 tablet, Rfl: 1   tiZANidine (ZANAFLEX) 4 MG capsule, TAKE 1 CAPSULE BY MOUTH 3 TIMES DAILY AS NEEDED FOR MUSCLE SPASMS., Disp: 270 capsule, Rfl: 1   valACYclovir (VALTREX) 500 MG tablet, Take 1 tablet (500 mg total) by mouth daily., Disp: 90 tablet, Rfl: 0   Medications ordered in this encounter:  No orders of the defined types were placed in this encounter.    *If you need refills on other medications prior to your next appointment, please contact  your pharmacy*  Follow-Up: Call back or seek an in-person evaluation if the symptoms worsen or if the condition fails to improve as anticipated.  Other Instructions Please keep well-hydrated and get plenty of rest. Start a saline nasal rinse to flush out your nasal passages. You can use plain Mucinex to help thin congestion. If you have a humidifier, running in the bedroom at night. I want you to start OTC vitamin D3 1000 units daily, vitamin C 1000 mg daily, and a zinc supplement. Please take prescribed medications as directed.  You have been enrolled in a MyChart symptom monitoring program. Please answer these questions daily so we can keep track of how you are doing.  You were to quarantine for 5 days from onset of your symptoms.  After day 5, if you have had no fever and you are feeling better, you can end quarantine but need to mask for an additional 5 days. After day 5 if you have a fever or are having significant symptoms, please quarantine for full 10 days.  If you note any worsening of symptoms, any significant shortness of breath or any chest pain, please seek ER evaluation ASAP.  Please do not delay care!  COVID-19: What to Do if You Are Sick If you test positive and are an older adult or someone who is at high risk of getting very sick from COVID-19, treatment may be available. Contact a healthcare provider right away after a positive test to determine if you are eligible, even if your symptoms are mild right now. You can also visit a Test to Treat location and, if eligible, receive a prescription from a provider. Don't delay: Treatment must be started within the first few days to be effective. If you have a fever, cough, or other symptoms, you might have COVID-19. Most people have mild illness and are able to recover at home. If you are sick: Keep track of your symptoms. If you have an emergency warning sign (including trouble breathing), call 911. Steps to help prevent the  spread of COVID-19 if you are sick If you are sick with COVID-19 or think you might have COVID-19, follow the steps below to care for yourself and to help protect other people in your home and community. Stay home except to get medical care Stay home. Most people with COVID-19 have mild illness and can recover at home without medical care. Do not leave your home, except to get medical care. Do not visit public areas and do not go to places where you are unable to wear a mask. Take care of yourself. Get rest and stay hydrated. Take over-the-counter medicines, such as acetaminophen, to help you feel better. Stay in touch with your doctor. Call before you get medical care. Be sure to get care if you have trouble breathing, or have any other emergency  warning signs, or if you think it is an emergency. Avoid public transportation, ride-sharing, or taxis if possible. Get tested If you have symptoms of COVID-19, get tested. While waiting for test results, stay away from others, including staying apart from those living in your household. Get tested as soon as possible after your symptoms start. Treatments may be available for people with COVID-19 who are at risk for becoming very sick. Don't delay: Treatment must be started early to be effective--some treatments must begin within 5 days of your first symptoms. Contact your healthcare provider right away if your test result is positive to determine if you are eligible. Self-tests are one of several options for testing for the virus that causes COVID-19 and may be more convenient than laboratory-based tests and point-of-care tests. Ask your healthcare provider or your local health department if you need help interpreting your test results. You can visit your state, tribal, local, and territorial health department's website to look for the latest local information on testing sites. Separate yourself from other people As much as possible, stay in a specific room  and away from other people and pets in your home. If possible, you should use a separate bathroom. If you need to be around other people or animals in or outside of the home, wear a well-fitting mask. Tell your close contacts that they may have been exposed to COVID-19. An infected person can spread COVID-19 starting 48 hours (or 2 days) before the person has any symptoms or tests positive. By letting your close contacts know they may have been exposed to COVID-19, you are helping to protect everyone. See COVID-19 and Animals if you have questions about pets. If you are diagnosed with COVID-19, someone from the health department may call you. Answer the call to slow the spread. Monitor your symptoms Symptoms of COVID-19 include fever, cough, or other symptoms. Follow care instructions from your healthcare provider and local health department. Your local health authorities may give instructions on checking your symptoms and reporting information. When to seek emergency medical attention Look for emergency warning signs* for COVID-19. If someone is showing any of these signs, seek emergency medical care immediately: Trouble breathing Persistent pain or pressure in the chest New confusion Inability to wake or stay awake Pale, gray, or blue-colored skin, lips, or nail beds, depending on skin tone *This list is not all possible symptoms. Please call your medical provider for any other symptoms that are severe or concerning to you. Call 911 or call ahead to your local emergency facility: Notify the operator that you are seeking care for someone who has or may have COVID-19. Call ahead before visiting your doctor Call ahead. Many medical visits for routine care are being postponed or done by phone or telemedicine. If you have a medical appointment that cannot be postponed, call your doctor's office, and tell them you have or may have COVID-19. This will help the office protect themselves and other  patients. If you are sick, wear a well-fitting mask You should wear a mask if you must be around other people or animals, including pets (even at home). Wear a mask with the best fit, protection, and comfort for you. You don't need to wear the mask if you are alone. If you can't put on a mask (because of trouble breathing, for example), cover your coughs and sneezes in some other way. Try to stay at least 6 feet away from other people. This will help protect the people around you.  Masks should not be placed on young children under age 27 years, anyone who has trouble breathing, or anyone who is not able to remove the mask without help. Cover your coughs and sneezes Cover your mouth and nose with a tissue when you cough or sneeze. Throw away used tissues in a lined trash can. Immediately wash your hands with soap and water for at least 20 seconds. If soap and water are not available, clean your hands with an alcohol-based hand sanitizer that contains at least 60% alcohol. Clean your hands often Wash your hands often with soap and water for at least 20 seconds. This is especially important after blowing your nose, coughing, or sneezing; going to the bathroom; and before eating or preparing food. Use hand sanitizer if soap and water are not available. Use an alcohol-based hand sanitizer with at least 60% alcohol, covering all surfaces of your hands and rubbing them together until they feel dry. Soap and water are the best option, especially if hands are visibly dirty. Avoid touching your eyes, nose, and mouth with unwashed hands. Handwashing Tips Avoid sharing personal household items Do not share dishes, drinking glasses, cups, eating utensils, towels, or bedding with other people in your home. Wash these items thoroughly after using them with soap and water or put in the dishwasher. Clean surfaces in your home regularly Clean and disinfect high-touch surfaces (for example, doorknobs, tables,  handles, light switches, and countertops) in your "sick room" and bathroom. In shared spaces, you should clean and disinfect surfaces and items after each use by the person who is ill. If you are sick and cannot clean, a caregiver or other person should only clean and disinfect the area around you (such as your bedroom and bathroom) on an as needed basis. Your caregiver/other person should wait as long as possible (at least several hours) and wear a mask before entering, cleaning, and disinfecting shared spaces that you use. Clean and disinfect areas that may have blood, stool, or body fluids on them. Use household cleaners and disinfectants. Clean visible dirty surfaces with household cleaners containing soap or detergent. Then, use a household disinfectant. Use a product from [employer] List N: Disinfectants for Coronavirus ([id number]). Be sure to follow the instructions on the label to ensure safe and effective use of the product. Many products recommend keeping the surface wet with a disinfectant for a certain period of time (look at "contact time" on the product label). You may also need to wear personal protective equipment, such as gloves, depending on the directions on the product label. Immediately after disinfecting, wash your hands with soap and water for 20 seconds. For completed guidance on cleaning and disinfecting your home, visit Complete Disinfection Guidance. Take steps to improve ventilation at home Improve ventilation (air flow) at home to help prevent from spreading COVID-19 to other people in your household. Clear out COVID-19 virus particles in the air by opening windows, using air filters, and turning on fans in your home. Use this interactive tool to learn how to improve air flow in your home. When you can be around others after being sick with COVID-19 Deciding when you can be around others is different for different situations. Find out when you can safely end home isolation. For  any additional questions about your care, contact your healthcare provider or state or local health department. 07/03/2020 Content source: [facility] for Immunization and Respiratory Diseases ([facility]), Division of Viral Diseases This information is not intended to replace advice given  to you by your health care provider. Make sure you discuss any questions you have with your health care provider. Document Revised: 08/16/2020 Document Reviewed: 08/16/2020 Elsevier Patient Education  2022 [employer].      If you have been instructed to have an in-person evaluation today at a local Urgent Care facility, please use the link below. It will take you to a list of all of our available [facility], including address, phone number and hours of operation. Please do not delay care.  [facility]  If you or a family member do not have a primary care provider, use the link below to schedule a visit and establish care. When you choose a [facility] primary care physician or advanced practice provider, you gain a long-term partner in health. Find a Primary Care Provider  Learn more about [facility]'s in-office and virtual care options: [facility]

## 2020-12-27 NOTE — Telephone Encounter (Signed)
Pt informed of RX.  T. Leib Elahi, CMA 

## 2020-12-27 NOTE — Telephone Encounter (Signed)
Reviewed virtual visit from today the PA that saw her reached out to me.  So go ahead and send over the cough medication.

## 2020-12-27 NOTE — Telephone Encounter (Signed)
Patient called for an appt today in our office but we did not have any available to offer her so she was seen today through [facility] on the [facility] website. She would like to know if you can call her in [place]? Please advise patient if this can be done for her. Thank you

## 2020-12-27 NOTE — Addendum Note (Signed)
Addended by: Beatrice Lecher D on: 12/27/2020 12:52 PM   Modules accepted: Orders

## 2020-12-28 ENCOUNTER — Telehealth: Payer: No Typology Code available for payment source | Admitting: Physician Assistant

## 2021-01-10 ENCOUNTER — Ambulatory Visit (INDEPENDENT_AMBULATORY_CARE_PROVIDER_SITE_OTHER): Payer: No Typology Code available for payment source | Admitting: Physician Assistant

## 2021-01-10 ENCOUNTER — Other Ambulatory Visit: Payer: Self-pay

## 2021-01-10 VITALS — BP 146/90 | HR 120 | Ht 60.0 in | Wt 104.0 lb

## 2021-01-10 DIAGNOSIS — Z0289 Encounter for other administrative examinations: Secondary | ICD-10-CM

## 2021-01-10 DIAGNOSIS — M549 Dorsalgia, unspecified: Secondary | ICD-10-CM

## 2021-01-10 DIAGNOSIS — L509 Urticaria, unspecified: Secondary | ICD-10-CM

## 2021-01-10 DIAGNOSIS — R1084 Generalized abdominal pain: Secondary | ICD-10-CM

## 2021-01-10 DIAGNOSIS — M545 Low back pain, unspecified: Secondary | ICD-10-CM

## 2021-01-10 DIAGNOSIS — K30 Functional dyspepsia: Secondary | ICD-10-CM | POA: Diagnosis not present

## 2021-01-10 DIAGNOSIS — G8929 Other chronic pain: Secondary | ICD-10-CM

## 2021-01-10 DIAGNOSIS — G894 Chronic pain syndrome: Secondary | ICD-10-CM | POA: Diagnosis not present

## 2021-01-10 MED ORDER — METOCLOPRAMIDE HCL 10 MG PO TABS
ORAL_TABLET | ORAL | 2 refills | Status: DC
Start: 2021-01-10 — End: 2021-04-29

## 2021-01-10 NOTE — Progress Notes (Signed)
Subjective:    Patient ID: Annette Cook, female    DOB: 1986-01-31, 35 y.o.   MRN: [id number]  HPI Pt is a 35 yo female who presents to the clinic for norco refill due to generalized abdominal pain that is intermittent. She has been seen by GI and has had h.pylori gastritis that has cleared. She recently did have barium swallow that showed some delayed gastric emptying but nothing else. She was referred to speech and nutrition. She is having 3 days per week burst of 10/10 abdominal pain. Norco helps with this. She cannot take NSAIDs due to hx of gastritis and GI bleed.   She is having new hives intermittently as well. She would like allergy referral.  She takes xzyal daily.  Unclear any triggers. No SOB, tongue or lip swelling. Not in relationship to food, environment.   .. 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 03/15/2015   Patellofemoral pain syndrome 04/12/2015   Takes daily multivitamins 09/12/2016   Opioid use agreement exists 10/24/2016   Encounter for chronic pain management 10/24/2016   Adult ADHD 04/10/2017   Overactive bladder 01/21/2018   PTSD (post-traumatic stress disorder) 11/12/2018   Anxiety 11/12/2018   Myofascial pain syndrome 11/12/2018   Drug withdrawal seizure with delirium ([place]) 02/28/2019   Elevated blood pressure reading 02/28/2019   Gastroesophageal reflux disease 02/28/2019   Moderate episode of recurrent major depressive disorder ([place]) 02/28/2019   History of cholecystectomy 03/23/2019   Black stools 07/11/2019   Leukocytes in urine 07/11/2019   Kidney stone on right side 07/11/2019   Left ovarian cyst 07/11/2019   Constipation 07/11/2019   Generalized abdominal pain  02/18/2020   Pill dysphagia 02/18/2020   Non-intractable vomiting 02/18/2020   Rectal bleeding 02/18/2020   Unintentional weight loss 02/18/2020   History of ERCP 02/18/2020   Chronic right-sided low back pain without sciatica 06/12/2020   Unintended weight loss 78/93/8101   History of Helicobacter pylori infection 06/12/2020   Chronic pain syndrome 10/09/2020   Pain management contract agreement 10/09/2020   Chronic upper abdominal pain 10/10/2020   Mid back pain on right side 10/10/2020   Irritable bowel syndrome with both constipation and diarrhea 10/10/2020   Bloating 10/10/2020   Hives 01/14/2021   Delayed gastric emptying 01/14/2021   Resolved Ambulatory Problems    Diagnosis Date Noted   Supervision of normal IUP (intrauterine pregnancy) in multigravida 11/09/2011   Rh negative status during pregnancy 02/03/2012   Marginal placenta previa 02/03/2012   Pain in throat 04/18/2013   Encounter for supervision of other normal pregnancy in first trimester 12/07/2013   Anxiety during pregnancy in second trimester, antepartum 01/13/2014   Active labor 06/01/2014   Vaginal delivery 06/01/2014   Shoulder dystocia, delivered, current hospitalization 06/01/2014   Bilateral hand numbness 01/16/2015   Cough 02/21/2015   Cervical radiculopathy at C8 02/27/2015   Otitis, externa, infective 03/15/2015   Anxiety and depression 09/03/2015   Ulnar nerve entrapment 09/12/2016   Polypharmacy 10/24/2016   Influenza-like illness 05/13/2017   Neck pain 11/12/2018   Chronic neck pain 02/28/2019   Upper back pain 06/07/2019   DDD (degenerative disc disease), cervical 06/07/2019   Past Medical History:  Diagnosis Date   Asthma  Depression    GERD (gastroesophageal reflux disease)    Headache    Seizure ([place]) 01/06/2019    Review of Systems See HPI.     Objective:   Physical Exam Vitals reviewed.  Constitutional:      Appearance: Normal appearance.  HENT:     Head:  Normocephalic and atraumatic.  Cardiovascular:     Rate and Rhythm: Normal rate and regular rhythm.     Pulses: Normal pulses.     Heart sounds: Normal heart sounds.  Pulmonary:     Effort: Pulmonary effort is normal.     Breath sounds: Normal breath sounds.  Abdominal:     General: Bowel sounds are normal. There is no distension.     Palpations: Abdomen is soft.     Tenderness: There is no abdominal tenderness. There is no right CVA tenderness, left CVA tenderness or guarding.  Musculoskeletal:     Right lower leg: No edema.     Left lower leg: No edema.  Neurological:     General: No focal deficit present.     Mental Status: She is alert and oriented to person, place, and time.  Psychiatric:        Mood and Affect: Mood normal.          Assessment & Plan:  Marland KitchenMarland KitchenDiagnoses and all orders for this visit:  Generalized abdominal pain -     HYDROcodone-acetaminophen (NORCO/VICODIN) 5-325 MG tablet; Take 1 tablet by mouth every 6 (six) hours as needed for moderate pain. Chronic pain contract.  Pain management contract agreement -     HYDROcodone-acetaminophen (NORCO/VICODIN) 5-325 MG tablet; Take 1 tablet by mouth every 6 (six) hours as needed for moderate pain. Chronic pain contract.  Hives -     Ambulatory referral to Allergy  Delayed gastric emptying -     metoCLOPramide (REGLAN) 10 MG tablet; 1 tab PO TID before meals.  Chronic pain syndrome -     HYDROcodone-acetaminophen (NORCO/VICODIN) 5-325 MG tablet; Take 1 tablet by mouth every 6 (six) hours as needed for moderate pain. Chronic pain contract.  Chronic right-sided low back pain without sciatica -     HYDROcodone-acetaminophen (NORCO/VICODIN) 5-325 MG tablet; Take 1 tablet by mouth every 6 (six) hours as needed for moderate pain. Chronic pain contract.  Mid back pain on right side -     HYDROcodone-acetaminophen (NORCO/VICODIN) 5-325 MG tablet; Take 1 tablet by mouth every 6 (six) hours as needed for moderate pain.  Chronic pain contract.   ..PDMP reviewed during this encounter. 9/19 last refill.  No overt concerns.  Up to date pain contract.  UDS up to date.  Will ok refill for October.  Follow up every 3 months by [place] controlled substance laws.   Made referral to allergy for hives. Continue to keep diary to trace trigger. Bendaryl as needed. Discussed red flag signs and symptoms.  Continue to use only as needed.  Epipen to use only for red flag signs or symptoms.  Delayed gastric emptying. Eat small meals and chew very well. Added reglan to try. Follow up in 3 months.

## 2021-01-11 ENCOUNTER — Ambulatory Visit: Payer: No Typology Code available for payment source | Admitting: Physician Assistant

## 2021-01-14 ENCOUNTER — Encounter: Payer: Self-pay | Admitting: Physician Assistant

## 2021-01-14 DIAGNOSIS — K30 Functional dyspepsia: Secondary | ICD-10-CM | POA: Insufficient documentation

## 2021-01-14 DIAGNOSIS — L509 Urticaria, unspecified: Secondary | ICD-10-CM | POA: Insufficient documentation

## 2021-01-14 MED ORDER — EPINEPHRINE 0.3 MG/0.3ML IJ SOAJ: 0.3000 mg | Freq: Once | INTRAMUSCULAR | 0 refills | Status: AC

## 2021-01-14 MED ORDER — HYDROCODONE-ACETAMINOPHEN 5-325 MG PO TABS: 1.0000 | ORAL_TABLET | Freq: Four times a day (QID) | ORAL | 0 refills | Status: DC | PRN

## 2021-01-17 ENCOUNTER — Telehealth: Payer: Self-pay

## 2021-01-17 NOTE — Telephone Encounter (Signed)
Medication: HYDROcodone-acetaminophen (NORCO/VICODIN) 5-325 MG tablet Prior authorization submitted via CoverMyMeds on 01/17/2021 [place] submission pending

## 2021-01-17 NOTE — Telephone Encounter (Signed)
Medication: HYDROcodone-acetaminophen (NORCO/VICODIN) 5-325 MG tablet Per CoverMyMeds, prior authorization is not required for this medication.

## 2021-01-31 ENCOUNTER — Other Ambulatory Visit: Payer: Self-pay

## 2021-01-31 DIAGNOSIS — J302 Other seasonal allergic rhinitis: Secondary | ICD-10-CM

## 2021-01-31 DIAGNOSIS — R0982 Postnasal drip: Secondary | ICD-10-CM

## 2021-01-31 MED ORDER — FLUTICASONE PROPIONATE 50 MCG/ACT NA SUSP: 2.0000 | Freq: Every day | NASAL | 5 refills | Status: DC

## 2021-02-02 ENCOUNTER — Other Ambulatory Visit: Payer: Self-pay | Admitting: Physician Assistant

## 2021-02-02 DIAGNOSIS — F900 Attention-deficit hyperactivity disorder, predominantly inattentive type: Secondary | ICD-10-CM

## 2021-02-04 NOTE — Telephone Encounter (Signed)
Last written 09/26/2020 #30 with 3 refills Last appt 01/10/2021

## 2021-02-08 ENCOUNTER — Ambulatory Visit: Payer: No Typology Code available for payment source | Admitting: Registered"

## 2021-02-21 ENCOUNTER — Encounter (HOSPITAL_BASED_OUTPATIENT_CLINIC_OR_DEPARTMENT_OTHER): Payer: Self-pay

## 2021-02-21 ENCOUNTER — Other Ambulatory Visit: Payer: Self-pay

## 2021-02-21 ENCOUNTER — Emergency Department (HOSPITAL_BASED_OUTPATIENT_CLINIC_OR_DEPARTMENT_OTHER)
Admission: EM | Admit: 2021-02-21 | Discharge: 2021-02-21 | Disposition: A | Discharge status: Home / Self Care | Payer: PRIVATE HEALTH INSURANCE | Attending: Emergency Medicine | Admitting: Emergency Medicine

## 2021-02-21 ENCOUNTER — Emergency Department (HOSPITAL_BASED_OUTPATIENT_CLINIC_OR_DEPARTMENT_OTHER): Payer: PRIVATE HEALTH INSURANCE

## 2021-02-21 DIAGNOSIS — R1084 Generalized abdominal pain: Secondary | ICD-10-CM

## 2021-02-21 DIAGNOSIS — K219 Gastro-esophageal reflux disease without esophagitis: Secondary | ICD-10-CM | POA: Diagnosis not present

## 2021-02-21 DIAGNOSIS — R110 Nausea: Secondary | ICD-10-CM | POA: Diagnosis not present

## 2021-02-21 DIAGNOSIS — Z87891 Personal history of nicotine dependence: Secondary | ICD-10-CM | POA: Insufficient documentation

## 2021-02-21 DIAGNOSIS — J45909 Unspecified asthma, uncomplicated: Secondary | ICD-10-CM | POA: Insufficient documentation

## 2021-02-21 LAB — CBC WITH DIFFERENTIAL/PLATELET
Abs Immature Granulocytes: 0.01 10*3/uL (ref 0.00–0.07)
Basophils Absolute: 0 10*3/uL (ref 0.0–0.1)
Basophils Relative: 0 %
Eosinophils Absolute: 0.1 10*3/uL (ref 0.0–0.5)
Eosinophils Relative: 1 %
HCT: 39.1 % (ref 36.0–46.0)
Hemoglobin: 13.1 g/dL (ref 12.0–15.0)
Immature Granulocytes: 0 %
Lymphocytes Relative: 41 %
Lymphs Abs: 2.5 10*3/uL (ref 0.7–4.0)
MCH: 31.2 pg (ref 26.0–34.0)
MCHC: 33.5 g/dL (ref 30.0–36.0)
MCV: 93.1 fL (ref 80.0–100.0)
Monocytes Absolute: 0.5 10*3/uL (ref 0.1–1.0)
Monocytes Relative: 8 %
Neutro Abs: 3 10*3/uL (ref 1.7–7.7)
Neutrophils Relative %: 50 %
Platelets: 222 10*3/uL (ref 150–400)
RBC: 4.2 MIL/uL (ref 3.87–5.11)
RDW: 12.7 % (ref 11.5–15.5)
WBC: 6.2 10*3/uL (ref 4.0–10.5)
nRBC: 0 % (ref 0.0–0.2)

## 2021-02-21 LAB — COMPREHENSIVE METABOLIC PANEL
ALT: 14 U/L (ref 0–44)
AST: 17 U/L (ref 15–41)
Albumin: 4.7 g/dL (ref 3.5–5.0)
Alkaline Phosphatase: 36 U/L — ABNORMAL LOW (ref 38–126)
Anion gap: 9 (ref 5–15)
BUN: 7 mg/dL (ref 6–20)
CO2: 27 mmol/L (ref 22–32)
Calcium: 9.2 mg/dL (ref 8.9–10.3)
Chloride: 101 mmol/L (ref 98–111)
Creatinine, Ser: 0.59 mg/dL (ref 0.44–1.00)
GFR, Estimated: >60 mL/min (ref >60)
Glucose, Bld: 102 mg/dL — ABNORMAL HIGH (ref 70–99)
Potassium: 3.4 mmol/L — ABNORMAL LOW (ref 3.5–5.1)
Sodium: 137 mmol/L (ref 135–145)
Total Bilirubin: 0.6 mg/dL (ref 0.3–1.2)
Total Protein: 7 g/dL (ref 6.5–8.1)

## 2021-02-21 LAB — URINALYSIS, ROUTINE W REFLEX MICROSCOPIC
Bilirubin Urine: NEGATIVE
Glucose, UA: NEGATIVE mg/dL
Hgb urine dipstick: NEGATIVE
Ketones, ur: NEGATIVE mg/dL
Leukocytes,Ua: NEGATIVE
Nitrite: NEGATIVE
Protein, ur: NEGATIVE mg/dL
Specific Gravity, Urine: 1.01 (ref 1.005–1.030)
pH: 7 (ref 5.0–8.0)

## 2021-02-21 LAB — PREGNANCY, URINE: Preg Test, Ur: NEGATIVE

## 2021-02-21 LAB — LIPASE, BLOOD: Lipase: 26 U/L (ref 11–51)

## 2021-02-21 MED ORDER — ONDANSETRON 4 MG PO TBDP
4.0000 mg | ORAL_TABLET | Freq: Once | ORAL | Status: AC
  Administered 2021-02-21: 4 mg via ORAL
  Filled 2021-02-21: qty 1

## 2021-02-21 MED ORDER — MORPHINE SULFATE (PF) 4 MG/ML IV SOLN
4.0000 mg | Freq: Once | INTRAVENOUS | Status: AC
  Administered 2021-02-21: 4 mg via INTRAVENOUS
  Filled 2021-02-21: qty 1

## 2021-02-21 MED ORDER — MORPHINE SULFATE 15 MG PO TABS: 7.5000 mg | ORAL_TABLET | ORAL | 0 refills | Status: DC | PRN

## 2021-02-21 MED ORDER — IOHEXOL 300 MG/ML  SOLN
75.0000 mL | Freq: Once | INTRAMUSCULAR | Status: AC | PRN
  Administered 2021-02-21: 75 mL via INTRAVENOUS

## 2021-02-21 MED ORDER — SODIUM CHLORIDE 0.9 % IV BOLUS
1000.0000 mL | Freq: Once | INTRAVENOUS | Status: AC
  Administered 2021-02-21: 1000 mL via INTRAVENOUS

## 2021-02-21 MED ORDER — ONDANSETRON 4 MG PO TBDP: ORAL_TABLET | ORAL | 0 refills | Status: DC

## 2021-02-21 NOTE — Discharge Instructions (Signed)
Follow up with your doctor tomorrow.  Return for worsening pain, fever, inability to eat or drink.

## 2021-02-21 NOTE — ED Triage Notes (Signed)
Pt c/o feeling like she is "having contractions" since 0100. Severe cramping in abdomen, nausea. Last BM yesterday. States pain radiating to chest.

## 2021-02-21 NOTE — ED Notes (Signed)
Patient transported to CT 

## 2021-02-21 NOTE — ED Provider Notes (Signed)
[facility] EMERGENCY DEPARTMENT Provider Note   CSN: [id number] Arrival date & time: 02/21/21  1729     History Chief Complaint  Patient presents with   Abdominal Pain    Annette Cook is a 35 y.o. female.  35 yo F with a with a chief complaints of diffuse abdominal discomfort.  This woke her from sleep about 1 AM.  Has been fairly constant with some episodic back discomfort.  She denies fevers or chills has had some nausea but no vomiting.  Has not really been able to eat or drink today.  No constipation or diarrhea.  Has had pain like this before and was diagnosed with epiploic appendagitis.  The history is provided by the patient.  Abdominal Pain Pain location:  Generalized Pain quality: cramping   Pain quality: not aching   Pain radiates to:  Back Pain severity:  Moderate Onset quality:  Gradual Duration:  16 hours Timing:  Constant Progression:  Unchanged Chronicity:  New Relieved by:  Nothing Worsened by:  Nothing Ineffective treatments:  None tried Associated symptoms: nausea   Associated symptoms: no chest pain, no chills, no dysuria, no fever, no shortness of breath and no vomiting       Past Medical History:  Diagnosis Date   ADHD (attention deficit hyperactivity disorder)    Anxiety    Asthma    Depression    GERD (gastroesophageal reflux disease)    Headache    Insomnia    Seizure ([place]) 01/06/2019   last Oct 2020 with weeks of memory loss    Patient Active Problem List   Diagnosis Date Noted   Hives 01/14/2021   Delayed gastric emptying 01/14/2021   Chronic upper abdominal pain 10/10/2020   Mid back pain on right side 10/10/2020   Irritable bowel syndrome with both constipation and diarrhea 10/10/2020   Bloating 10/10/2020   Chronic pain syndrome 10/09/2020   Pain management contract agreement 10/09/2020   Chronic right-sided low back pain without sciatica 06/12/2020   Unintended weight loss 36/62/9476   History of Helicobacter  pylori infection 06/12/2020   Generalized abdominal pain 02/18/2020   Pill dysphagia 02/18/2020   Non-intractable vomiting 02/18/2020   Rectal bleeding 02/18/2020   Unintentional weight loss 02/18/2020   History of ERCP 02/18/2020   Black stools 07/11/2019   Leukocytes in urine 07/11/2019   Kidney stone on right side 07/11/2019   Left ovarian cyst 07/11/2019   Constipation 07/11/2019   History of cholecystectomy 03/23/2019   Drug withdrawal seizure with delirium ([place]) 02/28/2019   Elevated blood pressure reading 02/28/2019   Gastroesophageal reflux disease 02/28/2019   Moderate episode of recurrent major depressive disorder ([place]) 02/28/2019   PTSD (post-traumatic stress disorder) 11/12/2018   Anxiety 11/12/2018   Myofascial pain syndrome 11/12/2018   Overactive bladder 01/21/2018   Adult ADHD 04/10/2017   Opioid use agreement exists 10/24/2016   Encounter for chronic pain management 10/24/2016   Takes daily multivitamins 09/12/2016   Patellofemoral pain syndrome 04/12/2015   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 03/15/2015   Acute stress reaction 07/08/2013   Generalized anxiety disorder 10/22/2012   Insomnia 10/22/2012   Panic attack 10/22/2012   ADHD (attention deficit hyperactivity disorder) 10/22/2012   HSV infection 12/02/2011    Past Surgical History:  Procedure Laterality Date   APPENDECTOMY     BILIARY STENT PLACEMENT  03/24/2019   Procedure: BILIARY STENT PLACEMENT;  Surgeon: Clarene Essex, MD;  Location: [facility] ENDOSCOPY;  Service: Endoscopy;;  CHOLECYSTECTOMY N/A 03/17/2019   Procedure: LAPAROSCOPIC CHOLECYSTECTOMY;  Surgeon: Ralene Ok, MD;  Location: [facility];  Service: General;  Laterality: N/A;   ERCP N/A 03/24/2019   Procedure: ENDOSCOPIC RETROGRADE CHOLANGIOPANCREATOGRAPHY (ERCP);  Surgeon: Clarene Essex, MD;  Location: [facility];  Service: Endoscopy;  Laterality: N/A;   ERCP N/A 05/17/2019   Procedure: ENDOSCOPIC RETROGRADE  CHOLANGIOPANCREATOGRAPHY (ERCP);  Surgeon: Clarene Essex, MD;  Location: [facility] ENDOSCOPY;  Service: Endoscopy;  Laterality: N/A;  with stent removal   SPHINCTEROTOMY  03/24/2019   Procedure: SPHINCTEROTOMY;  Surgeon: Clarene Essex, MD;  [facility] ENDOSCOPY;  Service: Endoscopy;;   STENT REMOVAL  05/17/2019   Procedure: STENT REMOVAL;  Surgeon: Clarene Essex, MD;  Location: [facility] ENDOSCOPY;  Service: Endoscopy;;   TONSILLECTOMY AND ADENOIDECTOMY     WISDOM TOOTH EXTRACTION       OB History     Gravida  3   Para  3   Term  3   Preterm      AB      Living  3      SAB      IAB      Ectopic      Multiple  0   Live Births  3           Family History  Problem Relation Age of Onset   Hypertension Mother    Stroke Mother    Colon cancer Neg Hx    Esophageal cancer Neg Hx    Inflammatory bowel disease Neg Hx    Liver disease Neg Hx    Pancreatic cancer Neg Hx    Rectal cancer Neg Hx    Stomach cancer Neg Hx     Social History   Tobacco Use   Smoking status: Former    Packs/day: 1.00    Years: 7.00    Pack years: 7.00    Types: Cigarettes    Quit date: 10/22/2004    Years since quitting: 16.3   Smokeless tobacco: Never  Vaping Use   Vaping Use: Every day   Substances: Nicotine   Devices: Started 3 years ago  Substance Use Topics   Alcohol use: Yes    Comment: socially   Drug use: No    Home Medications Prior to Admission medications   Medication Sig Start Date End Date Taking? Authorizing Provider  morphine (MSIR) 15 MG tablet Take 0.5 tablets (7.5 mg total) by mouth every 4 (four) hours as needed for severe pain. 02/21/21  Yes Deno Etienne, DO  ondansetron (ZOFRAN ODT) 4 MG disintegrating tablet 4mg  ODT q4 hours prn nausea/vomit 02/21/21  Yes Deno Etienne, DO  albuterol (VENTOLIN HFA) 108 (90 Base) MCG/ACT inhaler Inhale 2 puffs into the lungs every 6 (six) hours as needed for wheezing or shortness of breath. 12/27/20   Brunetta Jeans, PA-C  atomoxetine  (STRATTERA) 80 MG capsule TAKE 1 CAPSULE BY MOUTH EVERY DAY 02/04/21   Breeback, Jade L, PA-C  fluticasone (FLONASE) 50 MCG/ACT nasal spray Place 2 sprays into both nostrils daily. 01/31/21   Breeback, Royetta Car, PA-C  HYDROcodone-acetaminophen (NORCO/VICODIN) 5-325 MG tablet Take 1 tablet by mouth every 6 (six) hours as needed for moderate pain. Chronic pain contract. 01/27/21 02/26/21  Donella Stade, PA-C  hydrOXYzine (ATARAX/VISTARIL) 50 MG tablet 1 tablet in the morning, 2 in the evening 04/12/20   Early, Coralee Pesa, NP  levocetirizine (XYZAL) 5 MG tablet Take 1 tablet (5 mg total) by mouth every evening. 07/19/20   Iran Planas  L, PA-C  LINZESS 145 MCG CAPS capsule TAKE 1 CAPSULE BY MOUTH DAILY BEFORE BREAKFAST. 07/02/20   Mansouraty, Telford Nab., MD  Melatonin 5 MG CAPS Take 5 mg by mouth at bedtime.    [provider]  metoCLOPramide (REGLAN) 10 MG tablet 1 tab PO TID before meals. 01/10/21   Breeback, Royetta Car, PA-C  Oxcarbazepine (TRILEPTAL) 300 MG tablet Take 300 mg by mouth 2 (two) times daily. 02/11/19   [provider]  pantoprazole (PROTONIX) 40 MG tablet Take 1 tablet (40 mg total) by mouth 2 (two) times daily. 11/06/20   Breeback, Jade L, PA-C  QUEtiapine (SEROQUEL) 50 MG tablet Take by mouth. 50 mg in the morning, 150 mg in the evening    [provider]  tiZANidine (ZANAFLEX) 4 MG capsule TAKE 1 CAPSULE BY MOUTH 3 TIMES DAILY AS NEEDED FOR MUSCLE SPASMS. 09/14/20   Breeback, Jade L, PA-C  valACYclovir (VALTREX) 500 MG tablet Take 1 tablet (500 mg total) by mouth daily. 11/19/20   Donella Stade, PA-C    Allergies    Patient has no known allergies.  Review of Systems   Review of Systems  Constitutional:  Negative for chills and fever.  HENT:  Negative for congestion and rhinorrhea.   Eyes:  Negative for redness and visual disturbance.  Respiratory:  Negative for shortness of breath and wheezing.   Cardiovascular:  Negative for chest pain and palpitations.   Gastrointestinal:  Positive for abdominal pain and nausea. Negative for vomiting.  Genitourinary:  Negative for dysuria and urgency.  Musculoskeletal:  Negative for arthralgias and myalgias.  Skin:  Negative for pallor and wound.  Neurological:  Negative for dizziness and headaches.   Physical Exam Updated Vital Signs BP (!) 155/95 (BP Location: Right Arm)   Pulse (!) 118   Temp 98.2 F (36.8 C) (Oral)   Resp 20   Ht 5' (1.524 m)   Wt 44.9 kg   LMP  (LMP Unknown)   SpO2 100%   BMI 19.33 kg/m   Physical Exam Vitals and nursing note reviewed.  Constitutional:      General: She is not in acute distress.    Appearance: She is well-developed. She is not diaphoretic.  HENT:     Head: Normocephalic and atraumatic.  Eyes:     Pupils: Pupils are equal, round, and reactive to light.  Cardiovascular:     Rate and Rhythm: Normal rate and regular rhythm.     Heart sounds: No murmur heard.   No friction rub. No gallop.  Pulmonary:     Effort: Pulmonary effort is normal.     Breath sounds: No wheezing or rales.  Abdominal:     General: There is no distension.     Palpations: Abdomen is soft.     Tenderness: There is abdominal tenderness.     Comments: Mild diffuse abdominal tenderness, with guarding  Musculoskeletal:        General: No tenderness.     Cervical back: Normal range of motion and neck supple.  Skin:    General: Skin is warm and dry.  Neurological:     Mental Status: She is alert and oriented to person, place, and time.  Psychiatric:        Behavior: Behavior normal.    ED Results / Procedures / Treatments   Labs (all labs ordered are listed, but only abnormal results are displayed) Labs Reviewed  COMPREHENSIVE METABOLIC PANEL - Abnormal; Notable for the following components:  Result Value   Potassium 3.4 (*)    Glucose, Bld 102 (*)    Alkaline Phosphatase 36 (*)    All other components within normal limits  CBC WITH DIFFERENTIAL/PLATELET  LIPASE,  BLOOD  URINALYSIS, ROUTINE W REFLEX MICROSCOPIC  PREGNANCY, URINE    EKG EKG Interpretation  Date/Time:  Thursday February 21 2021 17:39:50 EST Ventricular Rate:  115 PR Interval:  167 QRS Duration: 86 QT Interval:  281 QTC Calculation: 389 R Axis:   79 Text Interpretation: Sinus tachycardia Right atrial enlargement Probable LVH with secondary repol abnrm No significant change since last tracing Confirmed by Deno Etienne [phone]) on 02/21/2021 7:10:19 PM  Radiology CT ABDOMEN PELVIS W CONTRAST  Result Date: 02/21/2021 CLINICAL DATA:  Lower abdominal pain prior cholecystectomy and appendectomy. EXAM: CT ABDOMEN AND PELVIS WITH CONTRAST TECHNIQUE: Multidetector CT imaging of the abdomen and pelvis was performed using the standard protocol following bolus administration of intravenous contrast. CONTRAST:  78mL OMNIPAQUE IOHEXOL 300 MG/ML  SOLN COMPARISON:  November 22, 2020 FINDINGS: Lower chest: No acute abnormality. Partially visualized bilateral breast prostheses. Hepatobiliary: Stable 10 mm hyperenhancing focus along the lateral aspect of the left lobe of the liver on image 17/3 previously characterized as a benign hepatic hemangioma. No suspicious hepatic lesion. Gallbladder surgically absent. Similar prominence of the biliary tree is favored reservoir effect post cholecystectomy. Pancreas: No pancreatic ductal dilation or evidence of acute inflammation. Spleen: Within normal limits. Adrenals/Urinary Tract: Adrenal glands are unremarkable. Kidneys are normal, without renal calculi, solid enhancing lesion, or hydronephrosis. Bladder is unremarkable. Stomach/Bowel: No enteric contrast was administered. Stomach is distended with ingested material. Mild wall thickening of the gastric antrum. No pathologic dilation of large or small bowel. The appendix is surgically absent. Terminal ileum is unremarkable. No evidence of acute bowel inflammation. Scattered colonic diverticulosis without findings of acute  diverticulitis. Vascular/Lymphatic: No abdominal aortic aneurysm. No pathologically enlarged abdominal or pelvic lymph nodes. Reproductive: Uterus and bilateral adnexa are unremarkable. Other: Similar appearance of the small central fat density focus anterior to the transverse colon on image 61/5 reflecting prior fat inflammation. No significant abdominopelvic free fluid. Musculoskeletal: No acute or significant osseous findings. IMPRESSION: 1. Mild wall thickening of the gastric antrum may represent under distension versus gastritis. 2. Otherwise, no acute findings in the abdomen or pelvis. 3. Scattered colonic diverticulosis without findings of acute diverticulitis. Electronically Signed   By: Dahlia Bailiff M.D.   On: 02/21/2021 19:33    Procedures Procedures   Medications Ordered in ED Medications  morphine 4 MG/ML injection 4 mg (4 mg Intravenous Given 02/21/21 1814)  ondansetron (ZOFRAN-ODT) disintegrating tablet 4 mg (4 mg Oral Given 02/21/21 1813)  sodium chloride 0.9 % bolus 1,000 mL (1,000 mLs Intravenous New Bag/Given 02/21/21 1934)  iohexol (OMNIPAQUE) 300 MG/ML solution 75 mL (75 mLs Intravenous Contrast Given 02/21/21 1904)    ED Course  I have reviewed the triage vital signs and the nursing notes.  Pertinent labs & imaging results that were available during my care of the patient were reviewed by me and considered in my medical decision making (see chart for details).    MDM Rules/Calculators/A&P                           35 yo F with a chief complaints of abdominal pain.  This been going on for about 16 hours now.  Diffuse abdomen.  History of common bile duct stone has had her gallbladder  and appendix removed had a tummy tuck done previously.  We will obtain a laboratory evaluation bolus of IV fluids reassess.  Patient feeling a bit better on reassessment.  Blood work is reassuring.  Still persistently tachycardic with a heart rate into the 120s.  Will obtain a CT  scan.  CT with likely enteritis.  No other concerning finding.  Will discharge home.  PCP follow-up.  7:55 PM:  I have discussed the diagnosis/risks/treatment options with the patient and believe the pt to be eligible for discharge home to follow-up with PCP. We also discussed returning to the ED immediately if new or worsening sx occur. We discussed the sx which are most concerning (e.g., sudden worsening pain, fever, inability to tolerate by mouth) that necessitate immediate return. Medications administered to the patient during their visit and any new prescriptions provided to the patient are listed below.  Medications given during this visit Medications  morphine 4 MG/ML injection 4 mg (4 mg Intravenous Given 02/21/21 1814)  ondansetron (ZOFRAN-ODT) disintegrating tablet 4 mg (4 mg Oral Given 02/21/21 1813)  sodium chloride 0.9 % bolus 1,000 mL (1,000 mLs Intravenous New Bag/Given 02/21/21 1934)  iohexol (OMNIPAQUE) 300 MG/ML solution 75 mL (75 mLs Intravenous Contrast Given 02/21/21 1904)     The patient appears reasonably screen and/or stabilized for discharge and I doubt any other medical condition or other [facility] requiring further screening, evaluation, or treatment in the ED at this time prior to discharge.   Final Clinical Impression(s) / ED Diagnoses Final diagnoses:  Generalized abdominal pain    Rx / DC Orders ED Discharge Orders          Ordered    ondansetron (ZOFRAN ODT) 4 MG disintegrating tablet        02/21/21 1938    morphine (MSIR) 15 MG tablet  Every 4 hours PRN        02/21/21 1938             Deno Etienne, DO 02/21/21 1955

## 2021-02-22 ENCOUNTER — Encounter: Payer: Self-pay | Admitting: Family Medicine

## 2021-02-22 ENCOUNTER — Telehealth (INDEPENDENT_AMBULATORY_CARE_PROVIDER_SITE_OTHER): Payer: No Typology Code available for payment source | Admitting: Family Medicine

## 2021-02-22 ENCOUNTER — Telehealth: Payer: Self-pay | Admitting: General Practice

## 2021-02-22 DIAGNOSIS — R1084 Generalized abdominal pain: Secondary | ICD-10-CM

## 2021-02-22 NOTE — Telephone Encounter (Signed)
Transition Care Management Follow-up Telephone Call Date of discharge and from where: 02/21/21 from [facility] How have you been since you were released from the hospital? Patient has OV scheduled with Caleen Jobs, NP for today, 02/22/21. Any questions or concerns? No

## 2021-02-22 NOTE — Progress Notes (Signed)
Virtual Video Visit via MyChart Note  I connected with  Annette Cook on 02/22/21 at  1:20 PM EST by the video enabled telemedicine application for MyChart, and verified that I am speaking with the correct person using two identifiers.   I introduced myself as a Designer, jewellery with the practice. We discussed the limitations of evaluation and management by telemedicine and the availability of in person appointments. The patient expressed understanding and agreed to proceed.  Participating parties in this visit include: The patient and the nurse practitioner listed.  The patient is: in the car I am: In the office - [facility]  Subjective:    CC:  Chief Complaint  Patient presents with   Abdominal Pain    X - 1 day    HPI: Annette Cook is a 35 y.o. year old female presenting today via [place] today for hospital follow-up for abdominal pain.  02/21/21 ED: woke up at 1am with abdominal pain/cramping, nausea. hx of common bile duct stone, gallbladder and appendix removal, tummy tuck. CT with enteritis. Labs unremarkable. Received analgesics, antiemetics, IV fluids, discharged home with return precautions   02/22/2021 virtual visit: She reports some improvement but still not feeling 100%. She has been using the zofran for nausea, but has not vomited. Has had some looser stools occasionally and has noted small amounts of blood with wiping, but reports history of hemorrhoids. States she had already informed the ED of this. She complains of some generalized abdominal discomfort and poor appetite - hasn't tried eating yet, but is sipping on fluids. States she did have some chills, but has not had a fever, chest pain, shortness of breath, headaches, rash, etc. No known sick contacts, but states she does work in [employer]. No unusual or high risk food intake the day prior.     Past medical history, Surgical history, Family history not pertinant except as noted below, Social  history, Allergies, and medications have been entered into the medical record, reviewed, and corrections made.   Review of Systems:  All review of systems negative except what is listed in the HPI   Objective:    General:  Speaking clearly in complete sentences. Absent shortness of breath noted.   Alert and oriented x3.   Normal judgment.  Absent acute distress.   Impression and Recommendations:    1. Generalized abdominal pain No new interventions today. ED workup unremarkable; likely enteritis per CT. Discussed bowel rest, hydration, nausea management. States she is already established with a GI and will reach out to them if not feeling significantly better after the weekend. Patient aware of signs/symptoms requiring further/urgent evaluation.    Follow-up if symptoms worsen or fail to improve.    I discussed the assessment and treatment plan with the patient. The patient was provided an opportunity to ask questions and all were answered. The patient agreed with the plan and demonstrated an understanding of the instructions.   The patient was advised to call back or seek an in-person evaluation if the symptoms worsen or if the condition fails to improve as anticipated.  I spent 20 minutes dedicated to the care of this patient on the date of this encounter to include pre-visit chart review of prior notes and results, face-to-face time with the patient, and post-visit ordering of testing as indicated.   Terrilyn Saver, NP

## 2021-02-27 ENCOUNTER — Other Ambulatory Visit: Payer: Self-pay | Admitting: Physician Assistant

## 2021-02-27 ENCOUNTER — Encounter: Payer: Self-pay | Admitting: Physician Assistant

## 2021-02-27 MED ORDER — HYDROMORPHONE HCL 2 MG PO TABS: 2.0000 mg | ORAL_TABLET | Freq: Four times a day (QID) | ORAL | 0 refills | Status: AC | PRN

## 2021-02-27 NOTE — Progress Notes (Signed)
..  PDMP reviewed during this encounter. Sent 10 dilaudid Generalized abdominal pain sent to ER 11/10 for enteritis.  Norco not helping.

## 2021-03-19 ENCOUNTER — Telehealth: Payer: Self-pay | Admitting: Neurology

## 2021-03-19 NOTE — Telephone Encounter (Signed)
Received request from [facility] for refill of Hydrocodone. This isn't on her med list. Looks like last prescribed 01/27/2021 #30 no refills, but temporary supply of Dilaudid was sent on 02/27/2021 #10 to replace Hydrocodone. She is not on a pain contract. Please advise.

## 2021-03-26 ENCOUNTER — Other Ambulatory Visit: Payer: Self-pay | Admitting: Physician Assistant

## 2021-03-26 DIAGNOSIS — J302 Other seasonal allergic rhinitis: Secondary | ICD-10-CM

## 2021-03-26 DIAGNOSIS — R0982 Postnasal drip: Secondary | ICD-10-CM

## 2021-03-27 NOTE — Telephone Encounter (Signed)
I looked she is on pain contract but someone scaned it in weird. I wait for her to call for refills.

## 2021-03-27 NOTE — Telephone Encounter (Signed)
She called the pharmacy, did you want to send in hydrocodone?

## 2021-03-28 ENCOUNTER — Other Ambulatory Visit: Payer: Self-pay | Admitting: *Deleted

## 2021-03-28 DIAGNOSIS — R1084 Generalized abdominal pain: Secondary | ICD-10-CM

## 2021-03-28 DIAGNOSIS — Z0289 Encounter for other administrative examinations: Secondary | ICD-10-CM

## 2021-03-28 DIAGNOSIS — M549 Dorsalgia, unspecified: Secondary | ICD-10-CM

## 2021-03-28 DIAGNOSIS — G894 Chronic pain syndrome: Secondary | ICD-10-CM

## 2021-03-28 DIAGNOSIS — G8929 Other chronic pain: Secondary | ICD-10-CM

## 2021-04-01 MED ORDER — HYDROCODONE-ACETAMINOPHEN 5-325 MG PO TABS: 1.0000 | ORAL_TABLET | Freq: Four times a day (QID) | ORAL | 0 refills | Status: DC | PRN

## 2021-04-01 NOTE — Telephone Encounter (Signed)
..  PDMP reviewed during this encounter. Pt ok for norco. Will send. Last pain rx was sent 11/16.

## 2021-04-10 ENCOUNTER — Ambulatory Visit: Payer: No Typology Code available for payment source | Admitting: Registered"

## 2021-04-22 ENCOUNTER — Other Ambulatory Visit: Payer: Self-pay

## 2021-04-22 ENCOUNTER — Encounter: Payer: Self-pay | Admitting: Registered"

## 2021-04-22 ENCOUNTER — Encounter: Payer: PRIVATE HEALTH INSURANCE | Attending: Gastroenterology | Admitting: Registered"

## 2021-04-22 ENCOUNTER — Other Ambulatory Visit: Payer: Self-pay | Admitting: Neurology

## 2021-04-22 DIAGNOSIS — K3184 Gastroparesis: Secondary | ICD-10-CM | POA: Diagnosis not present

## 2021-04-22 DIAGNOSIS — F411 Generalized anxiety disorder: Secondary | ICD-10-CM

## 2021-04-22 MED ORDER — HYDROXYZINE HCL 50 MG PO TABS: ORAL_TABLET | ORAL | 0 refills | Status: DC

## 2021-04-22 NOTE — Progress Notes (Signed)
Medical Nutrition Therapy  Appointment Start time:  1620  Appointment End time:  1718  Primary concerns today: diarrhea, abdominal pain - Referral diagnosis: K31.84 (ICD-10-CM) - Gastroparesis, R19.7 (ICD-10-CM) - Diarrhea, unspecified, R63.4 (ICD-10-CM) - Abnormal weight loss Preferred learning style: no preference indicated Learning readiness: contemplating, ready, change in progress   NUTRITION ASSESSMENT   Anthropometrics  Wt Readings from Last 3 Encounters:  02/21/21 99 lb (44.9 kg)  01/10/21 104 lb (47.2 kg)  11/22/20 103 lb 8 oz (46.9 kg)      Clinical Medical Hx: GERD, vomiting, seizures, kidney stones, ADHD, cholecystectomy, constipation, unintended weight loss Medications: reviewed Labs: reviewed Notable Signs/Symptoms: unintended weight loss, abdominal pain, diarrhea  Lifestyle & Dietary Hx Pt states she does not understand MD concern about her weight because her BMI is in normal range. Also does not understand gastroparesis diagnosis.  Pt states has been having GI symptoms for 3 yrs and several tests were not able to identify the cause of GI issues. Pt states probiotics have not been helpful. Pt states she has not found a consistent pattern with food that cause issues.  Pt states food that she can tolerate: Fruits: banana if not too ripe, peeled apple with PB, canned peaches Vegetables: roasted carrots, broccoli, corn, potatoes  Estimated daily fluid intake: water 3x17 oz bottles Supplements: none Sleep: 6-8 hours  Stress / self-care: not assessed Current average weekly physical activity: on the go all day (ADHD)  24-Hr Dietary Recall First Meal: toaster strudel (a little bit of pain today after eating) Snack: none Second Meal: 3 chicken nuggets, a few fries ([employer] beef and cheddar 1/2 sandwich, 5 curly fries, ~17 oz water - no symptoms) Snack: none Third Meal: none Snack: none Beverages: water, 8 oz Mtn Dew 3x/week, alcohol 1x/week 1 glass wine  NUTRITION  DIAGNOSIS  NB-1.1 Food and nutrition-related knowledge deficit As related to dose dependent effect of foods.  As evidenced by foods that do not cause GI issues predictably.   NUTRITION INTERVENTION  Nutrition education (E-1) on the following topics:  Complexity of determining which foods are causing problems due to the dose dependent nature of some compounds (a little may not cause issues, but continuing to consume could result in pain and/or diarrhea) Need for adequate intake not only for caloric value and weight maintenance, but also for basic nutrition needs of vitamins, minerals, protein. Nutritional supplements Fiber  Handouts Provided Include  Ensure Complete Lot [id number]  Learning Style & Readiness for Change Teaching method utilized: Visual & Auditory  Demonstrated degree of understanding via: Teach Back  Barriers to learning/adherence to lifestyle change: none  Goals Established by Pt Avoid high fiber foods (more than 2 grams per serving) Avoid high fat foods, especially solid fats Can add sugar or sweetener to foods to make them taste better and to add calories Nutritional supplements can help add calories and nutrition. If it has added fiber, may need to drink only 1/2 serving. Avoid drinking with meals   MONITORING & EVALUATION Dietary intake, weekly physical activity, and GI symptoms prn.  Next Steps  Patient is to call to schedule follow-up if desired.

## 2021-04-22 NOTE — Patient Instructions (Signed)
Avoid high fiber foods (more than 2 grams per serving) Avoid high fat foods, especially solid fats Can add sugar or sweetener to foods to make them taste better and to add calories Nutritional supplements can help add calories and nutrition. If it has added fiber, may need to drink only 1/2 serving. Avoid drinking with meals

## 2021-04-23 ENCOUNTER — Ambulatory Visit: Payer: Self-pay | Admitting: Internal Medicine

## 2021-04-29 ENCOUNTER — Ambulatory Visit (INDEPENDENT_AMBULATORY_CARE_PROVIDER_SITE_OTHER): Payer: PRIVATE HEALTH INSURANCE | Admitting: Physician Assistant

## 2021-04-29 ENCOUNTER — Other Ambulatory Visit: Payer: Self-pay

## 2021-04-29 ENCOUNTER — Encounter: Payer: Self-pay | Admitting: Physician Assistant

## 2021-04-29 VITALS — BP 128/82 | HR 88 | Ht 60.0 in | Wt 101.0 lb

## 2021-04-29 DIAGNOSIS — F19931 Other psychoactive substance use, unspecified with withdrawal delirium: Secondary | ICD-10-CM | POA: Diagnosis not present

## 2021-04-29 DIAGNOSIS — K30 Functional dyspepsia: Secondary | ICD-10-CM | POA: Diagnosis not present

## 2021-04-29 DIAGNOSIS — R253 Fasciculation: Secondary | ICD-10-CM | POA: Diagnosis not present

## 2021-04-29 DIAGNOSIS — F419 Anxiety disorder, unspecified: Secondary | ICD-10-CM

## 2021-04-29 DIAGNOSIS — R1084 Generalized abdominal pain: Secondary | ICD-10-CM | POA: Diagnosis not present

## 2021-04-29 DIAGNOSIS — R569 Unspecified convulsions: Secondary | ICD-10-CM

## 2021-04-29 MED ORDER — METOCLOPRAMIDE HCL 10 MG PO TABS: ORAL_TABLET | ORAL | 0 refills | Status: DC

## 2021-04-29 MED ORDER — HYDROXYZINE PAMOATE 25 MG PO CAPS: ORAL_CAPSULE | ORAL | 2 refills | Status: DC

## 2021-04-29 NOTE — Patient Instructions (Addendum)
Calm-Aid daily.  Hydroxizine for panic attacks Reglan before meals

## 2021-04-29 NOTE — Progress Notes (Signed)
Subjective:    Patient ID: Annette Cook, female    DOB: [birth year], 36 y.o.   MRN: [id number]  HPI Pt is a 36 yo female with generalized abdominal pain and intermittent flares, hx of drug withdrawal induced seizure, anxiety, constipation who presents to the clinic for medication follow up and to fill out FMLA paperwork.   She occasional has to miss work during flares of abdominal pain and take norco or dilaudid at times. She see gastroenterology and nutrition. She never started reglan. She is on  linzess for constipation.    .. 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 03/15/2015   Patellofemoral pain syndrome 04/12/2015   Takes daily multivitamins 09/12/2016   Opioid use agreement exists 10/24/2016   Encounter for chronic pain management 10/24/2016   Adult ADHD 04/10/2017   Overactive bladder 01/21/2018   PTSD (post-traumatic stress disorder) 11/12/2018   Anxiety 11/12/2018   Myofascial pain syndrome 11/12/2018   Drug withdrawal seizure with delirium ([place]) 02/28/2019   Elevated blood pressure reading 02/28/2019   Gastroesophageal reflux disease 02/28/2019   Moderate episode of recurrent major depressive disorder ([place]) 02/28/2019   History of cholecystectomy 03/23/2019   Black stools 07/11/2019   Leukocytes in urine 07/11/2019   Kidney stone on right side 07/11/2019   Left ovarian cyst 07/11/2019   Constipation 07/11/2019   Generalized abdominal pain 02/18/2020   Pill dysphagia 02/18/2020   Non-intractable vomiting 02/18/2020   Rectal bleeding 02/18/2020   Unintentional weight loss 02/18/2020   History of ERCP 02/18/2020   Chronic right-sided low back pain without sciatica 06/12/2020   Unintended  weight loss 03/00/9233   History of Helicobacter pylori infection 06/12/2020   Chronic pain syndrome 10/09/2020   Pain management contract agreement 10/09/2020   Chronic upper abdominal pain 10/10/2020   Mid back pain on right side 10/10/2020   Irritable bowel syndrome with both constipation and diarrhea 10/10/2020   Bloating 10/10/2020   Hives 01/14/2021   Delayed gastric emptying 01/14/2021   Muscle twitching 05/03/2021   Resolved Ambulatory Problems    Diagnosis Date Noted   Supervision of normal IUP (intrauterine pregnancy) in multigravida 11/09/2011   Rh negative status during pregnancy 02/03/2012   Marginal placenta previa 02/03/2012   Pain in throat 04/18/2013   Encounter for supervision of other normal pregnancy in first trimester 12/07/2013   Anxiety during pregnancy in second trimester, antepartum 01/13/2014   Active labor 06/01/2014   Vaginal delivery 06/01/2014   Shoulder dystocia, delivered, current hospitalization 06/01/2014   Bilateral hand numbness 01/16/2015   Cough 02/21/2015   Cervical radiculopathy at C8 02/27/2015   Otitis, externa, infective 03/15/2015   Anxiety and depression 09/03/2015   Ulnar nerve entrapment 09/12/2016   Polypharmacy 10/24/2016   Influenza-like illness 05/13/2017   Neck pain 11/12/2018   Chronic neck pain 02/28/2019   Upper back pain 06/07/2019   DDD (degenerative disc disease), cervical 06/07/2019   Past Medical History:  Diagnosis Date   Asthma    Depression    GERD (gastroesophageal reflux disease)    Headache    Seizure ([place]) 01/06/2019      Review of Systems     Objective:   Physical Exam Vitals reviewed.  Constitutional:  Appearance: Normal appearance.  HENT:     Head: Normocephalic.  Cardiovascular:     Rate and Rhythm: Normal rate and regular rhythm.  Pulmonary:     Effort: Pulmonary effort is normal.     Breath sounds: Normal breath sounds.  Abdominal:     General: Bowel sounds are normal. There is  no distension.     Palpations: Abdomen is soft. There is no mass.     Tenderness: There is no abdominal tenderness. There is no right CVA tenderness, left CVA tenderness, guarding or rebound.     Hernia: No hernia is present.  Neurological:     General: No focal deficit present.     Mental Status: She is alert.  Psychiatric:        Mood and Affect: Mood normal.          Assessment & Plan:  Marland KitchenMarland KitchenJennise was seen today for follow-up.  Diagnoses and all orders for this visit:  Generalized abdominal pain  Drug withdrawal seizure with delirium ([place]) -     Ambulatory referral to Neurology  Delayed gastric emptying -     metoCLOPramide (REGLAN) 10 MG tablet; 1 tab PO TID before meals.  Muscle twitching -     hydrOXYzine (VISTARIL) 25 MG capsule; Take one to two tablets for anxiety/panic  Anxiety -     hydrOXYzine (VISTARIL) 25 MG capsule; Take one to two tablets for anxiety/panic   .Marland KitchenPDMP reviewed during this encounter. On pain contract Does not need refills No concerns 3 month follow up by [place]  New referral placed for neurology  Gastroparesis-seeing gastroenterology, nutrition Start reglan Norco as needed FMLA filled out for as needed absences up to twice a month for 2 days.

## 2021-05-02 ENCOUNTER — Other Ambulatory Visit: Payer: Self-pay | Admitting: Neurology

## 2021-05-02 DIAGNOSIS — F411 Generalized anxiety disorder: Secondary | ICD-10-CM

## 2021-05-02 NOTE — Progress Notes (Signed)
Patient's Psychiatrist is leaving (Dr. Kathie Dike) and wants new referral to Annette Cook. Referral placed.

## 2021-05-03 DIAGNOSIS — R253 Fasciculation: Secondary | ICD-10-CM | POA: Insufficient documentation

## 2021-05-05 DIAGNOSIS — R932 Abnormal findings on diagnostic imaging of liver and biliary tract: Secondary | ICD-10-CM | POA: Insufficient documentation

## 2021-05-05 DIAGNOSIS — K3184 Gastroparesis: Secondary | ICD-10-CM | POA: Insufficient documentation

## 2021-05-07 ENCOUNTER — Telehealth: Payer: Self-pay | Admitting: Physician Assistant

## 2021-05-07 MED ORDER — PROMETHAZINE HCL 25 MG RE SUPP: 25.0000 mg | Freq: Four times a day (QID) | RECTAL | 1 refills | Status: DC | PRN

## 2021-05-07 MED ORDER — ONDANSETRON 4 MG PO TBDP: ORAL_TABLET | ORAL | 1 refills | Status: DC

## 2021-05-07 NOTE — Telephone Encounter (Signed)
Zofran ad phenergan needing refills.

## 2021-05-15 ENCOUNTER — Telehealth: Payer: Self-pay | Admitting: Physician Assistant

## 2021-05-15 DIAGNOSIS — R253 Fasciculation: Secondary | ICD-10-CM

## 2021-05-15 DIAGNOSIS — M545 Low back pain, unspecified: Secondary | ICD-10-CM

## 2021-05-15 DIAGNOSIS — G8929 Other chronic pain: Secondary | ICD-10-CM

## 2021-05-15 DIAGNOSIS — R1084 Generalized abdominal pain: Secondary | ICD-10-CM

## 2021-05-15 DIAGNOSIS — Z0289 Encounter for other administrative examinations: Secondary | ICD-10-CM

## 2021-05-15 DIAGNOSIS — M549 Dorsalgia, unspecified: Secondary | ICD-10-CM

## 2021-05-15 DIAGNOSIS — G894 Chronic pain syndrome: Secondary | ICD-10-CM

## 2021-05-15 MED ORDER — HYDROCODONE-ACETAMINOPHEN 5-325 MG PO TABS: 1.0000 | ORAL_TABLET | Freq: Four times a day (QID) | ORAL | 0 refills | Status: DC | PRN

## 2021-05-15 MED ORDER — TIZANIDINE HCL 4 MG PO CAPS: ORAL_CAPSULE | ORAL | 1 refills | Status: DC

## 2021-05-15 NOTE — Telephone Encounter (Signed)
..  PDMP reviewed during this encounter. Recent appt No concerns UTD pain contract Refilled.

## 2021-05-20 ENCOUNTER — Ambulatory Visit: Payer: Medicaid Other | Admitting: Internal Medicine

## 2021-06-13 ENCOUNTER — Other Ambulatory Visit: Payer: Self-pay | Admitting: Neurology

## 2021-06-13 DIAGNOSIS — M549 Dorsalgia, unspecified: Secondary | ICD-10-CM

## 2021-06-13 DIAGNOSIS — G8929 Other chronic pain: Secondary | ICD-10-CM

## 2021-06-13 DIAGNOSIS — R1084 Generalized abdominal pain: Secondary | ICD-10-CM

## 2021-06-13 DIAGNOSIS — Z0289 Encounter for other administrative examinations: Secondary | ICD-10-CM

## 2021-06-13 DIAGNOSIS — G894 Chronic pain syndrome: Secondary | ICD-10-CM

## 2021-06-13 DIAGNOSIS — M545 Low back pain, unspecified: Secondary | ICD-10-CM

## 2021-06-13 NOTE — Telephone Encounter (Signed)
Received request from pharmacy for refill of hydrocodone.  ?Last written 05/15/2021 #30 no refills ?Last appt 04/29/2021 ?

## 2021-06-14 MED ORDER — HYDROCODONE-ACETAMINOPHEN 5-325 MG PO TABS: 1.0000 | ORAL_TABLET | Freq: Four times a day (QID) | ORAL | 0 refills | Status: AC | PRN

## 2021-06-17 ENCOUNTER — Ambulatory Visit: Payer: Medicaid Other | Admitting: Internal Medicine

## 2021-06-18 ENCOUNTER — Ambulatory Visit: Payer: Medicaid Other | Admitting: Internal Medicine

## 2021-07-04 ENCOUNTER — Other Ambulatory Visit: Payer: Self-pay | Admitting: Physician Assistant

## 2021-07-04 ENCOUNTER — Other Ambulatory Visit: Payer: Self-pay | Admitting: Neurology

## 2021-07-04 DIAGNOSIS — F900 Attention-deficit hyperactivity disorder, predominantly inattentive type: Secondary | ICD-10-CM

## 2021-07-04 DIAGNOSIS — B009 Herpesviral infection, unspecified: Secondary | ICD-10-CM

## 2021-07-04 DIAGNOSIS — K219 Gastro-esophageal reflux disease without esophagitis: Secondary | ICD-10-CM

## 2021-07-04 DIAGNOSIS — F419 Anxiety disorder, unspecified: Secondary | ICD-10-CM

## 2021-07-04 DIAGNOSIS — J302 Other seasonal allergic rhinitis: Secondary | ICD-10-CM

## 2021-07-04 DIAGNOSIS — F411 Generalized anxiety disorder: Secondary | ICD-10-CM

## 2021-07-04 DIAGNOSIS — R569 Unspecified convulsions: Secondary | ICD-10-CM

## 2021-07-04 DIAGNOSIS — K30 Functional dyspepsia: Secondary | ICD-10-CM

## 2021-07-04 DIAGNOSIS — R0982 Postnasal drip: Secondary | ICD-10-CM

## 2021-07-04 DIAGNOSIS — R253 Fasciculation: Secondary | ICD-10-CM

## 2021-07-04 MED ORDER — HYDROXYZINE HCL 50 MG PO TABS: ORAL_TABLET | ORAL | 0 refills | Status: DC

## 2021-07-04 MED ORDER — OXCARBAZEPINE 300 MG PO TABS: 300.0000 mg | ORAL_TABLET | Freq: Two times a day (BID) | ORAL | 3 refills | Status: DC

## 2021-07-04 MED ORDER — ATOMOXETINE HCL 80 MG PO CAPS: ORAL_CAPSULE | ORAL | 1 refills | Status: DC

## 2021-07-04 MED ORDER — VALACYCLOVIR HCL 500 MG PO TABS: 500.0000 mg | ORAL_TABLET | Freq: Every day | ORAL | 1 refills | Status: DC

## 2021-07-04 MED ORDER — TIZANIDINE HCL 4 MG PO CAPS: ORAL_CAPSULE | ORAL | 1 refills | Status: DC

## 2021-07-04 MED ORDER — FLUTICASONE PROPIONATE 50 MCG/ACT NA SUSP: 2.0000 | Freq: Every day | NASAL | 5 refills | Status: DC

## 2021-07-04 MED ORDER — METOCLOPRAMIDE HCL 10 MG PO TABS: ORAL_TABLET | ORAL | 1 refills | Status: DC

## 2021-07-04 MED ORDER — LEVOCETIRIZINE DIHYDROCHLORIDE 5 MG PO TABS: ORAL_TABLET | ORAL | 1 refills | Status: DC

## 2021-07-04 MED ORDER — ONDANSETRON 4 MG PO TBDP: ORAL_TABLET | ORAL | 1 refills | Status: DC

## 2021-07-04 MED ORDER — PROMETHAZINE HCL 25 MG RE SUPP: 25.0000 mg | Freq: Four times a day (QID) | RECTAL | 1 refills | Status: DC | PRN

## 2021-07-04 MED ORDER — LINACLOTIDE 145 MCG PO CAPS: ORAL_CAPSULE | ORAL | 1 refills | Status: DC

## 2021-07-04 MED ORDER — PANTOPRAZOLE SODIUM 40 MG PO TBEC: 40.0000 mg | DELAYED_RELEASE_TABLET | Freq: Two times a day (BID) | ORAL | 3 refills | Status: DC

## 2021-07-04 MED ORDER — HYDROXYZINE PAMOATE 25 MG PO CAPS: 25.0000 mg | ORAL_CAPSULE | Freq: Three times a day (TID) | ORAL | 2 refills | Status: DC | PRN

## 2021-07-15 ENCOUNTER — Ambulatory Visit: Payer: Medicaid Other | Admitting: Internal Medicine

## 2021-07-16 ENCOUNTER — Telehealth: Payer: Self-pay | Admitting: Physician Assistant

## 2021-07-16 DIAGNOSIS — Z0289 Encounter for other administrative examinations: Secondary | ICD-10-CM

## 2021-07-16 DIAGNOSIS — R1084 Generalized abdominal pain: Secondary | ICD-10-CM

## 2021-07-16 DIAGNOSIS — G894 Chronic pain syndrome: Secondary | ICD-10-CM

## 2021-07-16 DIAGNOSIS — M549 Dorsalgia, unspecified: Secondary | ICD-10-CM

## 2021-07-16 DIAGNOSIS — G8929 Other chronic pain: Secondary | ICD-10-CM

## 2021-07-16 MED ORDER — HYDROCODONE-ACETAMINOPHEN 5-325 MG PO TABS: 1.0000 | ORAL_TABLET | Freq: Four times a day (QID) | ORAL | 0 refills | Status: DC | PRN

## 2021-07-16 NOTE — Telephone Encounter (Signed)
..  PDMP reviewed during this encounter. ?No concerns ?Refills sent ?
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
## 2021-08-05 ENCOUNTER — Telehealth: Payer: PRIVATE HEALTH INSURANCE | Admitting: Physician Assistant

## 2021-08-09 ENCOUNTER — Telehealth (INDEPENDENT_AMBULATORY_CARE_PROVIDER_SITE_OTHER): Payer: PRIVATE HEALTH INSURANCE | Admitting: Physician Assistant

## 2021-08-09 ENCOUNTER — Encounter: Payer: Self-pay | Admitting: Physician Assistant

## 2021-08-09 DIAGNOSIS — J302 Other seasonal allergic rhinitis: Secondary | ICD-10-CM

## 2021-08-09 DIAGNOSIS — M549 Dorsalgia, unspecified: Secondary | ICD-10-CM

## 2021-08-09 DIAGNOSIS — Z0289 Encounter for other administrative examinations: Secondary | ICD-10-CM

## 2021-08-09 DIAGNOSIS — K219 Gastro-esophageal reflux disease without esophagitis: Secondary | ICD-10-CM | POA: Diagnosis not present

## 2021-08-09 DIAGNOSIS — M545 Low back pain, unspecified: Secondary | ICD-10-CM

## 2021-08-09 DIAGNOSIS — G894 Chronic pain syndrome: Secondary | ICD-10-CM | POA: Diagnosis not present

## 2021-08-09 DIAGNOSIS — R1084 Generalized abdominal pain: Secondary | ICD-10-CM | POA: Diagnosis not present

## 2021-08-09 DIAGNOSIS — K5909 Other constipation: Secondary | ICD-10-CM

## 2021-08-09 DIAGNOSIS — G8929 Other chronic pain: Secondary | ICD-10-CM

## 2021-08-09 MED ORDER — HYDROCODONE-ACETAMINOPHEN 5-325 MG PO TABS: 1.0000 | ORAL_TABLET | Freq: Four times a day (QID) | ORAL | 0 refills | Status: AC | PRN

## 2021-08-09 MED ORDER — CLONAZEPAM 0.5 MG PO TABS: 0.5000 mg | ORAL_TABLET | Freq: Two times a day (BID) | ORAL | 0 refills | Status: DC | PRN

## 2021-08-09 MED ORDER — PANTOPRAZOLE SODIUM 40 MG PO TBEC: 40.0000 mg | DELAYED_RELEASE_TABLET | Freq: Two times a day (BID) | ORAL | 3 refills | Status: DC

## 2021-08-09 MED ORDER — LINACLOTIDE 145 MCG PO CAPS: ORAL_CAPSULE | ORAL | 3 refills | Status: DC

## 2021-08-09 MED ORDER — HYDROCODONE-ACETAMINOPHEN 5-325 MG PO TABS: 1.0000 | ORAL_TABLET | Freq: Four times a day (QID) | ORAL | 0 refills | Status: DC | PRN

## 2021-08-09 NOTE — Progress Notes (Signed)
..Virtual Visit via Telephone Note ? ?I connected with Annette Cook on 08/12/21 at  9:50 AM EDT by telephone and verified that I am speaking with the correct person using two identifiers. ? ?Location: ?Patient: work  ?Provider: clinic ? ?[name].Participating in visit:  ?Patient: Annette Cook ?Provider: Iran Planas PA-c ?  ?I discussed the limitations, risks, security and privacy concerns of performing an evaluation and management service by telephone and the availability of in person appointments. I also discussed with the patient that there may be a patient responsible charge related to this service. The patient expressed understanding and agreed to proceed. ? ? ?History of Present Illness: ?Pt presents to the clinic for chronic pain managements and GERD medication follow up.  ? ?Pt is doing ok with symptoms. She continues to have GI flares with abdominal pain. Reglan seems to heLP but only taken as needed. She then takes norco. GERD is controlled. Constipation better on linzess. Needs refills.  ? ?She needs allergy testing but will have to be off vistaril for a few days and worried about her anxiety/panic attacks if she cannot take vistaril.  ? ? .. ?Active Ambulatory Problems  ?  Diagnosis Date Noted  ? HSV infection 12/02/2011  ? Generalized anxiety disorder 10/22/2012  ? Insomnia 10/22/2012  ? Panic attack 10/22/2012  ? ADHD (attention deficit hyperactivity disorder) 10/22/2012  ? Acute stress reaction 07/08/2013  ? 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 03/15/2015  ? Patellofemoral pain syndrome 04/12/2015  ? Takes daily multivitamins 09/12/2016  ? Opioid use agreement exists 10/24/2016  ? Encounter for chronic pain management 10/24/2016  ? Adult ADHD 04/10/2017  ? Overactive bladder 01/21/2018  ? PTSD (post-traumatic stress disorder) 11/12/2018  ? Anxiety 11/12/2018  ? Myofascial pain syndrome 11/12/2018  ? Drug withdrawal seizure with delirium ([place]) 02/28/2019  ? Elevated blood pressure reading 02/28/2019   ? Gastroesophageal reflux disease 02/28/2019  ? Moderate episode of recurrent major depressive disorder ([place]) 02/28/2019  ? History of cholecystectomy 03/23/2019  ? Black stools 07/11/2019  ? Leukocytes in urine 07/11/2019  ? Kidney stone on right side 07/11/2019  ? Left ovarian cyst 07/11/2019  ? Constipation 07/11/2019  ? Generalized abdominal pain 02/18/2020  ? Pill dysphagia 02/18/2020  ? Non-intractable vomiting 02/18/2020  ? Rectal bleeding 02/18/2020  ? Unintentional weight loss 02/18/2020  ? History of ERCP 02/18/2020  ? Chronic right-sided low back pain without sciatica 06/12/2020  ? Unintended weight loss 06/12/2020  ? History of Helicobacter pylori infection 06/12/2020  ? Chronic pain syndrome 10/09/2020  ? Pain management contract agreement 10/09/2020  ? Chronic upper abdominal pain 10/10/2020  ? Mid back pain on right side 10/10/2020  ? Irritable bowel syndrome with both constipation and diarrhea 10/10/2020  ? Bloating 10/10/2020  ? Hives 01/14/2021  ? Delayed gastric emptying 01/14/2021  ? Muscle twitching 05/03/2021  ? Abnormal findings on imaging of biliary tract 05/05/2021  ? Benzodiazepine withdrawal ([place]) 01/08/2019  ? Elevated blood pressure reading in office without diagnosis of hypertension 02/28/2019  ? Gastroparesis 05/05/2021  ? Panic disorder without agoraphobia 10/22/2012  ? Recurrent major depressive disorder ([place]) 02/28/2019  ? Right-sided tinnitus 08/17/2019  ? ?Resolved Ambulatory Problems  ?  Diagnosis Date Noted  ? Supervision of normal IUP (intrauterine pregnancy) in multigravida 11/09/2011  ? Rh negative status during pregnancy 02/03/2012  ? Marginal placenta previa 02/03/2012  ? Pain in throat 04/18/2013  ? Encounter for supervision of other normal pregnancy in first trimester 12/07/2013  ? Anxiety during  pregnancy in second trimester, antepartum 01/13/2014  ? Active labor 06/01/2014  ? Vaginal delivery 06/01/2014  ? Shoulder dystocia, delivered, current hospitalization  06/01/2014  ? Bilateral hand numbness 01/16/2015  ? Cough 02/21/2015  ? Cervical radiculopathy at C8 02/27/2015  ? Otitis, externa, infective 03/15/2015  ? Anxiety and depression 09/03/2015  ? 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 Polypharmacy 10/24/2016  ? Influenza-like illness 05/13/2017  ? Neck pain 11/12/2018  ? Chronic neck pain 02/28/2019  ? Upper back pain 06/07/2019  ? DDD (degenerative disc disease), cervical 06/07/2019  ? ?Past Medical History:  ?Diagnosis Date  ? Asthma   ? Depression   ? GERD (gastroesophageal reflux disease)   ? Headache   ? Seizure ([place]) 01/06/2019  ? ? ?Observations/Objective: ?No acute distress ?Normal mood and appearance ? ?Assessment and Plan: ?..Annette Cook was seen today for follow-up. ? ?Diagnoses and all orders for this visit: ? ?Chronic pain syndrome ?-     HYDROcodone-acetaminophen (NORCO/VICODIN) 5-325 MG tablet; Take 1 tablet by mouth every 6 (six) hours as needed for moderate pain. Chronic pain contract. ?-     HYDROcodone-acetaminophen (NORCO/VICODIN) 5-325 MG tablet; Take 1 tablet by mouth every 6 (six) hours as needed for moderate pain. ?-     HYDROcodone-acetaminophen (NORCO/VICODIN) 5-325 MG tablet; Take 1 tablet by mouth every 6 (six) hours as needed for moderate pain. ? ?Gastroesophageal reflux disease, unspecified whether esophagitis present ?-     pantoprazole (PROTONIX) 40 MG tablet; Take 1 tablet (40 mg total) by mouth 2 (two) times daily. ? ?Pain management contract agreement ?-     HYDROcodone-acetaminophen (NORCO/VICODIN) 5-325 MG tablet; Take 1 tablet by mouth every 6 (six) hours as needed for moderate pain. Chronic pain contract. ?-     HYDROcodone-acetaminophen (NORCO/VICODIN) 5-325 MG tablet; Take 1 tablet by mouth every 6 (six) hours as needed for moderate pain. ? ?Generalized abdominal pain ?-     HYDROcodone-acetaminophen (NORCO/VICODIN) 5-325 MG tablet; Take 1 tablet by mouth every 6 (six) hours as needed for moderate pain. Chronic pain contract. ?-      HYDROcodone-acetaminophen (NORCO/VICODIN) 5-325 MG tablet; Take 1 tablet by mouth every 6 (six) hours as needed for moderate pain. ?-     HYDROcodone-acetaminophen (NORCO/VICODIN) 5-325 MG tablet; Take 1 tablet by mouth every 6 (six) hours as needed for moderate pain. ? ?Chronic right-sided low back pain without sciatica ?-     HYDROcodone-acetaminophen (NORCO/VICODIN) 5-325 MG tablet; Take 1 tablet by mouth every 6 (six) hours as needed for moderate pain. Chronic pain contract. ?-     HYDROcodone-acetaminophen (NORCO/VICODIN) 5-325 MG tablet; Take 1 tablet by mouth every 6 (six) hours as needed for moderate pain. ?-     HYDROcodone-acetaminophen (NORCO/VICODIN) 5-325 MG tablet; Take 1 tablet by mouth every 6 (six) hours as needed for moderate pain. ? ?Mid back pain on right side ?-     HYDROcodone-acetaminophen (NORCO/VICODIN) 5-325 MG tablet; Take 1 tablet by mouth every 6 (six) hours as needed for moderate pain. Chronic pain contract. ?-     HYDROcodone-acetaminophen (NORCO/VICODIN) 5-325 MG tablet; Take 1 tablet by mouth every 6 (six) hours as needed for moderate pain. ?-     HYDROcodone-acetaminophen (NORCO/VICODIN) 5-325 MG tablet; Take 1 tablet by mouth every 6 (six) hours as needed for moderate pain. ? ?Chronic constipation ?-     linaclotide (LINZESS) 145 MCG CAPS capsule; TAKE 1 CAPSULE BY MOUTH DAILY BEFORE BREAKFAST. ? ?Seasonal allergies ? ?Other orders ?-     clonazePAM (KLONOPIN)  0.5 MG tablet; Take 1 tablet (0.5 mg total) by mouth 2 (two) times daily as needed for anxiety. ? ? ?..PDMP reviewed during this encounter. ?[place] REFILLED.  ?ON PAIN CONTRACT ?[facility] UP TO DATE. ?FOLLOW UP IN 3 MONTHS.  ? ?KLONAPIN AS NEEDED DURING TIME WHERE SHE CANNOT BE VISTARIL FOR ALLERGY TESTING. SMALL QUANTITY.  DISCUSSED WITH PATIENT.  ? ?Follow Up Instructions: ? ?  ?I discussed the assessment and treatment plan with the patient. The patient was provided an opportunity to ask questions and all were answered. The  patient agreed with the plan and demonstrated an understanding of the instructions. ?  ?The patient was advised to call back or seek an in-person evaluation if the symptoms worsen or if the condition fails to improve

## 2021-08-12 ENCOUNTER — Encounter: Payer: Self-pay | Admitting: Physician Assistant

## 2021-08-20 ENCOUNTER — Ambulatory Visit: Payer: Medicaid Other | Admitting: Internal Medicine

## 2021-09-17 ENCOUNTER — Ambulatory Visit (INDEPENDENT_AMBULATORY_CARE_PROVIDER_SITE_OTHER): Payer: PRIVATE HEALTH INSURANCE | Admitting: Internal Medicine

## 2021-09-17 ENCOUNTER — Other Ambulatory Visit: Payer: Self-pay | Admitting: Physician Assistant

## 2021-09-17 ENCOUNTER — Encounter: Payer: Self-pay | Admitting: Internal Medicine

## 2021-09-17 VITALS — BP 118/76 | HR 58 | Temp 97.7°F | Ht 61.0 in | Wt 103.1 lb

## 2021-09-17 DIAGNOSIS — J4520 Mild intermittent asthma, uncomplicated: Secondary | ICD-10-CM | POA: Diagnosis not present

## 2021-09-17 DIAGNOSIS — L501 Idiopathic urticaria: Secondary | ICD-10-CM

## 2021-09-17 DIAGNOSIS — J3089 Other allergic rhinitis: Secondary | ICD-10-CM | POA: Diagnosis not present

## 2021-09-17 DIAGNOSIS — J302 Other seasonal allergic rhinitis: Secondary | ICD-10-CM

## 2021-09-17 DIAGNOSIS — R0982 Postnasal drip: Secondary | ICD-10-CM

## 2021-09-17 MED ORDER — LEVOCETIRIZINE DIHYDROCHLORIDE 5 MG PO TABS: ORAL_TABLET | ORAL | 1 refills | Status: DC

## 2021-09-17 MED ORDER — AZELASTINE HCL 0.1 % NA SOLN: NASAL | 1 refills | Status: DC

## 2021-09-17 MED ORDER — ALBUTEROL SULFATE HFA 108 (90 BASE) MCG/ACT IN AERS: 2.0000 | INHALATION_SPRAY | RESPIRATORY_TRACT | 3 refills | Status: DC | PRN

## 2021-09-17 NOTE — Patient Instructions (Signed)
Chronic Idiopathic Urticaria: - this is defined as hives lasting more than 6 weeks without an identifiable trigger - hives can be from a number of different sources including infections, allergies, vibration, temperature, pressure among many others other possible causes - often an identifiable cause is not determined - some potential triggers include: stress, illness, NSAIDs, aspirin, hormonal changes - you do not have any red flag symptoms to make [place] concerned about secondary causes of hives, but we will screen for these for reassurance with: CBC w diff, CMP, tryptase, TSH, hive panel, alpha-gal panel, inflammatory markers - approximately 50% of patients with chronic hives can have some associated swelling of the face/lips/eyelids (this is not a cause for alarm and does not typically progress onto systemic allergic reactions)  Therapy Plan:  - start xyzal '5mg'$  twice daily  - if hives are uncontrolled, increase xyzal to '10mg'$  twice daily (this is the maximum dose)  - can increase or decrease dosing depending on symptom control to a maximum dose of 4 tablets of antihistamine daily. Wait until hives free for at least one month prior to decreasing dose.   - if hives are still uncontrolled with the above regimen, please arrange an appointment for discussion of Xolair (omalizumab)- an injectable medication for hives  Can use one of the following in place of zyrtec if desires: Claritin (loratadine) 10 mg, Xyzal (levocetirizine) 5 mg or Allegra (fexofenadine) 180 mg daily as needed  Mild intermittent asthma: Well-controlled - Breathing test today showed: Looks great - Based on symptoms and breathing tests your asthma is well controlled and we need to continue current therapy  PLAN:  - Prior to physical activity: albuterol 2 puffs 10-15 minutes before physical activity. - Rescue medications: albuterol 4 puffs every 4-6 hours as needed - Get Influenza Vaccine and appropriate Pneumonia and COVID 19  boosters  - Asthma control goals:  * Full participation in all desired activities (may need albuterol before activity) * Albuterol use two time or less a week on average (not counting use with activity) * Cough interfering with sleep two time or less a month * Oral steroids no more than once a year * No hospitalizations  Seasonal allergic rhinitis: Moderately well controlled  - Testing today showed positive to grass, weeds, trees - Copy of test results provided.  - Avoidance measures provided. - Continue with:  Xyzal 5 mg twice daily and Flonase (fluticasone) two sprays per nostril daily - Start taking: Singulair (montelukast) '10mg'$  daily and Astelin (azelastine) 2 sprays per nostril 1-2 times daily as needed - You can use an extra dose of the antihistamine, if needed, for breakthrough symptoms.  - Consider nasal saline rinses 1-2 times daily to remove allergens from the nasal cavities as well as help with mucous clearance (this is especially helpful to do before the nasal sprays are given) - Consider allergy shots as a means of long-term control and can reduce lifetime use of medications  - Allergy shots "re-train" and "reset" the immune system to ignore environmental allergens and decrease the resulting immune response to those allergens (sneezing, itchy watery eyes, runny nose, nasal congestion, etc).    - Allergy shots improve symptoms in 75-85%  - Allergy shots are the only potential permanent and disease modifying option  - We can discuss more at the next appointment if the medications are not working for you.   Follow up: 4 weeks   Thank you so much for letting me partake in your care today.  Don't hesitate  to reach out if you have any additional concerns!  Roney Marion, MD  [facility], [place]

## 2021-09-17 NOTE — Progress Notes (Signed)
New Patient Note  RE: Annette Cook MRN: [id number] DOB: Aug 13, 1985 Date of Office Visit: 09/17/2021  Consult requested by: Donella Stade, PA-C Primary care provider: Donella Stade, PA-C  Chief Complaint: No chief complaint on file.  History of Present Illness: I had the pleasure of seeing Annette Cook for initial evaluation at the [facility] on 09/17/2021. She is a 36 y.o. female, who is referred here by Donella Stade, PA-C for the evaluation of urticaria .  History obtained from patient.   Reports 1.5 year history of red raised macules and papules that are not pruritic that will last 30-60 minutes and occurring twice a week.[name]  Has not tried any antihistamines or topical medicines for treatment, however has been on xyzal every day for  rhinitis symptoms.  May worse with sun exposure.    Denies any associated food, medication or contact exposure.  Denies any association with sun, cold, heat, exercise, pressure, water or vibration exposure.  Lesions are not painful or fixed.  Denies associated angioedema.  Denies any associated fevers or arthritis.  Chronic rhinitis: started as a young adult  Symptoms include: nasal congestion, rhinorrhea, post nasal drainage, sneezing, watery eyes, itchy eyes, and itchy nose  Occurs seasonally-spring through fall  Potential triggers: pollen season  Treatments tried: singulair, xyzal, flonase, hydroxyzine  Previous allergy testing:  yes as child  History of reflux/heartburn:  Yes on protonix twice a day  History of chronic sinusitis or sinus surgery: no Nonallergic triggers: spicy food   Assessment and Plan: Annette Cook is a 37 y.o. female with:Xyzal '5mg'$  twi Idiopathic urticaria - Plan: Spirometry with Graph, Allergy Test, CBC with Differential/Platelet, Comprehensive metabolic panel, Tryptase, TSH, Chronic Urticaria, Alpha-Gal Panel, Sedimentation rate  Mild intermittent asthma without complication - Plan: Spirometry with  Graph, Allergy Test  Other allergic rhinitis Plan: Patient Instructions  Chronic Idiopathic Urticaria: - this is defined as hives lasting more than 6 weeks without an identifiable trigger - hives can be from a number of different sources including infections, allergies, vibration, temperature, pressure among many others other possible causes - often an identifiable cause is not determined - some potential triggers include: stress, illness, NSAIDs, aspirin, hormonal changes - you do not have any red flag symptoms to make [place] concerned about secondary causes of hives, but we will screen for these for reassurance with: CBC w diff, CMP, tryptase, TSH, hive panel, alpha-gal panel, inflammatory markers - approximately 50% of patients with chronic hives can have some associated swelling of the face/lips/eyelids (this is not a cause for alarm and does not typically progress onto systemic allergic reactions)  Therapy Plan:  - start xyzal '5mg'$  twice daily  - if hives are uncontrolled, increase xyzal to '10mg'$  twice daily (this is the maximum dose)  - can increase or decrease dosing depending on symptom control to a maximum dose of 4 tablets of antihistamine daily. Wait until hives free for at least one month prior to decreasing dose.   - if hives are still uncontrolled with the above regimen, please arrange an appointment for discussion of Xolair (omalizumab)- an injectable medication for hives  Can use one of the following in place of zyrtec if desires: Claritin (loratadine) 10 mg, Xyzal (levocetirizine) 5 mg or Allegra (fexofenadine) 180 mg daily as needed  Mild intermittent asthma: Well-controlled - Breathing test today showed: Looks great - Based on symptoms and breathing tests your asthma is well controlled and we need to continue current therapy  PLAN:  - Prior to physical activity: albuterol 2 puffs 10-15 minutes before physical activity. - Rescue medications: albuterol 4 puffs every 4-6 hours as  needed - Get Influenza Vaccine and appropriate Pneumonia and COVID 19 boosters  - Asthma control goals:  * Full participation in all desired activities (may need albuterol before activity) * Albuterol use two time or less a week on average (not counting use with activity) * Cough interfering with sleep two time or less a month * Oral steroids no more than once a year * No hospitalizations  Seasonal allergic rhinitis: Moderately well controlled  - Testing today showed positive to grass, weeds, trees - Copy of test results provided.  - Avoidance measures provided. - Continue with:  Xyzal 5 mg twice daily and Flonase (fluticasone) two sprays per nostril daily - Start taking: Singulair (montelukast) '10mg'$  daily and Astelin (azelastine) 2 sprays per nostril 1-2 times daily as needed - You can use an extra dose of the antihistamine, if needed, for breakthrough symptoms.  - Consider nasal saline rinses 1-2 times daily to remove allergens from the nasal cavities as well as help with mucous clearance (this is especially helpful to do before the nasal sprays are given) - Consider allergy shots as a means of long-term control and can reduce lifetime use of medications  - Allergy shots "re-train" and "reset" the immune system to ignore environmental allergens and decrease the resulting immune response to those allergens (sneezing, itchy watery eyes, runny nose, nasal congestion, etc).    - Allergy shots improve symptoms in 75-85%  - Allergy shots are the only potential permanent and disease modifying option  - We can discuss more at the next appointment if the medications are not working for you.   Follow up: 4 weeks   Thank you so much for letting me partake in your care today.  Don't hesitate to reach out if you have any additional concerns!  Roney Marion, MD  [facility] [place] [place]     No follow-ups on file.  Meds ordered this encounter  Medications   albuterol  (VENTOLIN HFA) 108 (90 Base) MCG/ACT inhaler    Sig: Inhale 2 puffs into the lungs every 4 (four) hours as needed for wheezing or shortness of breath.    Dispense:  18 g    Refill:  3   Lab Orders         CBC with Differential/Platelet         Comprehensive metabolic panel         Tryptase         TSH         Chronic Urticaria         Alpha-Gal Panel         Sedimentation rate      Other allergy screening: Asthma: yes Rhino conjunctivitis: yes Food allergy: no Medication allergy: no Hymenoptera allergy: no Urticaria: yes Eczema:no History of recurrent infections suggestive of immunodeficency: no  Diagnostics: Spirometry:  Tracings reviewed. Her effort: Good reproducible efforts. FVC: 2.72 L FEV1: 2.37 L, 86% predicted FEV1/FVC ratio: 87% Interpretation: Spirometry consistent with normal pattern.  Please see scanned spirometry results for details.  Skin Testing: Environmental allergy panel and select foods. Positive to grass, weeds, trees Results interpreted by myself and discussed with patient/family.  Airborne Adult Perc - 09/17/21 1023     Time Antigen Placed 1023    Allergen Manufacturer Lavella Hammock    Location Back    Number of  Test 59    1. Control-Buffer 50% Glycerol Negative    2. Control-Histamine 1 mg/ml 4+    3. Albumin saline Negative    4. Ronan Negative    5. Guatemala 2+    6. Johnson 2+    7. [place] Blue 2+    8. Meadow Fescue 3+    9. Perennial Rye 3+    10. Sweet Vernal Negative    11. Timothy 3+    12. Cocklebur 3+    13. Burweed Marshelder 3+    14. Ragweed, short 3+    15. Ragweed, Giant 3+    16. Plantain,  English 3+    17. Lamb's Quarters 3+    18. Sheep Sorrell 3+    19. Rough Pigweed 3+    20. Marsh Elder, Rough Negative    21. Mugwort, Common Negative    22. Ash mix 3+    23. Birch mix 3+    24. Beech American 3+    25. Box, Elder 3+    26. Cedar, red Negative    27. Cottonwood, Eastern 2+    28. Elm mix Negative    29.  Hickory 2+    30. Maple mix Negative    31. Oak, [place] mix Negative    32. Pecan Pollen Negative    33. Pine mix Negative    34. Sycamore Eastern Negative    35. [place], Black Pollen Negative    36. Alternaria alternata Negative    37. Cladosporium Herbarum Negative    38. Aspergillus mix Negative    39. Penicillium mix Negative    40. Bipolaris sorokiniana (Helminthosporium) Negative    41. Drechslera spicifera (Curvularia) Negative    42. Mucor plumbeus Negative    43. Fusarium moniliforme Negative    44. Aureobasidium pullulans (pullulara) Negative    45. Rhizopus oryzae Negative    46. Botrytis cinera Negative    47. Epicoccum nigrum Negative    48. Phoma betae Negative    49. Candida Albicans Negative    50. Trichophyton mentagrophytes Negative    51. Mite, D Farinae  5,000 AU/ml Negative    52. Mite, D Pteronyssinus  5,000 AU/ml Negative    53. Cat Hair 10,000 BAU/ml Negative    54.  Dog Epithelia Negative    55. Mixed Feathers Negative    56. Horse Epithelia Negative    57. Cockroach, German Negative    58. Mouse Negative    59. Tobacco Leaf Negative             Food Perc - 09/17/21 1023       Test Information   Time Antigen Placed 1023    Allergen Manufacturer Lavella Hammock    Location Back    Number of allergen test 10      Food   1. Peanut Negative    2. Soybean food Negative    3. Wheat, whole Negative    4. Sesame Negative    5. Milk, cow Negative    6. Egg White, chicken Negative    7. Casein Negative    8. Shellfish mix Negative    9. Fish mix Negative    10. Cashew Negative             Past Medical History: Patient Active Problem List   Diagnosis Date Noted   Abnormal findings on imaging of biliary tract 05/05/2021   Gastroparesis 05/05/2021   Muscle twitching 05/03/2021   Hives 01/14/2021  Delayed gastric emptying 01/14/2021   Chronic upper abdominal pain 10/10/2020   Mid back pain on right side 10/10/2020   Irritable bowel syndrome  with both constipation and diarrhea 10/10/2020   Bloating 10/10/2020   Chronic pain syndrome 10/09/2020   Pain management contract agreement 10/09/2020   Chronic right-sided low back pain without sciatica 06/12/2020   Unintended weight loss 54/62/7035   History of Helicobacter pylori infection 06/12/2020   Generalized abdominal pain 02/18/2020   Pill dysphagia 02/18/2020   Non-intractable vomiting 02/18/2020   Rectal bleeding 02/18/2020   Unintentional weight loss 02/18/2020   History of ERCP 02/18/2020   Right-sided tinnitus 08/17/2019   Black stools 07/11/2019   Leukocytes in urine 07/11/2019   Kidney stone on right side 07/11/2019   Left ovarian cyst 07/11/2019   Constipation 07/11/2019   History of cholecystectomy 03/23/2019   Drug withdrawal seizure with delirium ([place]) 02/28/2019   Elevated blood pressure reading 02/28/2019   Gastroesophageal reflux disease 02/28/2019   Moderate episode of recurrent major depressive disorder ([place]) 02/28/2019   Elevated blood pressure reading in office without diagnosis of hypertension 02/28/2019   Recurrent major depressive disorder ([place]) 02/28/2019   Benzodiazepine withdrawal ([place]) 01/08/2019   PTSD (post-traumatic stress disorder) 11/12/2018   Anxiety 11/12/2018   Myofascial pain syndrome 11/12/2018   Overactive bladder 01/21/2018   Adult ADHD 04/10/2017   Opioid use agreement exists 10/24/2016   Encounter for chronic pain management 10/24/2016   Takes daily multivitamins 09/12/2016   Patellofemoral pain syndrome 04/12/2015   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 03/15/2015   Acute stress reaction 07/08/2013   Generalized anxiety disorder 10/22/2012   Insomnia 10/22/2012   Panic attack 10/22/2012   ADHD (attention deficit hyperactivity disorder) 10/22/2012   Panic disorder without agoraphobia 10/22/2012   HSV infection 12/02/2011   Past Medical History:  Diagnosis Date   ADHD (attention deficit hyperactivity disorder)     Anxiety    Asthma    Depression    GERD (gastroesophageal reflux disease)    Headache    Insomnia    Seizure ([place]) 01/06/2019   last Oct 2020 with weeks of memory loss   Past Surgical History: Past Surgical History:  Procedure Laterality Date   APPENDECTOMY     BILIARY STENT PLACEMENT  03/24/2019   Procedure: BILIARY STENT PLACEMENT;  Surgeon: Clarene Essex, MD;  Location: [facility];  Service: Endoscopy;;   CHOLECYSTECTOMY N/A 03/17/2019   Procedure: LAPAROSCOPIC CHOLECYSTECTOMY;  Surgeon: Ralene Ok, MD;  Location: [facility];  Service: General;  Laterality: N/A;   ERCP N/A 03/24/2019   Procedure: ENDOSCOPIC RETROGRADE CHOLANGIOPANCREATOGRAPHY (ERCP);  Surgeon: Clarene Essex, MD;  Location: [facility];  Service: Endoscopy;  Laterality: N/A;   ERCP N/A 05/17/2019   Procedure: ENDOSCOPIC RETROGRADE CHOLANGIOPANCREATOGRAPHY (ERCP);  Surgeon: Clarene Essex, MD;  Location: [facility];  Service: Endoscopy;  Laterality: N/A;  with stent removal   SPHINCTEROTOMY  03/24/2019   Procedure: SPHINCTEROTOMY;  Surgeon: Clarene Essex, MD;  Location: [facility];  Service: Endoscopy;;   STENT REMOVAL  05/17/2019   Procedure: STENT REMOVAL;  Surgeon: Clarene Essex, MD;  Location: [facility];  Service: Endoscopy;;   TONSILLECTOMY AND ADENOIDECTOMY     WISDOM TOOTH EXTRACTION     Medication List:  Current Outpatient Medications  Medication Sig Dispense Refill   albuterol (VENTOLIN HFA) 108 (90 Base) MCG/ACT inhaler Inhale 2 puffs into the lungs every 6 (six) hours as needed for wheezing or shortness of breath. 8 g 0   albuterol (  VENTOLIN HFA) 108 (90 Base) MCG/ACT inhaler Inhale 2 puffs into the lungs every 4 (four) hours as needed for wheezing or shortness of breath. 18 g 3   atomoxetine (STRATTERA) 80 MG capsule TAKE 1 CAPSULE BY MOUTH EVERY DAY 90 capsule 1   clonazePAM (KLONOPIN) 0.5 MG tablet Take 1 tablet (0.5 mg total) by mouth 2 (two) times daily as needed for anxiety. 20 tablet 0    fluticasone (FLONASE) 50 MCG/ACT nasal spray Place 2 sprays into both nostrils daily. 16 g 5   hydrOXYzine (ATARAX) 50 MG tablet 1 tablet in the morning, 2 in the evening 270 tablet 0   hydrOXYzine (VISTARIL) 25 MG capsule Take 1-2 capsules (25-50 mg total) by mouth every 8 (eight) hours as needed. 60 capsule 2   levocetirizine (XYZAL) 5 MG tablet TAKE 1 TABLET BY MOUTH EVERY DAY IN THE EVENING 90 tablet 1   linaclotide (LINZESS) 145 MCG CAPS capsule TAKE 1 CAPSULE BY MOUTH DAILY BEFORE BREAKFAST. 90 capsule 3   metoCLOPramide (REGLAN) 10 MG tablet 1 tab PO TID before meals. (Patient taking differently: Take 10 mg by mouth daily before breakfast. 1 tab PO TID before meals.) 90 tablet 1   ondansetron (ZOFRAN ODT) 4 MG disintegrating tablet '4mg'$  ODT q4 hours prn nausea/vomit 20 tablet 1   Oxcarbazepine (TRILEPTAL) 300 MG tablet Take 1 tablet (300 mg total) by mouth 2 (two) times daily. 180 tablet 3   pantoprazole (PROTONIX) 40 MG tablet Take 1 tablet (40 mg total) by mouth 2 (two) times daily. 180 tablet 3   promethazine (PHENERGAN) 25 MG suppository Place 1 suppository (25 mg total) rectally every 6 (six) hours as needed for nausea or vomiting. 12 each 1   QUEtiapine (SEROQUEL) 50 MG tablet Take by mouth. 50 mg in the morning, 150 mg in the evening     tiZANidine (ZANAFLEX) 4 MG capsule TAKE 1 CAPSULE BY MOUTH 4 TIMES DAILY AS NEEDED FOR MUSCLE SPASMS. 270 capsule 1   valACYclovir (VALTREX) 500 MG tablet Take 1 tablet (500 mg total) by mouth daily. 90 tablet 1   montelukast (SINGULAIR) 10 MG tablet Take 10 mg by mouth daily. (Patient not taking: Reported on 09/17/2021)     No current facility-administered medications for this visit.   Allergies: Allergies  Allergen Reactions   Dicyclomine Other (See Comments)    Pt reports "drunk feeling"   Social History: Social History   Socioeconomic History   Marital status: Married    Spouse name: Not on file   Number of children: Not on file   Years  of education: Not on file   Highest education level: Not on file  Occupational History   Occupation: front Therapist, sports  Tobacco Use   Smoking status: Former    Packs/day: 1.00    Years: 7.00    Pack years: 7.00    Types: Cigarettes    Quit date: 10/22/2004    Years since quitting: 16.9    Passive exposure: Never   Smokeless tobacco: Never  Vaping Use   Vaping Use: Every day   Substances: Nicotine   Devices: Started 3 years ago  Substance and Sexual Activity   Alcohol use: Yes    Comment: socially   Drug use: No   Sexual activity: Yes    Partners: Male    Birth control/protection: None    Comment: Husband had vasectomy  Other Topics Concern   Not on file  Social History Narrative   Marital Status: Married Barrister's clerk)  Children:  Son Nurse, learning disability) Daughter (Remi)    Pets: None    Living Situation: Lives with husband, step-daughter & children.    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      Alcohol Use:  None   Drug Use:  None   Diet:  Regular   Exercise:  None   Hobbies: Fishing [employer])          Social Determinants of Radio broadcast assistant Strain: Not on file  Food Insecurity: No Food Insecurity   Worried About Charity fundraiser in the Last Year: Never true   Arboriculturist in the Last Year: Never true  Transportation Needs: Not on file  Physical Activity: Not on file  Stress: Not on file  Social Connections: Not on file   Lives in a single-family home is built in 24-Sep-1985, there are no roaches in the house but is 2 feet off the floor.  They do not did not have dust mite precautions on the better pillows.  She is not exposed to fumes, chemicals or dust at her job or hobby.  She does have a HEPA filter in her home and home is not near an interstate industrial area. Smoking: Active vapor Occupation: [employer]SA   Environmental  HistoryFreight forwarder in the house: no Carpet in the family room: no Carpet in the bedroom: yes Heating: electric Cooling: central Pet: yes 2 dogs without access to bedroom.   Family History: Family History  Problem Relation Age of Onset   Asthma Mother    Hypertension Mother    Stroke Mother    Allergic rhinitis Father    Sinusitis Father    Food Allergy Nephew    Colon cancer Neg Hx    Esophageal cancer Neg Hx    Inflammatory bowel disease Neg Hx    Liver disease Neg Hx    Pancreatic cancer Neg Hx    Rectal cancer Neg Hx    Stomach cancer Neg Hx    Atopy Neg Hx    Immunodeficiency Neg Hx    Urticaria Neg Hx    Angioedema Neg Hx      ROS: All others negative except as noted per HPI.   Objective: BP 118/76   Pulse (!) 58   Temp 97.7 F (36.5 C) (Temporal)   Ht '5\' 1"'$  (1.549 m)   Wt 103 lb 1.6 oz (46.8 kg)   SpO2 100%   BMI 19.48 kg/m  Body mass index is 19.48 kg/m.  General Appearance:  Alert, cooperative, no distress, appears stated age  Head:  Normocephalic, without obvious abnormality, atraumatic  Eyes:  Conjunctiva clear, EOM's intact  Nose: Nares normal, hypertrophic turbinates, no visible anterior polyps, and septum midline  Throat: Lips, tongue normal; teeth and gums normal, no tonsillar exudate and + cobblestoning  Neck: Supple, symmetrical  Lungs:   clear to auscultation bilaterally, Respirations unlabored, no coughing  Heart:  regular rate and rhythm and no murmur, Appears well perfused  Extremities: No edema  Skin: Skin color, texture, turgor normal, no rashes or lesions on visualized portions of skin  Neurologic: No gross deficits   The plan was reviewed with the patient/family, and all questions/concerned were addressed.  It was my pleasure to see Annette Cook today and participate in her care. Please feel free to contact me with any questions or  concerns.  Sincerely,  Roney Marion, MD Allergy & Immunology  [facility] office: [phone] [facility] office: [phone]

## 2021-09-17 NOTE — Addendum Note (Signed)
Addended by: Katherina Right D on: 09/17/2021 11:16 AM   Modules accepted: Orders

## 2021-09-19 ENCOUNTER — Telehealth: Payer: Self-pay

## 2021-09-19 ENCOUNTER — Other Ambulatory Visit: Payer: Self-pay | Admitting: Physician Assistant

## 2021-09-19 DIAGNOSIS — J3089 Other allergic rhinitis: Secondary | ICD-10-CM

## 2021-09-19 MED ORDER — PROMETHAZINE HCL 25 MG RE SUPP: 25.0000 mg | Freq: Four times a day (QID) | RECTAL | 1 refills | Status: DC | PRN

## 2021-09-20 LAB — HEPATIC FUNCTION PANEL
ALT: 15 U/L (ref 7–35)
AST: 13 (ref 13–35)
Alkaline Phosphatase: 34 (ref 25–125)
Bilirubin, Total: 0.4

## 2021-09-20 LAB — COMPREHENSIVE METABOLIC PANEL
Albumin: 4.5 (ref 3.5–5.0)
Calcium: 8.7 (ref 8.7–10.7)
eGFR: >90

## 2021-09-20 LAB — LAB REPORT - SCANNED
Alpha Gal IgE*: <0.1
Erythrocyte Sed Rate: <1
Tryptase: 2.2

## 2021-09-20 LAB — CBC AND DIFFERENTIAL
HCT: 40 (ref 36–46)
Hemoglobin: 13 (ref 12.0–16.0)
Neutrophils Absolute: 3
Platelets: 231 10*3/uL (ref 150–400)
WBC: 6

## 2021-09-20 LAB — CBC: RBC: 4.36 (ref 3.87–5.11)

## 2021-09-20 LAB — BASIC METABOLIC PANEL
BUN: 10 (ref 4–21)
CO2: 27 — AB (ref 13–22)
Chloride: 104 (ref 99–108)
Creatinine: 0.8 (ref 0.5–1.1)
Glucose: 46
Potassium: 4.2 mEq/L (ref 3.5–5.1)
Sodium: 137 (ref 137–147)

## 2021-09-20 LAB — TSH: TSH: 1.52 (ref 0.41–5.90)

## 2021-09-20 MED ORDER — EPINEPHRINE 0.3 MG/0.3ML IJ SOAJ: 0.3000 mg | INTRAMUSCULAR | 1 refills | Status: DC | PRN

## 2021-09-20 NOTE — Telephone Encounter (Signed)
The hives are unlikely to be related to the skin testing.  She should increase her xyzal to 2 tabs in the AM and 2 tabs in the PM.  I will place the immunotherapy order for AIT and she can schedule an appointment in about 4 weeks for her first injection.

## 2021-09-20 NOTE — Telephone Encounter (Signed)
New AIT start.  Standard build up.  Thanks!

## 2021-09-20 NOTE — Addendum Note (Signed)
Addended by: Lenoard Aden on: 09/20/2021 01:28 PM   Modules accepted: Orders

## 2021-09-30 ENCOUNTER — Other Ambulatory Visit: Payer: Self-pay | Admitting: Neurology

## 2021-09-30 DIAGNOSIS — F411 Generalized anxiety disorder: Secondary | ICD-10-CM

## 2021-09-30 DIAGNOSIS — F419 Anxiety disorder, unspecified: Secondary | ICD-10-CM

## 2021-09-30 DIAGNOSIS — J302 Other seasonal allergic rhinitis: Secondary | ICD-10-CM

## 2021-09-30 DIAGNOSIS — R253 Fasciculation: Secondary | ICD-10-CM

## 2021-09-30 MED ORDER — HYDROXYZINE PAMOATE 25 MG PO CAPS: 25.0000 mg | ORAL_CAPSULE | Freq: Three times a day (TID) | ORAL | 0 refills | Status: DC | PRN

## 2021-09-30 MED ORDER — HYDROXYZINE HCL 50 MG PO TABS: ORAL_TABLET | ORAL | 0 refills | Status: DC

## 2021-09-30 MED ORDER — TIZANIDINE HCL 4 MG PO CAPS: ORAL_CAPSULE | ORAL | 0 refills | Status: DC

## 2021-09-30 MED ORDER — PROMETHAZINE HCL 25 MG RE SUPP: 25.0000 mg | Freq: Four times a day (QID) | RECTAL | 0 refills | Status: DC | PRN

## 2021-09-30 NOTE — Progress Notes (Signed)
VIALS EXP 10-01-22

## 2021-09-30 NOTE — Progress Notes (Signed)
Aeroallergen Immunotherapy   Ordering Provider: Dr. Roney Marion   Patient Details  Name: Annette Cook  MRN: [id number]  Date of Birth: [birth year]   Order 1 of 1   Vial Label: T-G-W   0.3 ml (Volume)  BAU Concentration -- 7 Grass Mix* 100,000 ([address] [place], [place], [place], [place] Rye, RedTop, Sweet Vernal, Timothy)  0.3 ml (Volume)  BAU Concentration -- Guatemala 10,000  0.2 ml (Volume)  1:20 Concentration -- Johnson  0.3 ml (Volume)  1:20 Concentration -- Ragweed Mix  0.2 ml (Volume)  1:20 Concentration -- Cocklebur  0.2 ml (Volume)  1:20 Concentration -- Burweed Marshelder  0.5 ml (Volume)  1:20 Concentration -- Weed Mix*  0.5 ml (Volume)  1:20 Concentration -- Eastern 10 Tree Mix (also Sweet Gum)  0.2 ml (Volume)  1:20 Concentration -- Box Elder    2.7  ml Extract Subtotal  2.3  ml Diluent  5.0  ml Maintenance Total   Schedule:  B  Blue Vial (1:100,000): Schedule B (6 doses)  Yellow Vial (1:10,000): Schedule B (6 doses)  Green Vial (1:1,000): Schedule B (6 doses)  Red Vial (1:100): Schedule A (10 doses)   Special Instructions: Verify epipen at visit

## 2021-10-01 ENCOUNTER — Other Ambulatory Visit: Payer: Self-pay | Admitting: Physician Assistant

## 2021-10-03 ENCOUNTER — Encounter: Payer: Self-pay | Admitting: Internal Medicine

## 2021-10-21 ENCOUNTER — Ambulatory Visit (INDEPENDENT_AMBULATORY_CARE_PROVIDER_SITE_OTHER): Payer: PRIVATE HEALTH INSURANCE | Admitting: Internal Medicine

## 2021-10-21 ENCOUNTER — Ambulatory Visit: Payer: PRIVATE HEALTH INSURANCE

## 2021-10-21 ENCOUNTER — Encounter: Payer: Self-pay | Admitting: Internal Medicine

## 2021-10-21 VITALS — BP 92/58 | HR 86 | Temp 98.8°F | Resp 18

## 2021-10-21 DIAGNOSIS — J309 Allergic rhinitis, unspecified: Secondary | ICD-10-CM

## 2021-10-21 DIAGNOSIS — H1013 Acute atopic conjunctivitis, bilateral: Secondary | ICD-10-CM

## 2021-10-21 DIAGNOSIS — L501 Idiopathic urticaria: Secondary | ICD-10-CM | POA: Diagnosis not present

## 2021-10-21 DIAGNOSIS — J3089 Other allergic rhinitis: Secondary | ICD-10-CM

## 2021-10-21 DIAGNOSIS — H1045 Other chronic allergic conjunctivitis: Secondary | ICD-10-CM

## 2021-10-21 DIAGNOSIS — J4520 Mild intermittent asthma, uncomplicated: Secondary | ICD-10-CM

## 2021-10-21 NOTE — Progress Notes (Signed)
Follow Up Note  RE: Annette Cook MRN: [id number] DOB: Feb 24, 1986 Date of Office Visit: 10/21/2021  Referring provider: Lavada Mesi Primary care provider: Donella Stade, PA-C  Chief Complaint: Urticaria  History of Present Illness: I had the pleasure of seeing Annette Cook for a follow up visit at the [facility] on 10/21/2021. She is a 36 y.o. female, who is being followed for urticaria, mild intermittent asthma, allergic rhinitis. Her previous allergy office visit was on 09/17/2021 with Dr. Edison Pace. Today is a regular follow up visit.  History obtained from patient  and  chart review .  Urticaria  ASTHMA - Medical therapy: not on controller therapy  - Rescue inhaler use: 1 use in past 30 days  - Symptoms: denies cough, wheeze, dyspnea  - Exacerbation history: 0 ABX for respiratory illness since last visit, 0 OCS, 0 ED, 0 UC visits in the past year  - ACT: 24 /25 - Adverse effects of medication: denies  - Previous FEV1: 2.37 L, 86% - Biologic Labs not indicated   Allergic Rhinitis: current therapy: xyzal '5mg'$  twice a day,  flonase, astelin  ,  symptoms improved symptoms include:  rare ear fullness and  Previous allergy testing:  09/20/21: positive to grass, weeds, trees History of reflux/heartburn: no Interested in Allergy Immunotherapy: yes   Assessment and Plan: Annette Cook is a 36 y.o. female with: Other allergic rhinitis  Allergic rhinitis, unspecified seasonality, unspecified trigger  Other chronic allergic conjunctivitis of both eyes  Idiopathic urticaria  Mild intermittent asthma without complication Plan: Patient Instructions  Chronic Idiopathic Urticaria: not well controlled  Hive labs: 10/03/21- normal Therapy Plan:  - Increase  xyzal '5mg'$  to twice daily  - if hives are uncontrolled, increase xyzal to '10mg'$  twice daily (this is the maximum dose)  - can increase or decrease dosing depending on symptom control to a maximum dose of 4  tablets of antihistamine daily. Wait until hives free for at least one month prior to decreasing dose.     Mild intermittent asthma: well controlled   PLAN:  - Prior to physical activity: albuterol 2 puffs 10-15 minutes before physical activity. - Rescue medications: albuterol 4 puffs every 4-6 hours as needed - Get Influenza Vaccine and appropriate Pneumonia and COVID 19 boosters  - Asthma control goals:  * Full participation in all desired activities (may need albuterol before activity) * Albuterol use two time or less a week on average (not counting use with activity) * Cough interfering with sleep two time or less a month * Oral steroids no more than once a year * No hospitalizations  Seasonal allergic rhinitis: well controlled  - Continue Avoidance measures to grass, weeds, trees - Continue with:  Xyzal 5 mg twice daily, Flonase (fluticasone) two sprays per nostril daily, and Astelin (azelastine) 2 sprays per nostril 1-2 times daily as needed - You can use an extra dose of the antihistamine, if needed, for breakthrough symptoms.  - Consider nasal saline rinses 1-2 times daily to remove allergens from the nasal cavities as well as help with mucous clearance (this is especially helpful to do before the nasal sprays are given) -Continue allergy injections, once you have a point of contact at [facility] we can have you transfer your vials there   Follow up:  6 months   Thank you so much for letting me partake in your care today.  Don't hesitate to reach out if you have any additional concerns!  Roney Marion, MD  [facility] [place] [place]    No follow-ups on file.  No orders of the defined types were placed in this encounter.   Lab Orders  No laboratory test(s) ordered today   Diagnostics: None performed   Medication List:  Current Outpatient Medications  Medication Sig Dispense Refill   albuterol (VENTOLIN HFA) 108 (90 Base) MCG/ACT inhaler Inhale  2 puffs into the lungs every 6 (six) hours as needed for wheezing or shortness of breath. 8 g 0   atomoxetine (STRATTERA) 80 MG capsule TAKE 1 CAPSULE BY MOUTH EVERY DAY 90 capsule 1   azelastine (ASTELIN) 0.1 % nasal spray Take 2 sprays per nostril 1-2 times daily as needed 90 mL 1   clonazePAM (KLONOPIN) 0.5 MG tablet Take 1 tablet (0.5 mg total) by mouth 2 (two) times daily as needed for anxiety. 20 tablet 0   EPINEPHrine 0.3 mg/0.3 mL IJ SOAJ injection Inject 0.3 mg into the muscle as needed for anaphylaxis. 1 each 1   fluticasone (FLONASE) 50 MCG/ACT nasal spray Place 2 sprays into both nostrils daily. 16 g 5   hydrOXYzine (ATARAX) 50 MG tablet 1 tablet in the morning, 2 in the evening 270 tablet 0   hydrOXYzine (VISTARIL) 25 MG capsule Take 1-2 capsules (25-50 mg total) by mouth every 8 (eight) hours as needed. 60 capsule 0   levocetirizine (XYZAL) 5 MG tablet Take 1 tablet once or twice a day 90 tablet 1   linaclotide (LINZESS) 145 MCG CAPS capsule TAKE 1 CAPSULE BY MOUTH DAILY BEFORE BREAKFAST. 90 capsule 3   metoCLOPramide (REGLAN) 10 MG tablet 1 tab PO TID before meals. (Patient taking differently: Take 10 mg by mouth daily before breakfast. 1 tab PO TID before meals.) 90 tablet 1   montelukast (SINGULAIR) 10 MG tablet Take 10 mg by mouth daily. (Patient not taking: Reported on 09/17/2021)     ondansetron (ZOFRAN ODT) 4 MG disintegrating tablet '4mg'$  ODT q4 hours prn nausea/vomit 20 tablet 1   Oxcarbazepine (TRILEPTAL) 300 MG tablet Take 1 tablet (300 mg total) by mouth 2 (two) times daily. 180 tablet 3   pantoprazole (PROTONIX) 40 MG tablet Take 1 tablet (40 mg total) by mouth 2 (two) times daily. 180 tablet 3   promethazine (PHENERGAN) 25 MG suppository Place 1 suppository (25 mg total) rectally every 6 (six) hours as needed for nausea or vomiting. 12 each 0   QUEtiapine (SEROQUEL) 50 MG tablet Take by mouth. 50 mg in the morning, 150 mg in the evening     tiZANidine (ZANAFLEX) 4 MG capsule  TAKE 1 CAPSULE BY MOUTH 4 TIMES DAILY AS NEEDED FOR MUSCLE SPASMS. 270 capsule 0   valACYclovir (VALTREX) 500 MG tablet Take 1 tablet (500 mg total) by mouth daily. 90 tablet 1   No current facility-administered medications for this visit.   Allergies: Allergies  Allergen Reactions   Dicyclomine Other (See Comments)    Pt reports "drunk feeling"   I reviewed her past medical history, social history, family history, and environmental history and no significant changes have been reported from her previous visit.  ROS: All others negative except as noted per HPI.   Objective: BP (!) 92/58   Pulse 86   Temp 98.8 F (37.1 C) (Temporal)   Resp 18   SpO2 99%  There is no height or weight on file to calculate BMI. General Appearance:  Alert, cooperative, no distress, appears stated age  Head:  Normocephalic, without  obvious abnormality, atraumatic  Eyes:  Conjunctiva clear, EOM's intact  Nose: Nares normal, normal mucosa, no visible anterior polyps, and septum midline  Throat: Lips, tongue normal; teeth and gums normal, normal posterior oropharynx and no tonsillar exudate  Neck: Supple, symmetrical  Lungs:   clear to auscultation bilaterally, Respirations unlabored, no coughing  Heart:  regular rate and rhythm and no murmur, Appears well perfused  Extremities: No edema  Skin: Skin color, texture, turgor normal, no rashes or lesions on visualized portions of skin  Neurologic: No gross deficits   Previous notes and tests were reviewed. The plan was reviewed with the patient/family, and all questions/concerned were addressed.  It was my pleasure to see Annette Cook today and participate in her care. Please feel free to contact me with any questions or concerns.  Sincerely,  Roney Marion, MD  Allergy & Immunology  [facility] [place] [facility] Office: [phone]

## 2021-10-21 NOTE — Progress Notes (Signed)
Immunotherapy   Patient Details  Name: Annette Cook MRN: [id number] Date of Birth: October 29, 1985  10/21/2021  Annette Cook started injections for Blue 1:100,000 (T-G-W) Following schedule: B  Frequency:1 time per week Epi-Pen:Epi-Pen Available  Consent signed and patient instructions given.   Alam Guterrez J Trichelle Lehan 10/21/2021, 8:43 AM

## 2021-10-21 NOTE — Patient Instructions (Addendum)
Chronic Idiopathic Urticaria: not well controlled  Hive labs: 10/03/21- normal Therapy Plan:  - Increase  xyzal '5mg'$  to twice daily  - if hives are uncontrolled, increase xyzal to '10mg'$  twice daily (this is the maximum dose)  - can increase or decrease dosing depending on symptom control to a maximum dose of 4 tablets of antihistamine daily. Wait until hives free for at least one month prior to decreasing dose.     Mild intermittent asthma: well controlled   PLAN:  - Prior to physical activity: albuterol 2 puffs 10-15 minutes before physical activity. - Rescue medications: albuterol 4 puffs every 4-6 hours as needed - Get Influenza Vaccine and appropriate Pneumonia and COVID 19 boosters  - Asthma control goals:  * Full participation in all desired activities (may need albuterol before activity) * Albuterol use two time or less a week on average (not counting use with activity) * Cough interfering with sleep two time or less a month * Oral steroids no more than once a year * No hospitalizations  Seasonal allergic rhinitis: well controlled  - Continue Avoidance measures to grass, weeds, trees - Continue with:  Xyzal 5 mg twice daily, Flonase (fluticasone) two sprays per nostril daily, and Astelin (azelastine) 2 sprays per nostril 1-2 times daily as needed - You can use an extra dose of the antihistamine, if needed, for breakthrough symptoms.  - Consider nasal saline rinses 1-2 times daily to remove allergens from the nasal cavities as well as help with mucous clearance (this is especially helpful to do before the nasal sprays are given) -Continue allergy injections, once you have a point of contact at [facility] we can have you transfer your vials there   Follow up:  6 months   Thank you so much for letting me partake in your care today.  Don't hesitate to reach out if you have any additional concerns!  Roney Marion, MD  [facility] [place], [place]

## 2021-10-28 ENCOUNTER — Telehealth: Payer: Self-pay | Admitting: Neurology

## 2021-10-28 DIAGNOSIS — Z0289 Encounter for other administrative examinations: Secondary | ICD-10-CM

## 2021-10-28 DIAGNOSIS — M549 Dorsalgia, unspecified: Secondary | ICD-10-CM

## 2021-10-28 DIAGNOSIS — M545 Low back pain, unspecified: Secondary | ICD-10-CM

## 2021-10-28 DIAGNOSIS — R1084 Generalized abdominal pain: Secondary | ICD-10-CM

## 2021-10-28 DIAGNOSIS — G894 Chronic pain syndrome: Secondary | ICD-10-CM

## 2021-10-28 MED ORDER — HYDROCODONE-ACETAMINOPHEN 5-325 MG PO TABS: 1.0000 | ORAL_TABLET | Freq: Four times a day (QID) | ORAL | 0 refills | Status: AC | PRN

## 2021-10-28 NOTE — Telephone Encounter (Signed)
Needs Hydrocodone refilled, LVM. Not on current med list. Please advise.

## 2021-11-13 ENCOUNTER — Other Ambulatory Visit: Payer: Self-pay | Admitting: Physician Assistant

## 2021-11-13 DIAGNOSIS — F900 Attention-deficit hyperactivity disorder, predominantly inattentive type: Secondary | ICD-10-CM

## 2021-11-14 NOTE — Telephone Encounter (Signed)
Received call from pharmacy stating Tizanidine capsule not covered by insurance. Requesting permission to switch to tablets. Permission granted.

## 2021-11-27 ENCOUNTER — Other Ambulatory Visit: Payer: Self-pay | Admitting: Physician Assistant

## 2021-12-03 ENCOUNTER — Telehealth: Payer: Self-pay

## 2021-12-04 MED ORDER — CLONAZEPAM 0.5 MG PO TABS: 0.5000 mg | ORAL_TABLET | Freq: Two times a day (BID) | ORAL | 0 refills | Status: DC | PRN

## 2021-12-04 MED ORDER — HYDROCODONE-ACETAMINOPHEN 5-325 MG PO TABS: 1.0000 | ORAL_TABLET | Freq: Four times a day (QID) | ORAL | 0 refills | Status: DC | PRN

## 2021-12-04 NOTE — Telephone Encounter (Signed)
..  PDMP reviewed during this encounter. No concerns Use klonapin sparingly and NOT with norco Pt does need appt. Refills sent

## 2022-01-25 ENCOUNTER — Other Ambulatory Visit: Payer: Self-pay | Admitting: Physician Assistant

## 2022-01-25 DIAGNOSIS — R253 Fasciculation: Secondary | ICD-10-CM

## 2022-01-25 DIAGNOSIS — F419 Anxiety disorder, unspecified: Secondary | ICD-10-CM

## 2022-01-28 ENCOUNTER — Other Ambulatory Visit: Payer: Self-pay | Admitting: Neurology

## 2022-01-28 DIAGNOSIS — R0982 Postnasal drip: Secondary | ICD-10-CM

## 2022-01-28 DIAGNOSIS — J302 Other seasonal allergic rhinitis: Secondary | ICD-10-CM

## 2022-01-28 MED ORDER — PROMETHAZINE HCL 25 MG RE SUPP: 25.0000 mg | Freq: Four times a day (QID) | RECTAL | 1 refills | Status: DC | PRN

## 2022-02-03 ENCOUNTER — Other Ambulatory Visit: Payer: Self-pay | Admitting: Physician Assistant

## 2022-02-03 DIAGNOSIS — R253 Fasciculation: Secondary | ICD-10-CM

## 2022-02-10 ENCOUNTER — Other Ambulatory Visit: Payer: Self-pay | Admitting: Physician Assistant

## 2022-02-10 DIAGNOSIS — F900 Attention-deficit hyperactivity disorder, predominantly inattentive type: Secondary | ICD-10-CM

## 2022-02-10 DIAGNOSIS — Z0289 Encounter for other administrative examinations: Secondary | ICD-10-CM

## 2022-02-10 DIAGNOSIS — R1084 Generalized abdominal pain: Secondary | ICD-10-CM

## 2022-02-10 DIAGNOSIS — G894 Chronic pain syndrome: Secondary | ICD-10-CM

## 2022-02-10 DIAGNOSIS — M549 Dorsalgia, unspecified: Secondary | ICD-10-CM

## 2022-02-10 DIAGNOSIS — G8929 Other chronic pain: Secondary | ICD-10-CM

## 2022-02-10 MED ORDER — HYDROCODONE-ACETAMINOPHEN 5-325 MG PO TABS: 1.0000 | ORAL_TABLET | Freq: Four times a day (QID) | ORAL | 0 refills | Status: AC | PRN

## 2022-02-10 MED ORDER — PROMETHAZINE HCL 25 MG RE SUPP: 25.0000 mg | Freq: Four times a day (QID) | RECTAL | 1 refills | Status: DC | PRN

## 2022-02-10 NOTE — Progress Notes (Signed)
..  PDMP reviewed during this encounter. Norco refilled NEEDS OV

## 2022-02-14 ENCOUNTER — Encounter: Payer: Self-pay | Admitting: Physician Assistant

## 2022-02-14 ENCOUNTER — Ambulatory Visit (INDEPENDENT_AMBULATORY_CARE_PROVIDER_SITE_OTHER): Payer: PRIVATE HEALTH INSURANCE | Admitting: Physician Assistant

## 2022-02-14 VITALS — BP 159/95 | HR 97 | Ht 61.0 in | Wt 99.0 lb

## 2022-02-14 DIAGNOSIS — Z0289 Encounter for other administrative examinations: Secondary | ICD-10-CM | POA: Diagnosis not present

## 2022-02-14 DIAGNOSIS — F411 Generalized anxiety disorder: Secondary | ICD-10-CM

## 2022-02-14 DIAGNOSIS — G894 Chronic pain syndrome: Secondary | ICD-10-CM

## 2022-02-14 DIAGNOSIS — R399 Unspecified symptoms and signs involving the genitourinary system: Secondary | ICD-10-CM

## 2022-02-14 DIAGNOSIS — G4700 Insomnia, unspecified: Secondary | ICD-10-CM

## 2022-02-14 DIAGNOSIS — F900 Attention-deficit hyperactivity disorder, predominantly inattentive type: Secondary | ICD-10-CM | POA: Diagnosis not present

## 2022-02-14 DIAGNOSIS — K5909 Other constipation: Secondary | ICD-10-CM

## 2022-02-14 LAB — POCT URINALYSIS DIP (CLINITEK)
Bilirubin, UA: NEGATIVE
Blood, UA: NEGATIVE
Glucose, UA: NEGATIVE mg/dL
Ketones, POC UA: NEGATIVE mg/dL
Leukocytes, UA: NEGATIVE
Nitrite, UA: NEGATIVE
POC PROTEIN,UA: NEGATIVE
Spec Grav, UA: 1.01 (ref 1.010–1.025)
Urobilinogen, UA: 0.2 E.U./dL
pH, UA: 7 (ref 5.0–8.0)

## 2022-02-14 MED ORDER — BELSOMRA 10 MG PO TABS: 1.0000 | ORAL_TABLET | Freq: Every day | ORAL | 0 refills | Status: DC

## 2022-02-14 MED ORDER — HYDROXYZINE HCL 50 MG PO TABS: ORAL_TABLET | ORAL | 1 refills | Status: DC

## 2022-02-14 MED ORDER — ATOMOXETINE HCL 80 MG PO CAPS: ORAL_CAPSULE | ORAL | 3 refills | Status: DC

## 2022-02-14 NOTE — Progress Notes (Unsigned)
Established Patient Office Visit  Subjective   Patient ID: Annette Cook, female    DOB: [birth year]  Age: 36 y.o. MRN: [id number]  Chief Complaint  Patient presents with   Follow-up    HPI Pt is a 35 yo female with Chronic pain, Chronic constipation, Anxiety, Depression, ADHD, insomnia who presents to the clinic for follow up and medication refills.   Overall patient is doing well. She continues to have intermittent severe abdominal pain from time to time. She has had full work up with GI. Norco when she has the flares is all that will work. She just got refills and does not need anymore.   Strattera controlling ADHD symptoms.  She sees [facility] for depression and anxiety.   She is taking a lot of hydroxyzine at bedtime to sleep. She is waking up with UTI symptoms that seem to go away during the day. No fever, chills, body aches.   Chronic constipation controlled on linzess.   .. 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 03/15/2015   Patellofemoral pain syndrome 04/12/2015   Takes daily multivitamins 09/12/2016   Opioid use agreement exists 10/24/2016   Encounter for chronic pain management 10/24/2016   Adult ADHD 04/10/2017   Overactive bladder 01/21/2018   PTSD (post-traumatic stress disorder) 11/12/2018   Anxiety 11/12/2018   Myofascial pain syndrome 11/12/2018   Drug withdrawal seizure with delirium ([place]) 02/28/2019   Elevated blood pressure reading 02/28/2019   Gastroesophageal reflux disease 02/28/2019   Moderate episode of recurrent major depressive disorder ([place]) 02/28/2019   History of cholecystectomy 03/23/2019   Black stools 07/11/2019   Leukocytes in urine 07/11/2019   Kidney stone on right  side 07/11/2019   Left ovarian cyst 07/11/2019   Chronic constipation 07/11/2019   Generalized abdominal pain 02/18/2020   Pill dysphagia 02/18/2020   Non-intractable vomiting 02/18/2020   Rectal bleeding 02/18/2020   Unintentional weight loss 02/18/2020   History of ERCP 02/18/2020   Chronic right-sided low back pain without sciatica 06/12/2020   Unintended weight loss 42/68/3419   History of Helicobacter pylori infection 06/12/2020   Chronic pain syndrome 10/09/2020   Pain management contract agreement 10/09/2020   Chronic upper abdominal pain 10/10/2020   Mid back pain on right side 10/10/2020   Irritable bowel syndrome with both constipation and diarrhea 10/10/2020   Bloating 10/10/2020   Hives 01/14/2021   Delayed gastric emptying 01/14/2021   Muscle twitching 05/03/2021   Abnormal findings on imaging of biliary tract 05/05/2021   Benzodiazepine withdrawal ([place]) 01/08/2019   Elevated blood pressure reading in office without diagnosis of hypertension 02/28/2019   Gastroparesis 05/05/2021   Panic disorder without agoraphobia 10/22/2012   Recurrent major depressive disorder ([place]) 02/28/2019   Right-sided tinnitus 08/17/2019   Resolved Ambulatory Problems    Diagnosis Date Noted   Supervision of normal IUP (intrauterine pregnancy) in multigravida 11/09/2011   Rh negative status during pregnancy 02/03/2012   Marginal placenta previa 02/03/2012   Pain in throat 04/18/2013   Encounter for supervision of other normal pregnancy in first trimester 12/07/2013   Anxiety during pregnancy in second trimester, antepartum 01/13/2014   Active labor 06/01/2014   Vaginal delivery 06/01/2014   Shoulder dystocia, delivered, current hospitalization 06/01/2014  Bilateral hand numbness 01/16/2015   Cough 02/21/2015   Cervical radiculopathy at C8 02/27/2015   Otitis, externa, infective 03/15/2015   Anxiety and depression 09/03/2015   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    Objective:     BP (!) 159/95   Pulse 97   Ht '5\' 1"'$  (1.549 m)   Wt 99 lb (44.9 kg)   SpO2 100%   BMI 18.71 kg/m  BP Readings from Last 3 Encounters:  02/14/22 (!) 159/95  10/21/21 (!) 92/58  09/17/21 118/76   Wt Readings from Last 3 Encounters:  02/14/22 99 lb (44.9 kg)  09/17/21 103 lb 1.6 oz (46.8 kg)  04/29/21 101 lb (45.8 kg)  .[name]    02/14/2022    3:57 PM 04/29/2021    3:20 PM 04/22/2021    4:32 PM 01/10/2021    4:43 PM 10/09/2020    5:00 PM  Depression screen PHQ 2/9  Decreased Interest 2 1 0 1 1  Down, Depressed, Hopeless 3 1 0 1 0  PHQ - 2 Score 5 2 0 2 1  Altered sleeping '3 3  3 3  '$ Tired, decreased energy '1 1  1 1  '$ Change in appetite '1 3  1 1  '$ Feeling bad or failure about yourself  0 0  0 1  Trouble concentrating '2 1  2 3  '$ Moving slowly or fidgety/restless 0 '1  1 1  '$ Suicidal thoughts 0 0  0 0  PHQ-9 Score '12 11  10 11  '$ Difficult doing work/chores Very difficult Somewhat difficult  Somewhat difficult Somewhat difficult   .[name]    02/14/2022    3:58 PM 04/29/2021    3:20 PM 01/10/2021    4:43 PM 10/09/2020    5:01 PM  GAD 7 : Generalized Anxiety Score  Nervous, Anxious, on Edge '3 3 3 3  '$ Control/stop worrying '1 2 3 2  '$ Worry too much - different things '1 2 3 2  '$ Trouble relaxing '3 3 3 3  '$ Restless '3 2 3 2  '$ Easily annoyed or irritable '1 1 3 2  '$ Afraid - awful might happen 0 0 3 2  Total GAD 7 Score '12 13 21 16  '$ Anxiety Difficulty Very difficult Somewhat difficult Very difficult Somewhat difficult       .[name] Results for orders placed or performed in visit on 02/14/22  POCT URINALYSIS DIP (CLINITEK)  Result  Value Ref Range   Color, UA yellow yellow   Clarity, UA clear clear   Glucose, UA negative negative mg/dL   Bilirubin, UA negative negative   Ketones, POC UA negative negative mg/dL   Spec Grav, UA 1.010 1.010 - 1.025   Blood, UA negative negative   pH, UA 7.0 5.0 - 8.0   POC PROTEIN,UA negative negative, trace   Urobilinogen, UA 0.2 0.2 or 1.0 E.U./dL   Nitrite, UA Negative Negative   Leukocytes, UA Negative Negative    Physical Exam Constitutional:      Appearance: Normal appearance.  HENT:     Head: Normocephalic.  Cardiovascular:  Rate and Rhythm: Normal rate and regular rhythm.     Pulses: Normal pulses.  Pulmonary:     Effort: Pulmonary effort is normal.     Breath sounds: Normal breath sounds.  Abdominal:     General: There is no distension.     Palpations: Abdomen is soft.     Tenderness: There is no abdominal tenderness. There is no right CVA tenderness, left CVA tenderness or guarding.  Musculoskeletal:     Right lower leg: No edema.     Left lower leg: No edema.  Neurological:     General: No focal deficit present.     Mental Status: She is alert and oriented to person, place, and time.  Psychiatric:        Mood and Affect: Mood normal.          Assessment & Plan:  Marland KitchenMarland KitchenSantina was seen today for follow-up.  Diagnoses and all orders for this visit:  Pain management contract agreement  UTI symptoms -     POCT URINALYSIS DIP (CLINITEK)  Attention deficit hyperactivity disorder (ADHD), predominantly inattentive type -     atomoxetine (STRATTERA) 80 MG capsule; TAKE 1 CAPSULE BY MOUTH EVERY DAY, NEEDS APPOINTMENT  Generalized anxiety disorder -     hydrOXYzine (ATARAX) 50 MG tablet; 1 tablet in the morning, 2 in the evening  Chronic pain syndrome  Chronic constipation  Insomnia, unspecified type -     Suvorexant (BELSOMRA) 10 MG TABS; Take 1 tablet by mouth at bedtime.   UA negative for any concerning features Suspect hydroxyzine causing  urinary symptoms Lets cut back and try belsomra   .Marland KitchenPDMP reviewed during this encounter. No concerns Pain contract up dated for no more than 30 norco a month. Does not need refill Needs 3 month follow up  Strattera refilled.    Iran Planas, PA-C

## 2022-02-17 ENCOUNTER — Encounter: Payer: Self-pay | Admitting: Physician Assistant

## 2022-03-20 ENCOUNTER — Other Ambulatory Visit: Payer: Self-pay | Admitting: Neurology

## 2022-03-20 MED ORDER — HYDROCODONE-ACETAMINOPHEN 5-325 MG PO TABS: 1.0000 | ORAL_TABLET | Freq: Four times a day (QID) | ORAL | 0 refills | Status: AC | PRN

## 2022-03-20 NOTE — Telephone Encounter (Signed)
Last written 02/10/2022 for one month no refills  On pain contract  Last appt 02/14/2022

## 2022-04-08 ENCOUNTER — Other Ambulatory Visit: Payer: Self-pay | Admitting: Physician Assistant

## 2022-04-08 DIAGNOSIS — F900 Attention-deficit hyperactivity disorder, predominantly inattentive type: Secondary | ICD-10-CM

## 2022-04-18 ENCOUNTER — Other Ambulatory Visit: Payer: Self-pay | Admitting: Physician Assistant

## 2022-04-18 DIAGNOSIS — R253 Fasciculation: Secondary | ICD-10-CM

## 2022-05-07 MED ORDER — HYDROCODONE-ACETAMINOPHEN 5-325 MG PO TABS: 1.0000 | ORAL_TABLET | Freq: Four times a day (QID) | ORAL | 0 refills | Status: DC | PRN

## 2022-05-07 NOTE — Telephone Encounter (Signed)
..  PDMP reviewed during this encounter. No concerns.  Last refill was 03/20/2022 On pain contract 3 month OV UTD Refilled norco.

## 2022-05-15 ENCOUNTER — Other Ambulatory Visit: Payer: Self-pay | Admitting: Neurology

## 2022-05-15 MED ORDER — QUETIAPINE FUMARATE 50 MG PO TABS: ORAL_TABLET | ORAL | 5 refills | Status: DC

## 2022-05-16 MED ORDER — HYDROCHLOROTHIAZIDE 12.5 MG PO TABS: 12.5000 mg | ORAL_TABLET | Freq: Every day | ORAL | 1 refills | Status: DC

## 2022-05-16 NOTE — Telephone Encounter (Signed)
Start HCTZ for HTN. Follow up in 2 weeks. Called in with readings 150s over 110s but this morning 148/92.

## 2022-06-13 ENCOUNTER — Telehealth: Payer: Self-pay | Admitting: Physician Assistant

## 2022-06-13 DIAGNOSIS — F411 Generalized anxiety disorder: Secondary | ICD-10-CM

## 2022-06-13 MED ORDER — HYDROCODONE-ACETAMINOPHEN 5-325 MG PO TABS: 1.0000 | ORAL_TABLET | Freq: Four times a day (QID) | ORAL | 0 refills | Status: AC | PRN

## 2022-06-13 MED ORDER — HYDROXYZINE HCL 50 MG PO TABS: ORAL_TABLET | ORAL | 1 refills | Status: DC

## 2022-06-13 NOTE — Telephone Encounter (Signed)
..  PDMP reviewed during this encounter. No concerns.  Refilled norco

## 2022-07-17 ENCOUNTER — Other Ambulatory Visit: Payer: Self-pay | Admitting: Physician Assistant

## 2022-07-17 DIAGNOSIS — R253 Fasciculation: Secondary | ICD-10-CM

## 2022-07-25 ENCOUNTER — Other Ambulatory Visit: Payer: Self-pay | Admitting: Physician Assistant

## 2022-07-25 DIAGNOSIS — R253 Fasciculation: Secondary | ICD-10-CM

## 2022-07-25 MED ORDER — TIZANIDINE HCL 4 MG PO TABS: ORAL_TABLET | ORAL | 0 refills | Status: DC

## 2022-07-25 MED ORDER — HYDROCODONE-ACETAMINOPHEN 5-325 MG PO TABS: 1.0000 | ORAL_TABLET | Freq: Four times a day (QID) | ORAL | 0 refills | Status: AC | PRN

## 2022-07-25 MED ORDER — PROMETHAZINE HCL 25 MG RE SUPP: 25.0000 mg | Freq: Four times a day (QID) | RECTAL | 1 refills | Status: DC | PRN

## 2022-07-25 NOTE — Progress Notes (Signed)
..  PDMP reviewed during this encounter. No concerns Pain contract UTD Norco sent

## 2022-08-18 ENCOUNTER — Other Ambulatory Visit: Payer: Self-pay | Admitting: Family Medicine

## 2022-08-18 DIAGNOSIS — R253 Fasciculation: Secondary | ICD-10-CM

## 2022-08-20 ENCOUNTER — Ambulatory Visit: Payer: PRIVATE HEALTH INSURANCE | Admitting: Physician Assistant

## 2022-08-25 ENCOUNTER — Telehealth: Payer: Self-pay | Admitting: Physician Assistant

## 2022-08-25 NOTE — Telephone Encounter (Signed)
Pt called. She is requesting a refill on Tizanidine 4 mg and Norco 5-325mg -[facility] [place].

## 2022-08-26 ENCOUNTER — Other Ambulatory Visit: Payer: Self-pay | Admitting: Family Medicine

## 2022-08-26 ENCOUNTER — Other Ambulatory Visit: Payer: Self-pay

## 2022-08-26 DIAGNOSIS — R253 Fasciculation: Secondary | ICD-10-CM

## 2022-08-26 MED ORDER — TIZANIDINE HCL 4 MG PO TABS
4.0000 mg | ORAL_TABLET | Freq: Four times a day (QID) | ORAL | 0 refills | Status: DC | PRN
Start: 2022-08-26 — End: 2022-09-01

## 2022-08-26 MED ORDER — TIZANIDINE HCL 4 MG PO TABS
ORAL_TABLET | ORAL | 0 refills | Status: DC
Start: 2022-08-26 — End: 2022-10-10

## 2022-08-26 MED ORDER — HYDROCODONE-ACETAMINOPHEN 5-325 MG PO TABS: ORAL_TABLET | ORAL | 0 refills | Status: DC

## 2022-08-26 NOTE — Telephone Encounter (Signed)
Pt due for office visit.

## 2022-08-26 NOTE — Telephone Encounter (Signed)
..  PDMP reviewed during this encounter. Appt may 20th No concerns Sent refills

## 2022-08-26 NOTE — Telephone Encounter (Addendum)
error 

## 2022-08-26 NOTE — Progress Notes (Signed)
Received call from on-call nurse. Tizanidine prescription sent to [facility] pharmacy today. [facility] [place] [facility]. One day supply of tizanidine 4 mg # 4 tablets with [facility]ills [facility] to [facility] pharmacy to pick up prescription on 08/27/22. On-call RN to call [facility] to inform of this.

## 2022-09-01 ENCOUNTER — Ambulatory Visit (INDEPENDENT_AMBULATORY_CARE_PROVIDER_SITE_OTHER): Payer: PRIVATE HEALTH INSURANCE | Admitting: Physician Assistant

## 2022-09-01 ENCOUNTER — Encounter: Payer: Self-pay | Admitting: Physician Assistant

## 2022-09-01 VITALS — BP 155/110 | HR 110 | Ht 61.0 in | Wt 99.0 lb

## 2022-09-01 DIAGNOSIS — Z1159 Encounter for screening for other viral diseases: Secondary | ICD-10-CM

## 2022-09-01 DIAGNOSIS — B009 Herpesviral infection, unspecified: Secondary | ICD-10-CM

## 2022-09-01 DIAGNOSIS — F900 Attention-deficit hyperactivity disorder, predominantly inattentive type: Secondary | ICD-10-CM

## 2022-09-01 DIAGNOSIS — I10 Essential (primary) hypertension: Secondary | ICD-10-CM

## 2022-09-01 DIAGNOSIS — Z13220 Encounter for screening for lipoid disorders: Secondary | ICD-10-CM

## 2022-09-01 DIAGNOSIS — Z131 Encounter for screening for diabetes mellitus: Secondary | ICD-10-CM

## 2022-09-01 DIAGNOSIS — R110 Nausea: Secondary | ICD-10-CM

## 2022-09-01 DIAGNOSIS — Z0000 Encounter for general adult medical examination without abnormal findings: Secondary | ICD-10-CM | POA: Diagnosis not present

## 2022-09-01 DIAGNOSIS — R569 Unspecified convulsions: Secondary | ICD-10-CM

## 2022-09-01 DIAGNOSIS — K219 Gastro-esophageal reflux disease without esophagitis: Secondary | ICD-10-CM

## 2022-09-01 DIAGNOSIS — F19931 Other psychoactive substance use, unspecified with withdrawal delirium: Secondary | ICD-10-CM

## 2022-09-01 MED ORDER — OXCARBAZEPINE 300 MG PO TABS: 300.0000 mg | ORAL_TABLET | Freq: Every day | ORAL | 3 refills | Status: DC

## 2022-09-01 MED ORDER — VALACYCLOVIR HCL 500 MG PO TABS: 500.0000 mg | ORAL_TABLET | Freq: Every day | ORAL | 1 refills | Status: DC

## 2022-09-01 MED ORDER — PANTOPRAZOLE SODIUM 40 MG PO TBEC: 40.0000 mg | DELAYED_RELEASE_TABLET | Freq: Two times a day (BID) | ORAL | 3 refills | Status: DC

## 2022-09-01 MED ORDER — ONDANSETRON 4 MG PO TBDP
ORAL_TABLET | ORAL | 1 refills | Status: AC
Start: 2022-09-01 — End: ?

## 2022-09-01 MED ORDER — AMLODIPINE BESYLATE 2.5 MG PO TABS
2.5000 mg | ORAL_TABLET | Freq: Every day | ORAL | 1 refills | Status: DC
Start: 2022-09-01 — End: 2022-12-05

## 2022-09-01 MED ORDER — ATOMOXETINE HCL 80 MG PO CAPS
ORAL_CAPSULE | ORAL | 3 refills | Status: DC
Start: 2022-09-01 — End: 2022-09-12

## 2022-09-01 NOTE — Progress Notes (Signed)
Established Patient Office Visit  Subjective   Patient ID: Annette Cook, female    DOB: 1985/12/01  Age: 37 y.o. MRN: [id number]  Chief Complaint  Patient presents with   Annual Exam    HPI Pt is a 37 yo female who presents to the clinic to follow up on labs and medication. She does need refills.   She has noticed BP increasing over the past few weeks. No CP, palpitations, headaches or vision changes. No new medications. No edema or SOB.   Not seeing GI anymore. Intermittent flares of abdominal pain. Bowel movements soft and normal.    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 03/15/2015   Patellofemoral pain syndrome 04/12/2015   Takes daily multivitamins 09/12/2016   Opioid use agreement exists 10/24/2016   Encounter for chronic pain management 10/24/2016   Adult ADHD 04/10/2017   Overactive bladder 01/21/2018   PTSD (post-traumatic stress disorder) 11/12/2018   Anxiety 11/12/2018   Myofascial pain syndrome 11/12/2018   Drug withdrawal seizure with delirium (HCC) 02/28/2019   Elevated blood pressure reading 02/28/2019   Gastroesophageal reflux disease 02/28/2019   Moderate episode of recurrent major depressive disorder (HCC) 02/28/2019   History of cholecystectomy 03/23/2019   Black stools 07/11/2019   Leukocytes in urine 07/11/2019   Kidney stone on right side 07/11/2019   Left ovarian cyst 07/11/2019   Chronic constipation 07/11/2019   Generalized abdominal pain 02/18/2020   Pill dysphagia 02/18/2020   Non-intractable vomiting 02/18/2020   Rectal bleeding 02/18/2020   Unintentional weight loss 02/18/2020   History of ERCP 02/18/2020   Chronic right-sided low back pain without sciatica  06/12/2020   Unintended weight loss 06/12/2020   History of Helicobacter pylori infection 06/12/2020   Chronic pain syndrome 10/09/2020   Pain management contract agreement 10/09/2020   Chronic upper abdominal pain 10/10/2020   Mid back pain on right side 10/10/2020   Irritable bowel syndrome with both constipation and diarrhea 10/10/2020   Bloating 10/10/2020   Hives 01/14/2021   Delayed gastric emptying 01/14/2021   Muscle twitching 05/03/2021   Abnormal findings on imaging of biliary tract 05/05/2021   Benzodiazepine withdrawal (HCC) 01/08/2019   Elevated blood pressure reading in office without diagnosis of hypertension 02/28/2019   Gastroparesis 05/05/2021   Panic disorder without agoraphobia 10/22/2012   Recurrent major depressive disorder (HCC) 02/28/2019   Right-sided tinnitus 08/17/2019   Resolved Ambulatory Problems    Diagnosis Date Noted   Supervision of normal IUP (intrauterine pregnancy) in multigravida 11/09/2011   Rh negative status during pregnancy 02/03/2012   Marginal placenta previa 02/03/2012   Pain in throat 04/18/2013   Encounter for supervision of other normal pregnancy in first trimester 12/07/2013   Anxiety during pregnancy in second trimester, antepartum 01/13/2014   Active labor 06/01/2014   Vaginal delivery 06/01/2014   Shoulder dystocia, delivered, current hospitalization 06/01/2014   Bilateral hand numbness 01/16/2015   Cough 02/21/2015   Cervical radiculopathy at C8 02/27/2015   Otitis, externa, infective 03/15/2015   Anxiety and depression 09/03/2015   Ulnar nerve entrapment 09/12/2016   Polypharmacy 10/24/2016   Influenza-like illness 05/13/2017   Neck pain 11/12/2018   Chronic neck pain 02/28/2019   Upper back pain 06/07/2019  DDD (degenerative disc disease), cervical 06/07/2019   Past Medical History:  Diagnosis Date   Asthma    Depression    GERD (gastroesophageal reflux disease)    Headache    Seizure (HCC) 01/06/2019        ROS   See HPI.  Objective:     BP (!) 155/110   Pulse (!) 110   Ht 5\' 1"  (1.549 m)   Wt 99 lb (44.9 kg)   SpO2 99%   BMI 18.71 kg/m  BP Readings from Last 3 Encounters:  09/01/22 (!) 155/110  02/14/22 (!) 159/95  10/21/21 (!) 92/58   Wt Readings from Last 3 Encounters:  09/01/22 99 lb (44.9 kg)  02/14/22 99 lb (44.9 kg)  09/17/21 103 lb 1.6 oz (46.8 kg)    .[name]    02/14/2022    3:57 PM 04/29/2021    3:20 PM 04/22/2021    4:32 PM 01/10/2021    4:43 PM 10/09/2020    5:00 PM  Depression screen PHQ 2/9  Decreased Interest 2 1 0 1 1  Down, Depressed, Hopeless 3 1 0 1 0  PHQ - 2 Score 5 2 0 2 1  Altered sleeping 3 3  3 3   Tired, decreased energy 1 1  1 1   Change in appetite 1 3  1 1   Feeling bad or failure about yourself  0 0  0 1  Trouble concentrating 2 1  2 3   Moving slowly or fidgety/restless 0 1  1 1   Suicidal thoughts 0 0  0 0  PHQ-9 Score 12 11  10 11   Difficult doing work/chores Very difficult Somewhat difficult  Somewhat difficult Somewhat difficult   .[name]    02/14/2022    3:58 PM 04/29/2021    3:20 PM 01/10/2021    4:43 PM 10/09/2020    5:01 PM  GAD 7 : Generalized Anxiety Score  Nervous, Anxious, on Edge 3 3 3 3   Control/stop worrying 1 2 3 2   Worry too much - different things 1 2 3 2   Trouble relaxing 3 3 3 3   Restless 3 2 3 2   Easily annoyed or irritable 1 1 3 2   Afraid - awful might happen 0 0 3 2  Total GAD 7 Score 12 13 21 16   Anxiety Difficulty Very difficult Somewhat difficult Very difficult Somewhat difficult      Physical Exam Constitutional:      Appearance: Normal appearance.  HENT:     Head: Normocephalic.  Cardiovascular:     Rate and Rhythm: Normal rate and regular rhythm.  Pulmonary:     Effort: Pulmonary effort is normal.     Breath sounds: Normal breath sounds.  Musculoskeletal:        General: Normal range of motion.     Right lower leg: No edema.     Left lower leg: No edema.  Neurological:     General: No focal  deficit present.     Mental Status: She is alert and oriented to person, place, and time.  Psychiatric:        Mood and Affect: Mood normal.       Assessment & Plan:  Marland KitchenMarland KitchenDealie was seen today for annual exam.  Diagnoses and all orders for this visit:  Preventative health care -     TSH -     Lipid Panel w/reflex Direct LDL -     COMPLETE METABOLIC PANEL WITH GFR -     CBC with Differential/Platelet -  pantoprazole (PROTONIX) 40 MG tablet; Take 1 tablet (40 mg total) by mouth 2 (two) times daily. -     Hepatitis C Antibody  Attention deficit hyperactivity disorder (ADHD), predominantly inattentive type -     atomoxetine (STRATTERA) 80 MG capsule; TAKE 1 CAPSULE BY MOUTH EVERY DAY. -     pantoprazole (PROTONIX) 40 MG tablet; Take 1 tablet (40 mg total) by mouth 2 (two) times daily. -     Hepatitis C Antibody  HSV infection -     valACYclovir (VALTREX) 500 MG tablet; Take 1 tablet (500 mg total) by mouth daily. -     pantoprazole (PROTONIX) 40 MG tablet; Take 1 tablet (40 mg total) by mouth 2 (two) times daily. -     Hepatitis C Antibody  Drug withdrawal seizure with delirium (HCC) -     Oxcarbazepine (TRILEPTAL) 300 MG tablet; Take 1 tablet (300 mg total) by mouth at bedtime. -     pantoprazole (PROTONIX) 40 MG tablet; Take 1 tablet (40 mg total) by mouth 2 (two) times daily. -     Hepatitis C Antibody  Gastroesophageal reflux disease, unspecified whether esophagitis present -     pantoprazole (PROTONIX) 40 MG tablet; Take 1 tablet (40 mg total) by mouth 2 (two) times daily. -     Hepatitis C Antibody  Screening for lipid disorders -     Lipid Panel w/reflex Direct LDL -     pantoprazole (PROTONIX) 40 MG tablet; Take 1 tablet (40 mg total) by mouth 2 (two) times daily. -     Hepatitis C Antibody  Screening for diabetes mellitus -     COMPLETE METABOLIC PANEL WITH GFR -     pantoprazole (PROTONIX) 40 MG tablet; Take 1 tablet (40 mg total) by mouth 2 (two) times daily. -      Hepatitis C Antibody  Encounter for hepatitis C screening test for low risk patient -     pantoprazole (PROTONIX) 40 MG tablet; Take 1 tablet (40 mg total) by mouth 2 (two) times daily. -     Hepatitis C Antibody  Nausea -     ondansetron (ZOFRAN-ODT) 4 MG disintegrating tablet; Dissolve 1 tablet (4 mg total) by mouth every 4 (four) hours as needed for nausea/vomiting. -     pantoprazole (PROTONIX) 40 MG tablet; Take 1 tablet (40 mg total) by mouth 2 (two) times daily. -     Hepatitis C Antibody  Primary hypertension -     amLODipine (NORVASC) 2.5 MG tablet; Take 1 tablet (2.5 mg total) by mouth daily.   BP elevated.  Norvasc added.  Follow up BP check in 4 weeks Discussed SE  Pain contract filled out UDS ordered .Marland KitchenPDMP reviewed during this encounter. Norco UTD Follow up in 3 months      Return in about 3 months (around 12/02/2022).    Tandy Gaw, PA-C

## 2022-09-03 NOTE — Patient Instructions (Signed)
Start norvasc 2.5mg  for BP

## 2022-09-09 ENCOUNTER — Encounter: Payer: Self-pay | Admitting: Physician Assistant

## 2022-09-12 ENCOUNTER — Other Ambulatory Visit: Payer: Self-pay | Admitting: Physician Assistant

## 2022-09-12 DIAGNOSIS — K219 Gastro-esophageal reflux disease without esophagitis: Secondary | ICD-10-CM

## 2022-09-12 DIAGNOSIS — Z0000 Encounter for general adult medical examination without abnormal findings: Secondary | ICD-10-CM

## 2022-09-12 DIAGNOSIS — F900 Attention-deficit hyperactivity disorder, predominantly inattentive type: Secondary | ICD-10-CM

## 2022-09-12 DIAGNOSIS — Z1159 Encounter for screening for other viral diseases: Secondary | ICD-10-CM

## 2022-09-12 DIAGNOSIS — R110 Nausea: Secondary | ICD-10-CM

## 2022-09-12 DIAGNOSIS — Z131 Encounter for screening for diabetes mellitus: Secondary | ICD-10-CM

## 2022-09-12 DIAGNOSIS — Z13220 Encounter for screening for lipoid disorders: Secondary | ICD-10-CM

## 2022-09-12 DIAGNOSIS — F19931 Other psychoactive substance use, unspecified with withdrawal delirium: Secondary | ICD-10-CM

## 2022-09-12 DIAGNOSIS — B009 Herpesviral infection, unspecified: Secondary | ICD-10-CM

## 2022-09-12 MED ORDER — OXCARBAZEPINE 300 MG PO TABS: 300.0000 mg | ORAL_TABLET | Freq: Every day | ORAL | 3 refills | Status: DC

## 2022-09-12 MED ORDER — PANTOPRAZOLE SODIUM 40 MG PO TBEC
40.0000 mg | DELAYED_RELEASE_TABLET | Freq: Two times a day (BID) | ORAL | 3 refills | Status: DC
Start: 2022-09-12 — End: 2022-09-22

## 2022-09-12 MED ORDER — ATOMOXETINE HCL 80 MG PO CAPS
ORAL_CAPSULE | ORAL | 3 refills | Status: DC
Start: 2022-09-12 — End: 2023-05-20

## 2022-09-12 NOTE — Telephone Encounter (Signed)
Patient called requesting a refill of ; Protonnix 40mg , Strattera 80mg , Trileptal 300mg  Requesting 90 day supply  Pharmacy ;  [facility] [phone]

## 2022-09-12 NOTE — Telephone Encounter (Signed)
Contacted patient she aware to contact pharmacy and cancel rxs that where sent to [facility]

## 2022-09-12 NOTE — Telephone Encounter (Signed)
Prescriptions were sent to [facility].  But please let patient know that we had already sent the prescription for the Strattera and the Trileptal to her local pharmacy about 11 days ago so she may have to call and cancel those for [facility] to be able to process the new prescriptions just wanted to make her aware.

## 2022-09-17 NOTE — Telephone Encounter (Deleted)
Can't sign encounter

## 2022-09-22 ENCOUNTER — Other Ambulatory Visit: Payer: Self-pay

## 2022-09-22 ENCOUNTER — Telehealth: Payer: Self-pay | Admitting: Physician Assistant

## 2022-09-22 DIAGNOSIS — Z13220 Encounter for screening for lipoid disorders: Secondary | ICD-10-CM

## 2022-09-22 DIAGNOSIS — F900 Attention-deficit hyperactivity disorder, predominantly inattentive type: Secondary | ICD-10-CM

## 2022-09-22 DIAGNOSIS — Z0000 Encounter for general adult medical examination without abnormal findings: Secondary | ICD-10-CM

## 2022-09-22 DIAGNOSIS — F19931 Other psychoactive substance use, unspecified with withdrawal delirium: Secondary | ICD-10-CM

## 2022-09-22 DIAGNOSIS — R110 Nausea: Secondary | ICD-10-CM

## 2022-09-22 DIAGNOSIS — Z131 Encounter for screening for diabetes mellitus: Secondary | ICD-10-CM

## 2022-09-22 DIAGNOSIS — Z1159 Encounter for screening for other viral diseases: Secondary | ICD-10-CM

## 2022-09-22 DIAGNOSIS — B009 Herpesviral infection, unspecified: Secondary | ICD-10-CM

## 2022-09-22 DIAGNOSIS — R569 Unspecified convulsions: Secondary | ICD-10-CM

## 2022-09-22 DIAGNOSIS — K219 Gastro-esophageal reflux disease without esophagitis: Secondary | ICD-10-CM

## 2022-09-22 MED ORDER — PANTOPRAZOLE SODIUM 40 MG PO TBEC
40.0000 mg | DELAYED_RELEASE_TABLET | Freq: Two times a day (BID) | ORAL | 3 refills | Status: DC
Start: 2022-09-22 — End: 2023-03-09

## 2022-09-22 NOTE — Telephone Encounter (Signed)
Pt called.  She is requesting a refill on her Strattera and Seroquel. Pharmacy:[facility], [address]

## 2022-09-24 NOTE — Telephone Encounter (Signed)
Spoke with patient - informed her that she should have refills on both medications. She will check with pharmacy and get back to [place] if anything further needed.

## 2022-09-26 NOTE — Telephone Encounter (Signed)
Forwarding this to you I'm unable to close

## 2022-10-09 ENCOUNTER — Telehealth: Payer: Self-pay | Admitting: Physician Assistant

## 2022-10-09 DIAGNOSIS — R253 Fasciculation: Secondary | ICD-10-CM

## 2022-10-09 NOTE — Telephone Encounter (Signed)
Pt called. She is requesting a refill on Tizanidine and Norco.

## 2022-10-09 NOTE — Telephone Encounter (Signed)
Can you please close encounter it will not let me thank you

## 2022-10-10 MED ORDER — HYDROCODONE-ACETAMINOPHEN 5-325 MG PO TABS: ORAL_TABLET | ORAL | 0 refills | Status: DC

## 2022-10-10 MED ORDER — TIZANIDINE HCL 4 MG PO TABS
ORAL_TABLET | ORAL | 5 refills | Status: DC
Start: 2022-10-10 — End: 2022-11-26

## 2022-10-10 NOTE — Telephone Encounter (Signed)
..  PDMP reviewed during this encounter. No concerns Sent tizandine and norco.

## 2022-11-03 ENCOUNTER — Other Ambulatory Visit: Payer: Self-pay | Admitting: Physician Assistant

## 2022-11-03 NOTE — Progress Notes (Signed)
Called in reported BP 90/60 over past few weeks. Stop norvasc and keep checking BP at home and report in next 2 weeks.

## 2022-11-25 ENCOUNTER — Other Ambulatory Visit: Payer: Self-pay | Admitting: Physician Assistant

## 2022-11-25 DIAGNOSIS — R253 Fasciculation: Secondary | ICD-10-CM

## 2022-11-26 ENCOUNTER — Telehealth: Payer: Self-pay | Admitting: Physician Assistant

## 2022-11-26 NOTE — Telephone Encounter (Signed)
Appt scheduled for August.  ..PDMP reviewed during this encounter. Sent refills.

## 2022-11-26 NOTE — Telephone Encounter (Signed)
Patient called to get a refill on Tizanidine 4mg  and hydrocodone 5-325mg  Please submit to [facility] [phone]

## 2022-11-27 ENCOUNTER — Other Ambulatory Visit: Payer: Self-pay | Admitting: Physician Assistant

## 2022-11-28 NOTE — Telephone Encounter (Signed)
Sent!

## 2022-12-05 ENCOUNTER — Encounter: Payer: Self-pay | Admitting: Physician Assistant

## 2022-12-05 ENCOUNTER — Telehealth (INDEPENDENT_AMBULATORY_CARE_PROVIDER_SITE_OTHER): Payer: PRIVATE HEALTH INSURANCE | Admitting: Physician Assistant

## 2022-12-05 VITALS — BP 138/100 | Ht 60.0 in | Wt 94.6 lb

## 2022-12-05 DIAGNOSIS — R110 Nausea: Secondary | ICD-10-CM

## 2022-12-05 DIAGNOSIS — I10 Essential (primary) hypertension: Secondary | ICD-10-CM

## 2022-12-05 DIAGNOSIS — B009 Herpesviral infection, unspecified: Secondary | ICD-10-CM | POA: Diagnosis not present

## 2022-12-05 DIAGNOSIS — F900 Attention-deficit hyperactivity disorder, predominantly inattentive type: Secondary | ICD-10-CM

## 2022-12-05 DIAGNOSIS — F411 Generalized anxiety disorder: Secondary | ICD-10-CM | POA: Diagnosis not present

## 2022-12-05 DIAGNOSIS — G894 Chronic pain syndrome: Secondary | ICD-10-CM | POA: Diagnosis not present

## 2022-12-05 MED ORDER — HYDROCODONE-ACETAMINOPHEN 5-325 MG PO TABS
ORAL_TABLET | ORAL | 0 refills | Status: DC
Start: 2023-02-23 — End: 2023-07-10

## 2022-12-05 MED ORDER — PROPRANOLOL HCL 10 MG PO TABS
ORAL_TABLET | ORAL | 0 refills | Status: DC
Start: 2022-12-05 — End: 2023-09-01

## 2022-12-05 MED ORDER — PROMETHAZINE HCL 25 MG RE SUPP
25.0000 mg | Freq: Four times a day (QID) | RECTAL | 1 refills | Status: DC | PRN
Start: 2022-12-05 — End: 2023-08-28

## 2022-12-05 MED ORDER — CLONAZEPAM 0.5 MG PO TABS
0.5000 mg | ORAL_TABLET | Freq: Two times a day (BID) | ORAL | 0 refills | Status: DC | PRN
Start: 2022-12-05 — End: 2023-04-28

## 2022-12-05 MED ORDER — VALACYCLOVIR HCL 500 MG PO TABS: 500.0000 mg | ORAL_TABLET | Freq: Every day | ORAL | 3 refills | Status: DC

## 2022-12-05 MED ORDER — HYDROCODONE-ACETAMINOPHEN 5-325 MG PO TABS
ORAL_TABLET | ORAL | 0 refills | Status: DC
Start: 2022-12-26 — End: 2023-05-14

## 2022-12-05 MED ORDER — HYDROCODONE-ACETAMINOPHEN 5-325 MG PO TABS
ORAL_TABLET | ORAL | 0 refills | Status: DC
Start: 2023-01-24 — End: 2023-05-20

## 2022-12-05 NOTE — Progress Notes (Signed)
..Virtual Visit via Video Note  I connected with Annette Cook on 12/05/22 at 11:30 AM EDT by a video enabled telemedicine application and verified that I am speaking with the correct person using two identifiers.  Location: Patient: car Provider: clinic  .Marland KitchenParticipating in visit:  Patient: Annette Cook Provider: Tandy Gaw PA-C   I discussed the limitations of evaluation and management by telemedicine and the availability of in person appointments. The patient expressed understanding and agreed to proceed.  History of Present Illness: Pt is a 37 yo female who calls into the clinic to follow up on blood pressure. She stopped norvasc due to low BPs. Her BP only spikes when she is anxious. The bottom number will get into the 100s. No CP.   On pain contract. Averaging norco once a day. Will need refill in September.   She does not want to go on another daily medication but her anxiety is worse. She has just had a lot of life happen. She would like a few klonapin. She is concerned about the BP increase with anxiety.   She is not using belsomnra. She is sleeping with seroquel and exercise.   Needs valtrex and phentergan refilled.   .. 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 03/15/2015   Patellofemoral pain syndrome 04/12/2015   Takes daily multivitamins 09/12/2016   Opioid use agreement exists 10/24/2016   Encounter for chronic pain management 10/24/2016   Adult ADHD 04/10/2017   Overactive bladder 01/21/2018   PTSD (post-traumatic stress disorder) 11/12/2018   Anxiety 11/12/2018   Myofascial pain syndrome 11/12/2018   Drug withdrawal seizure with delirium (HCC) 02/28/2019   Elevated blood pressure reading  02/28/2019   Gastroesophageal reflux disease 02/28/2019   Moderate episode of recurrent major depressive disorder (HCC) 02/28/2019   History of cholecystectomy 03/23/2019   Black stools 07/11/2019   Leukocytes in urine 07/11/2019   Kidney stone on right side 07/11/2019   Left ovarian cyst 07/11/2019   Chronic constipation 07/11/2019   Generalized abdominal pain 02/18/2020   Pill dysphagia 02/18/2020   Non-intractable vomiting 02/18/2020   Rectal bleeding 02/18/2020   Unintentional weight loss 02/18/2020   History of ERCP 02/18/2020   Chronic right-sided low back pain without sciatica 06/12/2020   Unintended weight loss 06/12/2020   History of Helicobacter pylori infection 06/12/2020   Chronic pain syndrome 10/09/2020   Pain management contract agreement 10/09/2020   Chronic upper abdominal pain 10/10/2020   Mid back pain on right side 10/10/2020   Irritable bowel syndrome with both constipation and diarrhea 10/10/2020   Bloating 10/10/2020   Hives 01/14/2021   Delayed gastric emptying 01/14/2021   Muscle twitching 05/03/2021   Abnormal findings on imaging of biliary tract 05/05/2021   Benzodiazepine withdrawal (HCC) 01/08/2019   Elevated blood pressure reading in office without diagnosis of hypertension 02/28/2019   Gastroparesis 05/05/2021   Panic disorder without agoraphobia 10/22/2012   Recurrent major depressive disorder (HCC) 02/28/2019   Right-sided tinnitus 08/17/2019   Resolved Ambulatory Problems    Diagnosis Date Noted   Supervision of normal IUP (intrauterine pregnancy) in multigravida 11/09/2011   Rh negative status during pregnancy 02/03/2012   Marginal placenta previa 02/03/2012   Pain in throat 04/18/2013   Encounter for  supervision of other normal pregnancy in first trimester 12/07/2013   Anxiety during pregnancy in second trimester, antepartum 01/13/2014   Active labor 06/01/2014   Vaginal delivery 06/01/2014   Shoulder dystocia, delivered, current  hospitalization 06/01/2014   Bilateral hand numbness 01/16/2015   Cough 02/21/2015   Cervical radiculopathy at C8 02/27/2015   Otitis, externa, infective 03/15/2015   Anxiety and depression 09/03/2015   Ulnar nerve entrapment 09/12/2016   Polypharmacy 10/24/2016   Influenza-like illness 05/13/2017   Neck pain 11/12/2018   Chronic neck pain 02/28/2019   Upper back pain 06/07/2019   DDD (degenerative disc disease), cervical 06/07/2019   Past Medical History:  Diagnosis Date   Asthma    Depression    GERD (gastroesophageal reflux disease)    Headache    Seizure (HCC) 01/06/2019       Observations/Objective: No acute distress Normal mood and appearance Normal breathing.   . Today's Vitals   12/02/22 1138 12/05/22 1137  BP: 122/85 (!) 138/100  Weight:  94 lb 9.6 oz (42.9 kg)  Height:  5' (1.524 m)   Body mass index is 18.48 kg/m.    Assessment and Plan: Marland KitchenMarland KitchenDiagnoses and all orders for this visit:  Primary hypertension -     propranolol (INDERAL) 10 MG tablet; Take one to two tablets as needed for anxiety and blood pressure elevation.  HSV infection -     valACYclovir (VALTREX) 500 MG tablet; Take 1 tablet (500 mg total) by mouth daily.  Generalized anxiety disorder -     clonazePAM (KLONOPIN) 0.5 MG tablet; Take 1 tablet (0.5 mg total) by mouth 2 (two) times daily as needed for anxiety. -     propranolol (INDERAL) 10 MG tablet; Take one to two tablets as needed for anxiety and blood pressure elevation.  Chronic pain syndrome -     HYDROcodone-acetaminophen (NORCO/VICODIN) 5-325 MG tablet; Take 1 tablet by mouth daily as needed for moderate pain -     HYDROcodone-acetaminophen (NORCO/VICODIN) 5-325 MG tablet; Take 1 tablet by mouth daily as needed for moderate pain -     HYDROcodone-acetaminophen (NORCO/VICODIN) 5-325 MG tablet; Take 1 tablet by mouth daily as needed for moderate pain  Attention deficit hyperactivity disorder (ADHD), predominantly inattentive  type  Nausea -     promethazine (PHENERGAN) 25 MG suppository; Place 1 suppository (25 mg total) rectally every 6 (six) hours as needed for nausea or vomiting.   Use propranolol as needed with anxiety and BP spike 1-2 tablets Follow up in one month, keep regular checks on BP  .Marland KitchenPDMP reviewed during this encounter. No concerns Norco sent for next 3 months Pain contract UTD Appt in 3 months  Klonapin #30 and DO NOT TAKE with NORCO Pt aware Hydroxyzine for anxiety when BP not spiked  Valtrex for HSV suppression  Strattera continue for ADHD   Follow Up Instructions:    I discussed the assessment and treatment plan with the patient. The patient was provided an opportunity to ask questions and all were answered. The patient agreed with the plan and demonstrated an understanding of the instructions.   The patient was advised to call back or seek an in-person evaluation if the symptoms worsen or if the condition fails to improve as anticipated.    Tandy Gaw, PA-C

## 2022-12-05 NOTE — Progress Notes (Signed)
More phenegren and 90 day rx refills

## 2023-01-23 ENCOUNTER — Other Ambulatory Visit: Payer: Self-pay | Admitting: Physician Assistant

## 2023-01-23 DIAGNOSIS — R253 Fasciculation: Secondary | ICD-10-CM

## 2023-02-27 ENCOUNTER — Telehealth: Payer: Self-pay | Admitting: Physician Assistant

## 2023-02-27 MED ORDER — NICOTINE 21 MG/24HR TD PT24: 21.0000 mg | MEDICATED_PATCH | Freq: Every day | TRANSDERMAL | 1 refills | Status: DC

## 2023-02-27 NOTE — Telephone Encounter (Signed)
Pt called this morning requesting that script for nicotine patch be sent to [facility].

## 2023-02-27 NOTE — Telephone Encounter (Signed)
Pt is aware. Annette Cook  [name]

## 2023-03-09 ENCOUNTER — Telehealth: Payer: Self-pay

## 2023-03-09 ENCOUNTER — Other Ambulatory Visit: Payer: Self-pay | Admitting: Physician Assistant

## 2023-03-09 DIAGNOSIS — Z131 Encounter for screening for diabetes mellitus: Secondary | ICD-10-CM

## 2023-03-09 DIAGNOSIS — F900 Attention-deficit hyperactivity disorder, predominantly inattentive type: Secondary | ICD-10-CM

## 2023-03-09 DIAGNOSIS — R569 Unspecified convulsions: Secondary | ICD-10-CM

## 2023-03-09 DIAGNOSIS — R253 Fasciculation: Secondary | ICD-10-CM

## 2023-03-09 DIAGNOSIS — Z13220 Encounter for screening for lipoid disorders: Secondary | ICD-10-CM

## 2023-03-09 DIAGNOSIS — B009 Herpesviral infection, unspecified: Secondary | ICD-10-CM

## 2023-03-09 DIAGNOSIS — R110 Nausea: Secondary | ICD-10-CM

## 2023-03-09 DIAGNOSIS — Z1159 Encounter for screening for other viral diseases: Secondary | ICD-10-CM

## 2023-03-09 DIAGNOSIS — Z0000 Encounter for general adult medical examination without abnormal findings: Secondary | ICD-10-CM

## 2023-03-09 DIAGNOSIS — K219 Gastro-esophageal reflux disease without esophagitis: Secondary | ICD-10-CM

## 2023-03-09 MED ORDER — PANTOPRAZOLE SODIUM 40 MG PO TBEC: 40.0000 mg | DELAYED_RELEASE_TABLET | Freq: Two times a day (BID) | ORAL | 1 refills | Status: DC

## 2023-03-09 NOTE — Telephone Encounter (Signed)
Copied from CRM [id number]. Topic: Clinical - Prescription Issue >> Mar 09, 2023  9:42 AM Fonda Kinder J wrote: Reason for CRM: Patient states generic brand of pantoprazole is not working and she is taking them twice a day, patient wants to know if provider can write a prescription for regular brand

## 2023-03-11 ENCOUNTER — Telehealth: Payer: Self-pay

## 2023-03-11 DIAGNOSIS — R253 Fasciculation: Secondary | ICD-10-CM

## 2023-03-11 MED ORDER — TIZANIDINE HCL 4 MG PO TABS
ORAL_TABLET | ORAL | 0 refills | Status: DC
Start: 2023-03-11 — End: 2023-05-14

## 2023-03-11 NOTE — Telephone Encounter (Signed)
Copied from CRM [id number]. Topic: Clinical - Prescription Issue >> Mar 11, 2023 11:07 AM Hector Shade B wrote: Reason for CRM: Patient stated she had called in on the 25th in regards to the refill I asked patient had she called the pharmacy she stated yes and advised it takes up to three days she stated she was concerned because she knows the pharmacy would be closed on tomorrow.  She requested if the pharmacy is closed before order is submitted then to submit to the [facility] in [place].  tiZANidine (ZANAFLEX) 4 MG tablet

## 2023-04-28 ENCOUNTER — Telehealth (INDEPENDENT_AMBULATORY_CARE_PROVIDER_SITE_OTHER): Payer: PRIVATE HEALTH INSURANCE | Admitting: Physician Assistant

## 2023-04-28 ENCOUNTER — Encounter: Payer: Self-pay | Admitting: Physician Assistant

## 2023-04-28 DIAGNOSIS — F900 Attention-deficit hyperactivity disorder, predominantly inattentive type: Secondary | ICD-10-CM

## 2023-04-28 DIAGNOSIS — F411 Generalized anxiety disorder: Secondary | ICD-10-CM

## 2023-04-28 MED ORDER — CLONAZEPAM 0.5 MG PO TABS
0.5000 mg | ORAL_TABLET | Freq: Two times a day (BID) | ORAL | 1 refills | Status: AC | PRN
Start: 2023-04-28 — End: ?

## 2023-04-28 MED ORDER — ATOMOXETINE HCL 60 MG PO CAPS
60.0000 mg | ORAL_CAPSULE | Freq: Every day | ORAL | 0 refills | Status: DC
Start: 2023-04-28 — End: 2023-05-14

## 2023-04-28 NOTE — Progress Notes (Signed)
 ..Virtual Visit via Video Note  I connected with [name] Files on 04/28/23 at 10:10 AM EST by a video enabled telemedicine application and verified that I am speaking with the correct person using two identifiers.  Location: Patient: home Provider: clinic  .SABRAParticipating in visit:  Patient: Annette Cook Provider: Vermell Bologna PA-C Provider in training: Vernell Gate PA-S   I discussed the limitations of evaluation and management by telemedicine and the availability of in person appointments. The patient expressed understanding and agreed to proceed.  History of Present Illness: Pt is a 38 yo who calls into the clinic for refill of klonapin. Her 44 yo son got his girlfriend pregnant. She is completely overwhelmed.   She is trying to get off a lot of her medications. She would like to decrease strattera .   .. 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 03/15/2015   Patellofemoral pain syndrome 04/12/2015   Takes daily multivitamins 09/12/2016   Opioid use agreement exists 10/24/2016   Encounter for chronic pain management 10/24/2016   Adult ADHD 04/10/2017   Overactive bladder 01/21/2018   PTSD (post-traumatic stress disorder) 11/12/2018   Anxiety 11/12/2018   Myofascial pain syndrome 11/12/2018   Drug withdrawal seizure with delirium (HCC) 02/28/2019   Elevated blood pressure reading 02/28/2019   Gastroesophageal reflux disease 02/28/2019   Moderate episode of recurrent major depressive disorder (HCC) 02/28/2019   History of cholecystectomy 03/23/2019   Black stools 07/11/2019   Leukocytes in urine 07/11/2019   Kidney stone on right side 07/11/2019   Left ovarian cyst 07/11/2019   Chronic constipation  07/11/2019   Generalized abdominal pain 02/18/2020   Pill dysphagia 02/18/2020   Non-intractable vomiting 02/18/2020   Rectal bleeding 02/18/2020   Unintentional weight loss 02/18/2020   History of ERCP 02/18/2020   Chronic right-sided low back pain without sciatica 06/12/2020   Unintended weight loss 06/12/2020   History of Helicobacter pylori infection 06/12/2020   Chronic pain syndrome 10/09/2020   Pain management contract agreement 10/09/2020   Chronic upper abdominal pain 10/10/2020   Mid back pain on right side 10/10/2020   Irritable bowel syndrome with both constipation and diarrhea 10/10/2020   Bloating 10/10/2020   Hives 01/14/2021   Delayed gastric emptying 01/14/2021   Muscle twitching 05/03/2021   Abnormal findings on imaging of biliary tract 05/05/2021   Benzodiazepine withdrawal (HCC) 01/08/2019   Elevated blood pressure reading in office without diagnosis of hypertension 02/28/2019   Gastroparesis 05/05/2021   Panic disorder without agoraphobia 10/22/2012   Recurrent major depressive disorder (HCC) 02/28/2019   Right-sided tinnitus 08/17/2019   Resolved Ambulatory Problems    Diagnosis Date Noted   Supervision of normal IUP (intrauterine pregnancy) in multigravida 11/09/2011   Rh negative status during pregnancy 02/03/2012   Marginal placenta previa 02/03/2012   Pain in throat 04/18/2013   Encounter for supervision of other normal pregnancy in first trimester 12/07/2013   Anxiety during pregnancy in second trimester, antepartum 01/13/2014   Active labor 06/01/2014   Vaginal delivery 06/01/2014   Shoulder dystocia, delivered, current hospitalization 06/01/2014   Bilateral hand numbness 01/16/2015   Cough 02/21/2015   Cervical radiculopathy at C8 02/27/2015   Otitis, externa, infective 03/15/2015   Anxiety and depression 09/03/2015  Ulnar nerve entrapment 09/12/2016   Polypharmacy 10/24/2016   Influenza-like illness 05/13/2017   Neck pain 11/12/2018    Chronic neck pain 02/28/2019   Upper back pain 06/07/2019   DDD (degenerative disc disease), cervical 06/07/2019   Past Medical History:  Diagnosis Date   Asthma    Depression    GERD (gastroesophageal reflux disease)    Headache    Seizure (HCC) 01/06/2019       Observations/Objective: No acute distress Normal mood and appearance   Assessment and Plan: SABRASABRADiagnoses and all orders for this visit:  Generalized anxiety disorder -     clonazePAM  (KLONOPIN ) 0.5 MG tablet; Take 1 tablet (0.5 mg total) by mouth 2 (two) times daily as needed for anxiety.  Attention deficit hyperactivity disorder (ADHD), predominantly inattentive type -     atomoxetine  (STRATTERA ) 60 MG capsule; Take 1 capsule (60 mg total) by mouth daily.   ..PDMP reviewed during this encounter. No concerns Refilled klonapin for prn usage Encouraged self care and counseling Follow up as needed  Decreased strattera  to 60mg  follow up in 3 months    Follow Up Instructions:    I discussed the assessment and treatment plan with the patient. The patient was provided an opportunity to ask questions and all were answered. The patient agreed with the plan and demonstrated an understanding of the instructions.   The patient was advised to call back or seek an in-person evaluation if the symptoms worsen or if the condition fails to improve as anticipated.   Annette Stang, PA-C

## 2023-05-14 ENCOUNTER — Other Ambulatory Visit: Payer: Self-pay | Admitting: Physician Assistant

## 2023-05-14 DIAGNOSIS — R253 Fasciculation: Secondary | ICD-10-CM

## 2023-05-14 DIAGNOSIS — F900 Attention-deficit hyperactivity disorder, predominantly inattentive type: Secondary | ICD-10-CM

## 2023-05-14 DIAGNOSIS — G894 Chronic pain syndrome: Secondary | ICD-10-CM

## 2023-05-14 NOTE — Telephone Encounter (Signed)
Copied from CRM [id number]. Topic: Clinical - Medication Refill >> May 14, 2023  2:18 PM Dennison Nancy wrote: Most Recent Primary Care Visit:  Provider: Jomarie Longs  Department: [facility] CARE [facility]  Visit Type: MYCHART VIDEO VISIT  Date: 04/28/2023  Medication: tiZANidine (ZANAFLEX) 4 MG tablet,atomoxetine (STRATTERA) 60 MG capsule,HYDROcodone-acetaminophen (NORCO/VICODIN) 5-325 MG tablet  Has the patient contacted their pharmacy? No , reason control substance  (Agent: If no, request that the patient contact the pharmacy for the refill. If patient does not wish to contact the pharmacy document the reason why and proceed with request.) (Agent: If yes, when and what did the pharmacy advise?)  Is this the correct pharmacy for this prescription? Yes If no, delete pharmacy and type the correct one.  This is the patient's preferred pharmacy:   [facility] - [place] - [address] [place] [address] Phone: [phone] Fax: [phone]   Has the prescription been filled recently? No  Is the patient out of the medication? No  , will be out tomorrow   Has the patient been seen for an appointment in the last year OR does the patient have an upcoming appointment? Yes  Can we respond through MyChart? No  Agent: Please be advised that Rx refills may take up to 3 business days. We ask that you follow-up with your pharmacy.

## 2023-05-15 MED ORDER — ATOMOXETINE HCL 60 MG PO CAPS
60.0000 mg | ORAL_CAPSULE | Freq: Every day | ORAL | 0 refills | Status: DC
Start: 2023-05-15 — End: 2023-07-10

## 2023-05-15 MED ORDER — HYDROCODONE-ACETAMINOPHEN 5-325 MG PO TABS
ORAL_TABLET | ORAL | 0 refills | Status: DC
Start: 2023-05-15 — End: 2023-09-01

## 2023-05-15 MED ORDER — TIZANIDINE HCL 4 MG PO TABS: ORAL_TABLET | ORAL | 0 refills | Status: DC

## 2023-05-20 ENCOUNTER — Telehealth: Payer: Self-pay

## 2023-05-20 ENCOUNTER — Other Ambulatory Visit: Payer: Self-pay | Admitting: Physician Assistant

## 2023-05-20 ENCOUNTER — Other Ambulatory Visit: Payer: Self-pay

## 2023-05-20 DIAGNOSIS — F900 Attention-deficit hyperactivity disorder, predominantly inattentive type: Secondary | ICD-10-CM

## 2023-05-20 DIAGNOSIS — G894 Chronic pain syndrome: Secondary | ICD-10-CM

## 2023-05-20 DIAGNOSIS — R253 Fasciculation: Secondary | ICD-10-CM

## 2023-05-20 MED ORDER — PANTOPRAZOLE SODIUM 40 MG PO TBEC: 40.0000 mg | DELAYED_RELEASE_TABLET | Freq: Two times a day (BID) | ORAL | 1 refills | Status: DC

## 2023-05-20 MED ORDER — TIZANIDINE HCL 4 MG PO TABS: ORAL_TABLET | ORAL | 0 refills | Status: DC

## 2023-05-20 MED ORDER — HYDROCODONE-ACETAMINOPHEN 5-325 MG PO TABS: ORAL_TABLET | ORAL | 0 refills | Status: DC

## 2023-05-20 NOTE — Telephone Encounter (Signed)
 Copied from CRM [id number]. Topic: Clinical - Prescription Issue >> May 18, 2023 10:39 AM Joesph PARAS wrote: Reason for CRM: Patient calling to state that Zanaflex , Norco, and Strattera  need to all go to the [facility]. Patient is out of medication at this point.   [facility] - [place] - [address] [place] [address] Phone: [phone] Fax: [phone]

## 2023-05-20 NOTE — Telephone Encounter (Signed)
 Copied from CRM [id number]. Topic: Clinical - Prescription Issue >> May 19, 2023 10:32 AM Ivette P wrote: Reason for CRM: Pt is calling in about medication being sent ot the wrong pharmacy. Pt would like following medication sent to new pharmacy.     Medication: atomoxetine  (STRATTERA ) 60 MG capsule  tiZANidine  (ZANAFLEX ) 4 MG tablet  HYDROcodone -acetaminophen  (NORCO/VICODIN) 5-325 MG tablet     Pharmacy:  [facility] - [place], [place] - [address] [place] [address]

## 2023-05-20 NOTE — Telephone Encounter (Signed)
Requesting to change pharmacy for  Zanaflex last written1/31/25 Norco last written 05/15/2023 Strattera last written 05/15/2023  Preferred pharmacy [facility] has been attached

## 2023-05-20 NOTE — Telephone Encounter (Signed)
 Message sent to provider in separate request.

## 2023-06-29 ENCOUNTER — Other Ambulatory Visit: Payer: Self-pay | Admitting: Physician Assistant

## 2023-06-29 NOTE — Telephone Encounter (Signed)
 Copied from CRM [id number]. Topic: Clinical - Medication Refill >> Jun 29, 2023 10:19 AM Nila Nephew wrote: Most Recent Primary Care Visit:  Provider: Jomarie Longs  Department: [facility] CARE [facility]  Visit Type: MYCHART VIDEO VISIT  Date: 04/28/2023  Medication: Seroquel - 50mg  - patient requesting 3 month supply  Has the patient contacted their pharmacy? Yes - states no refills - sent to psych when they should have sent it to PCP per patient  Is this the correct pharmacy for this prescription? Yes  This is the patient's preferred pharmacy:  [facility] - [place], [place] - [address] [place] [address] Phone: [phone] Fax: [phone]   Has the prescription been filled recently? No  Is the patient out of the medication? Yes  Has the patient been seen for an appointment in the last year OR does the patient have an upcoming appointment? Yes  Can we respond through MyChart? No - Phone Call  Agent: Please be advised that Rx refills may take up to 3 business days. We ask that you follow-up with your pharmacy.

## 2023-07-01 NOTE — Telephone Encounter (Signed)
 Historical Provider  Last Video visit 04/28/2023

## 2023-07-03 MED ORDER — QUETIAPINE FUMARATE 50 MG PO TABS: 50.0000 mg | ORAL_TABLET | Freq: Every day | ORAL | 1 refills | Status: DC

## 2023-07-08 ENCOUNTER — Ambulatory Visit: Payer: PRIVATE HEALTH INSURANCE | Admitting: Physician Assistant

## 2023-07-10 ENCOUNTER — Other Ambulatory Visit: Payer: Self-pay | Admitting: Physician Assistant

## 2023-07-10 DIAGNOSIS — F900 Attention-deficit hyperactivity disorder, predominantly inattentive type: Secondary | ICD-10-CM

## 2023-07-10 DIAGNOSIS — R253 Fasciculation: Secondary | ICD-10-CM

## 2023-07-10 DIAGNOSIS — G894 Chronic pain syndrome: Secondary | ICD-10-CM

## 2023-07-10 MED ORDER — ATOMOXETINE HCL 60 MG PO CAPS: 60.0000 mg | ORAL_CAPSULE | Freq: Every day | ORAL | 0 refills | Status: DC

## 2023-07-10 MED ORDER — HYDROCODONE-ACETAMINOPHEN 5-325 MG PO TABS: ORAL_TABLET | ORAL | 0 refills | Status: DC

## 2023-07-10 MED ORDER — TIZANIDINE HCL 4 MG PO TABS
ORAL_TABLET | ORAL | 0 refills | Status: DC
Start: 2023-07-10 — End: 2023-08-26

## 2023-07-10 NOTE — Telephone Encounter (Signed)
 Requesting rx rf of  hydrocodone - acet 5-325 Last written 05/20/2023 Strattera 60mg  Last written 05/15/2023 Tizanidine 4mg   Last written 05/20/2023 Last OV 04/28/2023 video visit. Upcoming appt = none

## 2023-07-10 NOTE — Telephone Encounter (Signed)
 Copied from CRM [id number]. Topic: Clinical - Medication Refill >> Jul 10, 2023 10:17 AM Izetta Dakin wrote: Most Recent Primary Care Visit:  Provider: Jomarie Longs  Department: [facility] CARE [facility]  Visit Type: MYCHART VIDEO VISIT  Date: 04/28/2023  Medication: HYDROcodone-acetaminophen (NORCO/VICODIN) 5-325 MG tablet  tiZANidine (ZANAFLEX) 4 MG tablet (3 MONTH SUPPLY) atomoxetine (STRATTERA) 60 MG capsule (3 MONTH SUPPLY)  Has the patient contacted their pharmacy? Yes (Agent: If no, request that the patient contact the pharmacy for the refill. If patient does not wish to contact the pharmacy document the reason why and proceed with request.) (Agent: If yes, when and what did the pharmacy advise?)  Is this the correct pharmacy for this prescription? Yes If no, delete pharmacy and type the correct one.  This is the patient's preferred pharmacy:  [facility] - [place] - [address] [place] [address] Phone: [phone] Fax: [phone]   Has the prescription been filled recently? No  Is the patient out of the medication? Yes  Has the patient been seen for an appointment in the last year OR does the patient have an upcoming appointment? Yes  Can we respond through MyChart? No  Agent: Please be advised that Rx refills may take up to 3 business days. We ask that you follow-up with your pharmacy.

## 2023-07-13 ENCOUNTER — Telehealth: Payer: Self-pay | Admitting: Physician Assistant

## 2023-07-13 NOTE — Telephone Encounter (Signed)
 Copied from CRM [id number]. Topic: Clinical - Prescription Issue >> Jul 13, 2023 12:27 PM Philippa Chester F wrote: Reason for CRM:   Patient stated she had been waiting on the result of a PA for  pantoprazole (PROTONIX) 40 MG tablet . Patient has not heard anything back and at this point would like the clinical staff to call her back in reference to the PA. Patient has began to experience adverse side effects of not taking the medication such as vomiting and has lost 5 lbs.  Patient denied scheduling an appt and speaking with a Triage Nurse.    Callback Number: [phone]

## 2023-07-13 NOTE — Telephone Encounter (Signed)
 Copied from CRM [id number]. Topic: Clinical - Prescription Issue >> Jul 13, 2023 12:21 PM Philippa Chester F wrote: Reason for CRM:    Patient called in stating that she wanted to inform the office that a prior authorization is needed for her HYDROcodone-acetaminophen (NORCO/VICODIN) 5-325 MG tablet prescription to be filled.    If any additional information is needed please contact the patient at [phone]

## 2023-07-14 ENCOUNTER — Other Ambulatory Visit (HOSPITAL_COMMUNITY): Payer: Self-pay

## 2023-07-14 ENCOUNTER — Telehealth: Payer: Self-pay | Admitting: Pharmacy Technician

## 2023-07-14 NOTE — Telephone Encounter (Signed)
 PA request has been Started. New Encounter has been or will be created for follow up. For additional info see Pharmacy Prior Auth telephone encounter from 07/14/2023.

## 2023-07-14 NOTE — Telephone Encounter (Signed)
 Pharmacy Patient Advocate Encounter   Received notification from Pt Calls Messages that prior authorization for BRAND NAME Protonix 40MG  dr tablets is required/requested.   Insurance verification completed.   The patient is insured through [facility] .   Per test claim: PA required; PA submitted to above mentioned insurance via CoverMyMeds Key/confirmation #/EOC [id number] Status is pending

## 2023-07-14 NOTE — Telephone Encounter (Signed)
 Pharmacy Patient Advocate Encounter  Received notification from [facility] Medicaid that Prior Authorization for HYDROcodone-Acetaminophen 5-325MG  tablets has been APPROVED from 07/14/2023 to 01/10/2024   PA #/Case ID/Reference #: [id number]

## 2023-07-14 NOTE — Telephone Encounter (Signed)
 Pharmacy Patient Advocate Encounter   Received notification from Pt Calls Messages that prior authorization for HYDROcodone-Acetaminophen 5-325MG  tablets is required/requested.   Insurance verification completed.   The patient is insured through [facility] [place] .   Per test claim: PA required; PA submitted to above mentioned insurance via CoverMyMeds Key/confirmation #/EOC [id number] Status is pending

## 2023-07-17 NOTE — Telephone Encounter (Signed)
 Checked to see if denial letter had been posted yet and it is not in pt's chart yet for review. Will await this so we can then see why Rx was denied.

## 2023-07-17 NOTE — Telephone Encounter (Signed)
 Pharmacy Patient Advocate Encounter  Received notification from [facility] that Prior Authorization for Protonix 40 has been DENIED.  Full denial letter will be uploaded to the media tab. See denial reason below.   PA #/Case ID/Reference #: [id number]

## 2023-07-22 ENCOUNTER — Other Ambulatory Visit: Payer: Self-pay

## 2023-07-22 DIAGNOSIS — K219 Gastro-esophageal reflux disease without esophagitis: Secondary | ICD-10-CM

## 2023-07-22 MED ORDER — PANTOPRAZOLE SODIUM 40 MG PO TBEC: 40.0000 mg | DELAYED_RELEASE_TABLET | Freq: Two times a day (BID) | ORAL | 1 refills | Status: DC

## 2023-07-22 NOTE — Telephone Encounter (Signed)
 Discontinued [place] [facility]

## 2023-08-20 ENCOUNTER — Other Ambulatory Visit: Payer: Self-pay | Admitting: Physician Assistant

## 2023-08-20 DIAGNOSIS — F411 Generalized anxiety disorder: Secondary | ICD-10-CM

## 2023-08-20 DIAGNOSIS — K219 Gastro-esophageal reflux disease without esophagitis: Secondary | ICD-10-CM

## 2023-08-20 DIAGNOSIS — G894 Chronic pain syndrome: Secondary | ICD-10-CM

## 2023-08-20 MED ORDER — HYDROXYZINE HCL 50 MG PO TABS: ORAL_TABLET | ORAL | 1 refills | Status: DC

## 2023-08-20 NOTE — Telephone Encounter (Unsigned)
 Copied from CRM [id number]. Topic: Clinical - Medication Refill >> Aug 20, 2023 12:53 PM Brynn Caras wrote: Medication: pantoprazole  (PROTONIX ) 40 MG tablet (*twice daily) hydrOXYzine  (ATARAX ) 50 MG tablet HYDROcodone -acetaminophen  (NORCO/VICODIN) 5-325 MG tablet   Has the patient contacted their pharmacy? Yes (Agent: If no, request that the patient contact the pharmacy for the refill. If patient does not wish to contact the pharmacy document the reason why and proceed with request.) (Agent: If yes, when and what did the pharmacy advise?)  This is the patient's preferred pharmacy:   [facility] - [place] - [address] [place] [address] Phone: [phone] Fax: [phone]  Is this the correct pharmacy for this prescription? Yes If no, delete pharmacy and type the correct one.   Has the prescription been filled recently? No  Is the patient out of the medication? Yes  Has the patient been seen for an appointment in the last year OR does the patient have an upcoming appointment? No  Can we respond through MyChart? Yes  Agent: Please be advised that Rx refills may take up to 3 business days. We ask that you follow-up with your pharmacy.

## 2023-08-21 MED ORDER — HYDROCODONE-ACETAMINOPHEN 5-325 MG PO TABS: ORAL_TABLET | ORAL | 0 refills | Status: DC

## 2023-08-24 ENCOUNTER — Other Ambulatory Visit: Payer: Self-pay | Admitting: Physician Assistant

## 2023-08-24 ENCOUNTER — Telehealth: Payer: Self-pay | Admitting: Physician Assistant

## 2023-08-24 DIAGNOSIS — K219 Gastro-esophageal reflux disease without esophagitis: Secondary | ICD-10-CM

## 2023-08-24 DIAGNOSIS — R110 Nausea: Secondary | ICD-10-CM

## 2023-08-24 NOTE — Telephone Encounter (Signed)
 Copied from CRM [id number]. Topic: Clinical - Medication Refill >> Aug 24, 2023  5:58 PM Kevelyn M wrote: Medication: pantoprazole  (PROTONIX ) 40 MG tablet  Has the patient contacted their pharmacy? Yes (Agent: If no, request that the patient contact the pharmacy for the refill. If patient does not wish to contact the pharmacy document the reason why and proceed with request.) (Agent: If yes, when and what did the pharmacy advise?)  This is the patient's preferred pharmacy:  [facility] - [place] - [address] [place] [address] Phone: [phone] Fax: [phone]  Is this the correct pharmacy for this prescription? Yes If no, delete pharmacy and type the correct one.   Has the prescription been filled recently? No  Is the patient out of the medication? No  Has the patient been seen for an appointment in the last year OR does the patient have an upcoming appointment? Yes  Can we respond through MyChart?No  Agent: Please be advised that Rx refills may take up to 3 business days. We ask that you follow-up with your pharmacy.

## 2023-08-25 ENCOUNTER — Other Ambulatory Visit: Payer: Self-pay | Admitting: Physician Assistant

## 2023-08-25 ENCOUNTER — Ambulatory Visit: Payer: Self-pay

## 2023-08-25 DIAGNOSIS — R253 Fasciculation: Secondary | ICD-10-CM

## 2023-08-25 NOTE — Telephone Encounter (Signed)
 RN spoke to pt and advised that a 90 day supply of protonix  was sent to [place] [facility] on 07/22/23, also that there is 1 refill with that rx. Pt understands.

## 2023-08-25 NOTE — Telephone Encounter (Signed)
 Info only: Rhonda, Pharmacist, called to have Tizanidine  refilled after [facility] provider cancelled. Burdette Carolin, CAL nurse, is sending [facility] a message. Pharmacist sent electronic refill request and  can be reached at [phone].       Copied from CRM [id number]. Topic: General - Other >> Aug 25, 2023  1:13 PM Kevelyn M wrote: Reason for CRM: Sallyanne Creamer with [facility] calling in to get tiZANidine  (ZANAFLEX ) 4 MG tablet [[id number]] expedited due patient's prior provider deleted the prescription. Patient is very upset because she thought she had and active prescription. Reason for Disposition  Health Information question, no triage required and triager able to answer question  Answer Assessment - Initial Assessment Questions 1. REASON FOR CALL or QUESTION: "What is your reason for calling today?" or "How can I best help you?" or "What question do you have that I can help answer?"     Pharmacist Rhonda, called to have Tizanidine  refilled by verbal order.  Protocols used: Information Only Call - No Triage-A-AH

## 2023-08-26 ENCOUNTER — Telehealth: Payer: Self-pay

## 2023-08-26 DIAGNOSIS — R253 Fasciculation: Secondary | ICD-10-CM

## 2023-08-26 MED ORDER — TIZANIDINE HCL 4 MG PO TABS
ORAL_TABLET | ORAL | 1 refills | Status: DC
Start: 2023-08-26 — End: 2023-08-26

## 2023-08-26 MED ORDER — TIZANIDINE HCL 4 MG PO TABS
ORAL_TABLET | ORAL | 1 refills | Status: DC
Start: 2023-08-26 — End: 2023-12-08

## 2023-08-26 NOTE — Telephone Encounter (Signed)
 Copied from CRM [id number]. Topic: Clinical - Prescription Issue >> Aug 26, 2023 12:14 PM Tiffany H wrote: Reason for CRM: Darnell with [facility] called to advise that tiZANidine  (ZANAFLEX ) 4 MG tablet [[id number]] prescription needs to be resent. However, advised Darnell that prescription had been printed today. Please follow up with patient and Darrell at [facility] pharmacy to clarify how patient will be submitting prescription - if she'll walk it in or if she will consent to e-scribe. Please assist.

## 2023-08-26 NOTE — Telephone Encounter (Signed)
I resent electronically

## 2023-08-26 NOTE — Telephone Encounter (Signed)
 This prescription is showing print - was this printed and faxed or will it need to be resent via e- script ?

## 2023-08-26 NOTE — Telephone Encounter (Signed)
 Sent to pharmacy

## 2023-09-01 ENCOUNTER — Telehealth: Payer: PRIVATE HEALTH INSURANCE | Admitting: Physician Assistant

## 2023-09-01 ENCOUNTER — Encounter: Payer: Self-pay | Admitting: Physician Assistant

## 2023-09-01 DIAGNOSIS — G894 Chronic pain syndrome: Secondary | ICD-10-CM | POA: Diagnosis not present

## 2023-09-01 DIAGNOSIS — R1084 Generalized abdominal pain: Secondary | ICD-10-CM

## 2023-09-01 DIAGNOSIS — F900 Attention-deficit hyperactivity disorder, predominantly inattentive type: Secondary | ICD-10-CM | POA: Diagnosis not present

## 2023-09-01 MED ORDER — ATOMOXETINE HCL 60 MG PO CAPS: 60.0000 mg | ORAL_CAPSULE | Freq: Every day | ORAL | 4 refills | Status: DC

## 2023-09-01 MED ORDER — HYDROCODONE-ACETAMINOPHEN 5-325 MG PO TABS: ORAL_TABLET | ORAL | 0 refills | Status: DC

## 2023-09-04 ENCOUNTER — Encounter: Payer: Self-pay | Admitting: Physician Assistant

## 2023-09-04 NOTE — Progress Notes (Signed)
..  Virtual Visit via Video Note  I connected with Annette Cook on 09/01/2023  at  3:00 PM EDT by a video enabled telemedicine application and verified that I am speaking with the correct person using two identifiers.  Location: Patient: home Provider: clinic  .Annette AasParticipating in visit:  Patient: Annette Cook Provider: Sandy Crumb PA-C   I discussed the limitations of evaluation and management by telemedicine and the availability of in person appointments. The patient expressed understanding and agreed to proceed.  History of Present Illness: Pt is a 38 yo female who calls into the clinic for medication refills. She takes about 30 norco a month for pain. She is on strattera  for ADHD. She is doing well with no concerns.     Observations/Objective: No acute distress Normal mood and appearance   Assessment and Plan: Annette AasAaron AasDiagnoses and all orders for this visit:  Chronic pain syndrome -     HYDROcodone -acetaminophen  (NORCO/VICODIN) 5-325 MG tablet; Take 1 tablet by mouth daily as needed for moderate pain -     HYDROcodone -acetaminophen  (NORCO/VICODIN) 5-325 MG tablet; Take 1 tablet by mouth daily as needed for moderate pain -     HYDROcodone -acetaminophen  (NORCO/VICODIN) 5-325 MG tablet; Take 1 tablet by mouth daily as needed for moderate pain  Attention deficit hyperactivity disorder (ADHD), predominantly inattentive type -     atomoxetine  (STRATTERA ) 60 MG capsule; Take 1 capsule (60 mg total) by mouth daily.   ..PDMP reviewed during this encounter. Chronic abdominal pain No concerns Post dated Rxs Pain contract is NOT up to date Pt MUST do an in person visit for next refill in 3 months with UDS Follow up in 3 months  Strattera  refilled   Follow Up Instructions:    I discussed the assessment and treatment plan with the patient. The patient was provided an opportunity to ask questions and all were answered. The patient agreed with the plan and demonstrated an understanding of the  instructions.   The patient was advised to call back or seek an in-person evaluation if the symptoms worsen or if the condition fails to improve as anticipated.   Rey Fors, PA-C

## 2023-10-27 ENCOUNTER — Ambulatory Visit: Payer: Self-pay

## 2023-10-27 MED ORDER — MUPIROCIN 2 % EX OINT: TOPICAL_OINTMENT | CUTANEOUS | 0 refills | Status: DC

## 2023-10-27 NOTE — Addendum Note (Signed)
 Addended by: ANTONIETTE VERMELL CROME on: 10/27/2023 02:02 PM   Modules accepted: Orders

## 2023-10-27 NOTE — Telephone Encounter (Signed)
 FYI Only or Action Required?: Action required by provider: medication request.  Patient was last seen in primary care on 09/01/2023 by Antoniette Vermell CROME, PA-C.  Called Nurse Triage reporting Sore.  Symptoms began a week ago.  Symptoms are: gradually improving.  Triage Disposition: Home Care  Patient/caregiver understands and will follow disposition?: Yes                          Reason for Disposition  1 or 2 small sores  Answer Assessment - Initial Assessment Questions Pt is requesting Bactroban  sent to the following pharmacy:  [id number] [place] [place] - [address]  [address], [place] [address]   Pt is unable to come in for an appointment as she states they are short-staffed at work.         1. APPEARANCE of SORES: What do the sores look like?     Can't see it 2. NUMBER: How many sores are there?     One 3. SIZE: How big is the largest sore?     Swollen 4. LOCATION: Where are the sores located?     Right nostril 5. ONSET: When did the sores begin?     Last week 6. TENDER: Does it hurt when you touch it?  (Scale 1-10; or mild, moderate, severe)      Bad yesterday but is getting better 8. OTHER SYMPTOMS: Do you have any other symptoms? (e.g., fever, new weakness)     Swollen and affected breathing  Pt is requesting Bactroban  for this  Protocols used: Sores-A-AH

## 2023-10-27 NOTE — Telephone Encounter (Signed)
 This RN made first attempt to triage patient. No answer, LVM. Routing for additional attempts.   Copied from CRM [id number]. Topic: Clinical - Pink Word Triage >> Oct 27, 2023  9:32 AM Laurier C wrote: Reason for Triage: Patient has a sore in her nose and has some swelling. Patient says it did cause some issues with her breathing her call back # is [phone]. [facility] [place]

## 2023-10-27 NOTE — Telephone Encounter (Signed)
 I can give her bactroban  to use intranasally on sore twice a day for next 5-7 days.

## 2023-10-28 ENCOUNTER — Other Ambulatory Visit: Payer: Self-pay

## 2023-10-28 ENCOUNTER — Telehealth: Payer: Self-pay

## 2023-10-28 MED ORDER — MUPIROCIN 2 % EX OINT: TOPICAL_OINTMENT | CUTANEOUS | 0 refills | Status: DC

## 2023-10-28 NOTE — Telephone Encounter (Signed)
 Pt requested bactroban  [facility] [place] [facility] [place] [facility] per [facility]

## 2023-11-23 ENCOUNTER — Ambulatory Visit (INDEPENDENT_AMBULATORY_CARE_PROVIDER_SITE_OTHER): Payer: PRIVATE HEALTH INSURANCE | Admitting: Physician Assistant

## 2023-11-23 VITALS — BP 94/59 | HR 91 | Ht 61.0 in | Wt 94.7 lb

## 2023-11-23 DIAGNOSIS — F411 Generalized anxiety disorder: Secondary | ICD-10-CM

## 2023-11-23 DIAGNOSIS — Z1159 Encounter for screening for other viral diseases: Secondary | ICD-10-CM

## 2023-11-23 DIAGNOSIS — M545 Low back pain, unspecified: Secondary | ICD-10-CM

## 2023-11-23 DIAGNOSIS — Z0289 Encounter for other administrative examinations: Secondary | ICD-10-CM

## 2023-11-23 DIAGNOSIS — G8929 Other chronic pain: Secondary | ICD-10-CM

## 2023-11-23 DIAGNOSIS — Z131 Encounter for screening for diabetes mellitus: Secondary | ICD-10-CM

## 2023-11-23 DIAGNOSIS — F900 Attention-deficit hyperactivity disorder, predominantly inattentive type: Secondary | ICD-10-CM

## 2023-11-23 DIAGNOSIS — G894 Chronic pain syndrome: Secondary | ICD-10-CM

## 2023-11-23 DIAGNOSIS — Z13220 Encounter for screening for lipoid disorders: Secondary | ICD-10-CM

## 2023-11-23 DIAGNOSIS — Z0000 Encounter for general adult medical examination without abnormal findings: Secondary | ICD-10-CM

## 2023-11-23 NOTE — Progress Notes (Signed)
   Established Patient Office Visit  Subjective   Patient ID: Annette Cook, female    DOB: 16-Sep-1985  Age: 38 y.o. MRN: [id number]  Chief Complaint  Patient presents with   pain contract    HPI Pt is a 38 yo female who presents to the clinic to update her pain contract. She takes norco as needed no more than 30 tablets in a month for chronic right sided low back pain and intermittent left ovarian cyst. She is doing well. She does not take norco with klonapin. She reports that her mood is good. She has no concerns.    Review of Systems  All other systems reviewed and are negative.     Objective:     BP (!) 94/59 (BP Location: Left Arm, Patient Position: Sitting, Cuff Size: Small)   Pulse 91   Ht 5' 1 (1.549 m)   Wt 94 lb 11.2 oz (43 kg)   SpO2 99%   BMI 17.89 kg/m  BP Readings from Last 3 Encounters:  11/23/23 (!) 94/59  12/05/22 (!) 138/100  09/01/22 (!) 155/110   Wt Readings from Last 3 Encounters:  11/23/23 94 lb 11.2 oz (43 kg)  12/05/22 94 lb 9.6 oz (42.9 kg)  09/01/22 99 lb (44.9 kg)      Physical Exam Constitutional:      Appearance: Normal appearance.  HENT:     Head: Normocephalic.  Cardiovascular:     Rate and Rhythm: Normal rate and regular rhythm.  Pulmonary:     Effort: Pulmonary effort is normal.     Breath sounds: Normal breath sounds.  Musculoskeletal:     Right lower leg: No edema.     Left lower leg: No edema.  Neurological:     General: No focal deficit present.     Mental Status: She is alert and oriented to person, place, and time.  Psychiatric:        Mood and Affect: Mood normal.        Assessment & Plan:  SABRASABRAMassiel was seen today for pain contract.  Diagnoses and all orders for this visit:  Pain management contract agreement  Chronic pain syndrome  Chronic right-sided low back pain without sciatica  Routine physical examination -     Lipid panel -     CMP14+EGFR -     CBC w/Diff/Platelet -     Hepatitis C  Antibody -     TSH + free T4  Screening for lipid disorders -     Lipid panel  Screening for diabetes mellitus -     CMP14+EGFR  Encounter for hepatitis C screening test for low risk patient -     Hepatitis C Antibody  Attention deficit hyperactivity disorder (ADHD), predominantly inattentive type  Generalized anxiety disorder   Pt is due for fasting labs, ordered today Pain contract signed for norco no more than 30 a month but patient is not using 30 so we may decrease in the near future and have patient call in for refills instead of just sending over .SABRAPDMP reviewed during this encounter. No refills needed today Follow up in 3 months    Return in about 3 months (around 02/23/2024).    Beacher Every, PA-C

## 2023-11-24 ENCOUNTER — Encounter: Payer: Self-pay | Admitting: Physician Assistant

## 2023-12-07 ENCOUNTER — Other Ambulatory Visit: Payer: Self-pay | Admitting: Physician Assistant

## 2023-12-07 DIAGNOSIS — R253 Fasciculation: Secondary | ICD-10-CM

## 2024-03-16 ENCOUNTER — Other Ambulatory Visit: Payer: Self-pay | Admitting: Physician Assistant

## 2024-03-16 DIAGNOSIS — K219 Gastro-esophageal reflux disease without esophagitis: Secondary | ICD-10-CM

## 2024-04-12 ENCOUNTER — Other Ambulatory Visit: Payer: Self-pay | Admitting: Physician Assistant

## 2024-04-12 DIAGNOSIS — R253 Fasciculation: Secondary | ICD-10-CM

## 2024-04-12 MED ORDER — QUETIAPINE FUMARATE 50 MG PO TABS: 50.0000 mg | ORAL_TABLET | Freq: Every day | ORAL | 1 refills | Status: AC

## 2024-04-12 MED ORDER — TIZANIDINE HCL 4 MG PO TABS: ORAL_TABLET | ORAL | 1 refills | Status: AC

## 2024-04-12 NOTE — Telephone Encounter (Signed)
 Copied from CRM [id number]. Topic: Clinical - Medication Refill >> Apr 12, 2024  9:16 AM Rosaria A wrote: Medication: tiZANidine  (ZANAFLEX ) 4 MG tablet QUEtiapine  (SEROQUEL ) 50 MG tablet  Has the patient contacted their pharmacy? No   This is the patient's preferred pharmacy:  [facility] - [place], [place] - [address] [place] [address] Phone: [phone] Fax: [phone]   Is this the correct pharmacy for this prescription? Yes   Has the prescription been filled recently? No  Is the patient out of the medication? Yes. Patient is requesting 6 month supply.   Has the patient been seen for an appointment in the last year OR does the patient have an upcoming appointment? Yes  Can we respond through MyChart? No. Requesting a phone call.   Agent: Please be advised that Rx refills may take up to 3 business days. We ask that you follow-up with your pharmacy.

## 2024-04-26 ENCOUNTER — Encounter: Payer: Self-pay | Admitting: Physician Assistant

## 2024-04-26 ENCOUNTER — Ambulatory Visit: Payer: PRIVATE HEALTH INSURANCE | Admitting: Physician Assistant

## 2024-04-26 VITALS — BP 126/84 | HR 101 | Ht 61.0 in | Wt 102.0 lb

## 2024-04-26 DIAGNOSIS — F411 Generalized anxiety disorder: Secondary | ICD-10-CM | POA: Diagnosis not present

## 2024-04-26 DIAGNOSIS — B009 Herpesviral infection, unspecified: Secondary | ICD-10-CM | POA: Diagnosis not present

## 2024-04-26 DIAGNOSIS — Z0289 Encounter for other administrative examinations: Secondary | ICD-10-CM | POA: Diagnosis not present

## 2024-04-26 DIAGNOSIS — R110 Nausea: Secondary | ICD-10-CM

## 2024-04-26 DIAGNOSIS — F900 Attention-deficit hyperactivity disorder, predominantly inattentive type: Secondary | ICD-10-CM

## 2024-04-26 DIAGNOSIS — F5101 Primary insomnia: Secondary | ICD-10-CM

## 2024-04-26 DIAGNOSIS — K219 Gastro-esophageal reflux disease without esophagitis: Secondary | ICD-10-CM

## 2024-04-26 DIAGNOSIS — B001 Herpesviral vesicular dermatitis: Secondary | ICD-10-CM | POA: Diagnosis not present

## 2024-04-26 DIAGNOSIS — G894 Chronic pain syndrome: Secondary | ICD-10-CM | POA: Diagnosis not present

## 2024-04-26 MED ORDER — QUETIAPINE FUMARATE 100 MG PO TABS: 100.0000 mg | ORAL_TABLET | Freq: Every day | ORAL | 1 refills | Status: AC

## 2024-04-26 MED ORDER — ATOMOXETINE HCL 60 MG PO CAPS: 60.0000 mg | ORAL_CAPSULE | Freq: Every day | ORAL | 4 refills | Status: AC

## 2024-04-26 MED ORDER — VALACYCLOVIR HCL 500 MG PO TABS: 500.0000 mg | ORAL_TABLET | Freq: Every day | ORAL | 3 refills | Status: AC

## 2024-04-26 MED ORDER — HYDROCODONE-ACETAMINOPHEN 5-325 MG PO TABS: ORAL_TABLET | ORAL | 0 refills | Status: AC

## 2024-04-26 NOTE — Progress Notes (Signed)
 "  Established Patient Office Visit  Subjective   Patient ID: Annette Cook, female    DOB: 01/25/1986  Age: 39 y.o. MRN: [id number]  Chief Complaint  Patient presents with   Medical Management of Chronic Issues    HPI .SABRADiscussed the use of AI scribe software for clinical note transcription with the patient, who gave verbal consent to proceed.  History of Present Illness Annette Cook is a 39 year old female who presents for signing a pain contract and medication management.  Chronic pain - Managing chronic intermittent severe abdominal pain with Norco as needed. - Currently using less Norco than previously prescribed. She takes as needed maybe once a week.   Sleep disturbance - Taking Seroquel  50 mg, two tablets at night, for sleep. - Expresses desire to discontinue Seroquel  due to concerns about its psychiatric effects. She believes it is very effective though. She just does not like taking medication.   Impulse control - Taking Strattera  for impulse control and focus.  - No concerns or side effects doing well.   Gastrointestinal symptoms - Taking Protonix .  Herpetic lesions - Uses Valtrex  as needed for fever blisters.  Anxiety and panic attacks - Experiences panic attacks, most recently triggered by work-related stress. - Physical symptoms during panic attacks include muscle tension. - Using Klonopin  less frequently for anxiety and panic attacks, maybe once a week.   Occupational stress - Significant work-related stress due to toxic environment and difficulty handling criticism. - Recently called into the office for attitude and behavior issues. - Desires to leave current job despite recent promotion and raise. - Engages in running and meditation to manage stress and anger.  Dietary habits - Increasing protein intake.    ROS See HPI.    Objective:     BP 126/84   Pulse (!) 101   Ht 5' 1 (1.549 m)   Wt 102 lb (46.3 kg)   SpO2 99%   BMI 19.27 kg/m   BP Readings from Last 3 Encounters:  04/26/24 126/84  11/23/23 (!) 94/59  12/05/22 (!) 138/100   Wt Readings from Last 3 Encounters:  04/26/24 102 lb (46.3 kg)  11/23/23 94 lb 11.2 oz (43 kg)  12/05/22 94 lb 9.6 oz (42.9 kg)      Physical Exam HENT:     Head: Normocephalic.  Cardiovascular:     Rate and Rhythm: Normal rate.  Pulmonary:     Effort: Pulmonary effort is normal.  Neurological:     General: No focal deficit present.     Mental Status: She is alert and oriented to person, place, and time.  Psychiatric:     Comments: Tearful at times.       The 10-year ASCVD risk score (Arnett DK, et al., 2019) is: 0.2%    Assessment & Plan:  .Halo was seen today for medical management of chronic issues.  Diagnoses and all orders for this visit:  Chronic pain syndrome -     HYDROcodone -acetaminophen  (NORCO/VICODIN) 5-325 MG tablet; Take 1 tablet by mouth daily as needed for moderate pain  Pain management contract agreement  Attention deficit hyperactivity disorder (ADHD), predominantly inattentive type -     atomoxetine  (STRATTERA ) 60 MG capsule; Take 1 capsule (60 mg total) by mouth daily.  Gastroesophageal reflux disease, unspecified whether esophagitis present  Generalized anxiety disorder  HSV infection -     valACYclovir  (VALTREX ) 500 MG tablet; Take 1 tablet (500 mg total) by mouth daily.  Nausea  Primary insomnia -  QUEtiapine  (SEROQUEL ) 100 MG tablet; Take 1 tablet (100 mg total) by mouth at bedtime.  Recurrent cold sores    Assessment & Plan Chronic pain syndrome Prefers non-medication management when possible. - Prescribed Norco 30 tablets for 90 days. - Advised to schedule appointments within 90 days for refills. - Pain contract signed today.  SABRA.PDMP reviewed during this encounter. No concerns   Generalized anxiety disorder with panic attacks Experiences panic attacks in stressful work situations. Prefers non-medication management. Uses  Klonopin  less frequently. - Continue Klonopin  as needed for panic attacks. No refill needed today.   Insomnia Managed with Seroquel , effective but prefers not long-term. - Prescribed Seroquel  100 mg for sleep. - Encouraged with no side effects to take nightly.   Attention-deficit hyperactivity disorder Managed with Strattera , effective for impulse control and focus. - Continue Strattera  as prescribed.  Gastroesophageal reflux disease Managed with Protonix . - Continue Protonix  as prescribed.  Herpes labialis Managed with Valtrex  as needed. - Continue Valtrex  as needed for fever blisters.     Return in about 3 months (around 07/25/2024).    Kupono Marling, PA-C  "
